# Patient Record
Sex: Female | Born: 1945 | Race: White | Hispanic: No | Marital: Married | Smoking: Current every day smoker
Health system: Southern US, Community
[De-identification: ages and names within clinical notes are randomized; demographics above are authoritative.]

## PROBLEM LIST (undated history)

## (undated) DIAGNOSIS — M1990 Unspecified osteoarthritis, unspecified site: Secondary | ICD-10-CM

## (undated) DIAGNOSIS — M545 Low back pain, unspecified: Secondary | ICD-10-CM

## (undated) DIAGNOSIS — I10 Essential (primary) hypertension: Secondary | ICD-10-CM

## (undated) DIAGNOSIS — I340 Nonrheumatic mitral (valve) insufficiency: Secondary | ICD-10-CM

## (undated) DIAGNOSIS — R599 Enlarged lymph nodes, unspecified: Secondary | ICD-10-CM

## (undated) DIAGNOSIS — F32A Depression, unspecified: Secondary | ICD-10-CM

## (undated) DIAGNOSIS — M5430 Sciatica, unspecified side: Secondary | ICD-10-CM

## (undated) DIAGNOSIS — Z720 Tobacco use: Secondary | ICD-10-CM

## (undated) DIAGNOSIS — I2510 Atherosclerotic heart disease of native coronary artery without angina pectoris: Secondary | ICD-10-CM

## (undated) DIAGNOSIS — E785 Hyperlipidemia, unspecified: Secondary | ICD-10-CM

## (undated) DIAGNOSIS — J449 Chronic obstructive pulmonary disease, unspecified: Secondary | ICD-10-CM

## (undated) DIAGNOSIS — K219 Gastro-esophageal reflux disease without esophagitis: Secondary | ICD-10-CM

## (undated) DIAGNOSIS — F329 Major depressive disorder, single episode, unspecified: Secondary | ICD-10-CM

## (undated) DIAGNOSIS — N3941 Urge incontinence: Secondary | ICD-10-CM

## (undated) DIAGNOSIS — C50919 Malignant neoplasm of unspecified site of unspecified female breast: Secondary | ICD-10-CM

## (undated) DIAGNOSIS — I509 Heart failure, unspecified: Secondary | ICD-10-CM

## (undated) DIAGNOSIS — I5020 Unspecified systolic (congestive) heart failure: Secondary | ICD-10-CM

## (undated) DIAGNOSIS — I6529 Occlusion and stenosis of unspecified carotid artery: Secondary | ICD-10-CM

## (undated) HISTORY — DX: Low back pain: M54.5

## (undated) HISTORY — DX: Unspecified systolic (congestive) heart failure: I50.20

## (undated) HISTORY — DX: Depression, unspecified: F32.A

## (undated) HISTORY — DX: Urge incontinence: N39.41

## (undated) HISTORY — DX: Nonrheumatic mitral (valve) insufficiency: I34.0

## (undated) HISTORY — DX: Malignant neoplasm of unspecified site of unspecified female breast: C50.919

## (undated) HISTORY — DX: Enlarged lymph nodes, unspecified: R59.9

## (undated) HISTORY — DX: Tobacco use: Z72.0

## (undated) HISTORY — PX: CHOLECYSTECTOMY: SHX55

## (undated) HISTORY — PX: CAROTID ENDARTERECTOMY: SUR193

## (undated) HISTORY — DX: Sciatica, unspecified side: M54.30

## (undated) HISTORY — DX: Unspecified osteoarthritis, unspecified site: M19.90

## (undated) HISTORY — DX: Atherosclerotic heart disease of native coronary artery without angina pectoris: I25.10

## (undated) HISTORY — DX: Hyperlipidemia, unspecified: E78.5

## (undated) HISTORY — PX: APPENDECTOMY: SHX54

## (undated) HISTORY — DX: Gastro-esophageal reflux disease without esophagitis: K21.9

## (undated) HISTORY — DX: Chronic obstructive pulmonary disease, unspecified: J44.9

## (undated) HISTORY — DX: Low back pain, unspecified: M54.50

## (undated) HISTORY — DX: Major depressive disorder, single episode, unspecified: F32.9

## (undated) HISTORY — PX: OTHER SURGICAL HISTORY: SHX169

## (undated) HISTORY — DX: Essential (primary) hypertension: I10

## (undated) HISTORY — PX: CERVICAL DISC SURGERY: SHX588

## (undated) HISTORY — DX: Occlusion and stenosis of unspecified carotid artery: I65.29

---

## 1990-06-08 HISTORY — PX: MASTECTOMY: SHX3

## 1993-06-08 HISTORY — PX: BACK SURGERY: SHX140

## 2007-07-31 ENCOUNTER — Ambulatory Visit: Payer: Self-pay | Admitting: Family Medicine

## 2007-07-31 ENCOUNTER — Observation Stay (HOSPITAL_COMMUNITY): Admission: EM | Admit: 2007-07-31 | Discharge: 2007-08-02 | Payer: Self-pay | Admitting: Emergency Medicine

## 2007-07-31 LAB — CONVERTED CEMR LAB: Pro B Natriuretic peptide (BNP): 902 pg/mL

## 2007-08-01 ENCOUNTER — Encounter (INDEPENDENT_AMBULATORY_CARE_PROVIDER_SITE_OTHER): Payer: Self-pay | Admitting: Emergency Medicine

## 2007-08-01 LAB — CONVERTED CEMR LAB
Cholesterol: 166 mg/dL
HIV: NEGATIVE
Triglycerides: 67 mg/dL

## 2007-08-02 LAB — CONVERTED CEMR LAB
Creatinine, Ser: 0.8 mg/dL
Sodium: 138 meq/L

## 2007-08-03 ENCOUNTER — Encounter: Payer: Self-pay | Admitting: Family Medicine

## 2007-08-10 ENCOUNTER — Telehealth: Payer: Self-pay | Admitting: *Deleted

## 2007-09-02 ENCOUNTER — Ambulatory Visit: Payer: Self-pay | Admitting: Family Medicine

## 2007-09-02 ENCOUNTER — Telehealth (INDEPENDENT_AMBULATORY_CARE_PROVIDER_SITE_OTHER): Payer: Self-pay | Admitting: *Deleted

## 2007-09-02 DIAGNOSIS — E785 Hyperlipidemia, unspecified: Secondary | ICD-10-CM | POA: Insufficient documentation

## 2007-09-02 DIAGNOSIS — J449 Chronic obstructive pulmonary disease, unspecified: Secondary | ICD-10-CM

## 2007-09-02 DIAGNOSIS — I10 Essential (primary) hypertension: Secondary | ICD-10-CM

## 2007-09-02 DIAGNOSIS — N3941 Urge incontinence: Secondary | ICD-10-CM

## 2007-09-02 DIAGNOSIS — J4489 Other specified chronic obstructive pulmonary disease: Secondary | ICD-10-CM | POA: Insufficient documentation

## 2007-09-02 DIAGNOSIS — I5032 Chronic diastolic (congestive) heart failure: Secondary | ICD-10-CM | POA: Insufficient documentation

## 2007-09-04 ENCOUNTER — Encounter: Payer: Self-pay | Admitting: Family Medicine

## 2007-09-04 DIAGNOSIS — K219 Gastro-esophageal reflux disease without esophagitis: Secondary | ICD-10-CM

## 2007-09-04 DIAGNOSIS — M545 Low back pain: Secondary | ICD-10-CM | POA: Insufficient documentation

## 2007-09-04 DIAGNOSIS — I5043 Acute on chronic combined systolic (congestive) and diastolic (congestive) heart failure: Secondary | ICD-10-CM | POA: Insufficient documentation

## 2007-09-04 DIAGNOSIS — F329 Major depressive disorder, single episode, unspecified: Secondary | ICD-10-CM

## 2007-09-04 DIAGNOSIS — M1990 Unspecified osteoarthritis, unspecified site: Secondary | ICD-10-CM | POA: Insufficient documentation

## 2007-09-04 DIAGNOSIS — I509 Heart failure, unspecified: Secondary | ICD-10-CM | POA: Insufficient documentation

## 2007-12-30 ENCOUNTER — Encounter: Payer: Self-pay | Admitting: Family Medicine

## 2007-12-30 ENCOUNTER — Ambulatory Visit: Payer: Self-pay | Admitting: Family Medicine

## 2008-01-02 LAB — CONVERTED CEMR LAB
AST: 13 units/L (ref 0–37)
Alkaline Phosphatase: 77 units/L (ref 39–117)
CO2: 23 meq/L (ref 19–32)
Chloride: 103 meq/L (ref 96–112)
Direct LDL: 94 mg/dL
Glucose, Bld: 97 mg/dL (ref 70–99)
Potassium: 3.8 meq/L (ref 3.5–5.3)
Sodium: 139 meq/L (ref 135–145)
Total Bilirubin: 0.3 mg/dL (ref 0.3–1.2)

## 2008-01-03 ENCOUNTER — Telehealth: Payer: Self-pay | Admitting: Family Medicine

## 2008-02-03 ENCOUNTER — Telehealth (INDEPENDENT_AMBULATORY_CARE_PROVIDER_SITE_OTHER): Payer: Self-pay | Admitting: *Deleted

## 2008-06-06 ENCOUNTER — Encounter: Payer: Self-pay | Admitting: Family Medicine

## 2008-07-19 ENCOUNTER — Ambulatory Visit: Payer: Self-pay | Admitting: Cardiovascular Disease

## 2008-07-19 ENCOUNTER — Inpatient Hospital Stay (HOSPITAL_COMMUNITY): Admission: EM | Admit: 2008-07-19 | Discharge: 2008-07-21 | Payer: Self-pay | Admitting: Emergency Medicine

## 2008-10-10 ENCOUNTER — Encounter: Payer: Self-pay | Admitting: *Deleted

## 2009-02-15 ENCOUNTER — Encounter: Payer: Self-pay | Admitting: Cardiovascular Disease

## 2009-02-18 ENCOUNTER — Encounter: Payer: Self-pay | Admitting: Cardiovascular Disease

## 2009-02-20 ENCOUNTER — Encounter: Payer: Self-pay | Admitting: Cardiovascular Disease

## 2009-02-20 ENCOUNTER — Ambulatory Visit: Payer: Self-pay | Admitting: Vascular Surgery

## 2009-02-23 ENCOUNTER — Encounter (INDEPENDENT_AMBULATORY_CARE_PROVIDER_SITE_OTHER): Payer: Self-pay | Admitting: *Deleted

## 2009-02-23 DIAGNOSIS — Z720 Tobacco use: Secondary | ICD-10-CM | POA: Insufficient documentation

## 2009-03-05 ENCOUNTER — Ambulatory Visit: Payer: Self-pay | Admitting: Vascular Surgery

## 2009-03-05 ENCOUNTER — Encounter: Payer: Self-pay | Admitting: Vascular Surgery

## 2009-03-05 ENCOUNTER — Inpatient Hospital Stay (HOSPITAL_COMMUNITY): Admission: RE | Admit: 2009-03-05 | Discharge: 2009-03-06 | Payer: Self-pay | Admitting: Vascular Surgery

## 2009-03-05 IMAGING — CR DG CHEST 1V PORT
1 series · 1 of 1 positions shown · non-contrast
Comparison: None.

CLINICAL DATA: Shortness of breath. 
 PORTABLE CHEST - 1 VIEW:

[AP]
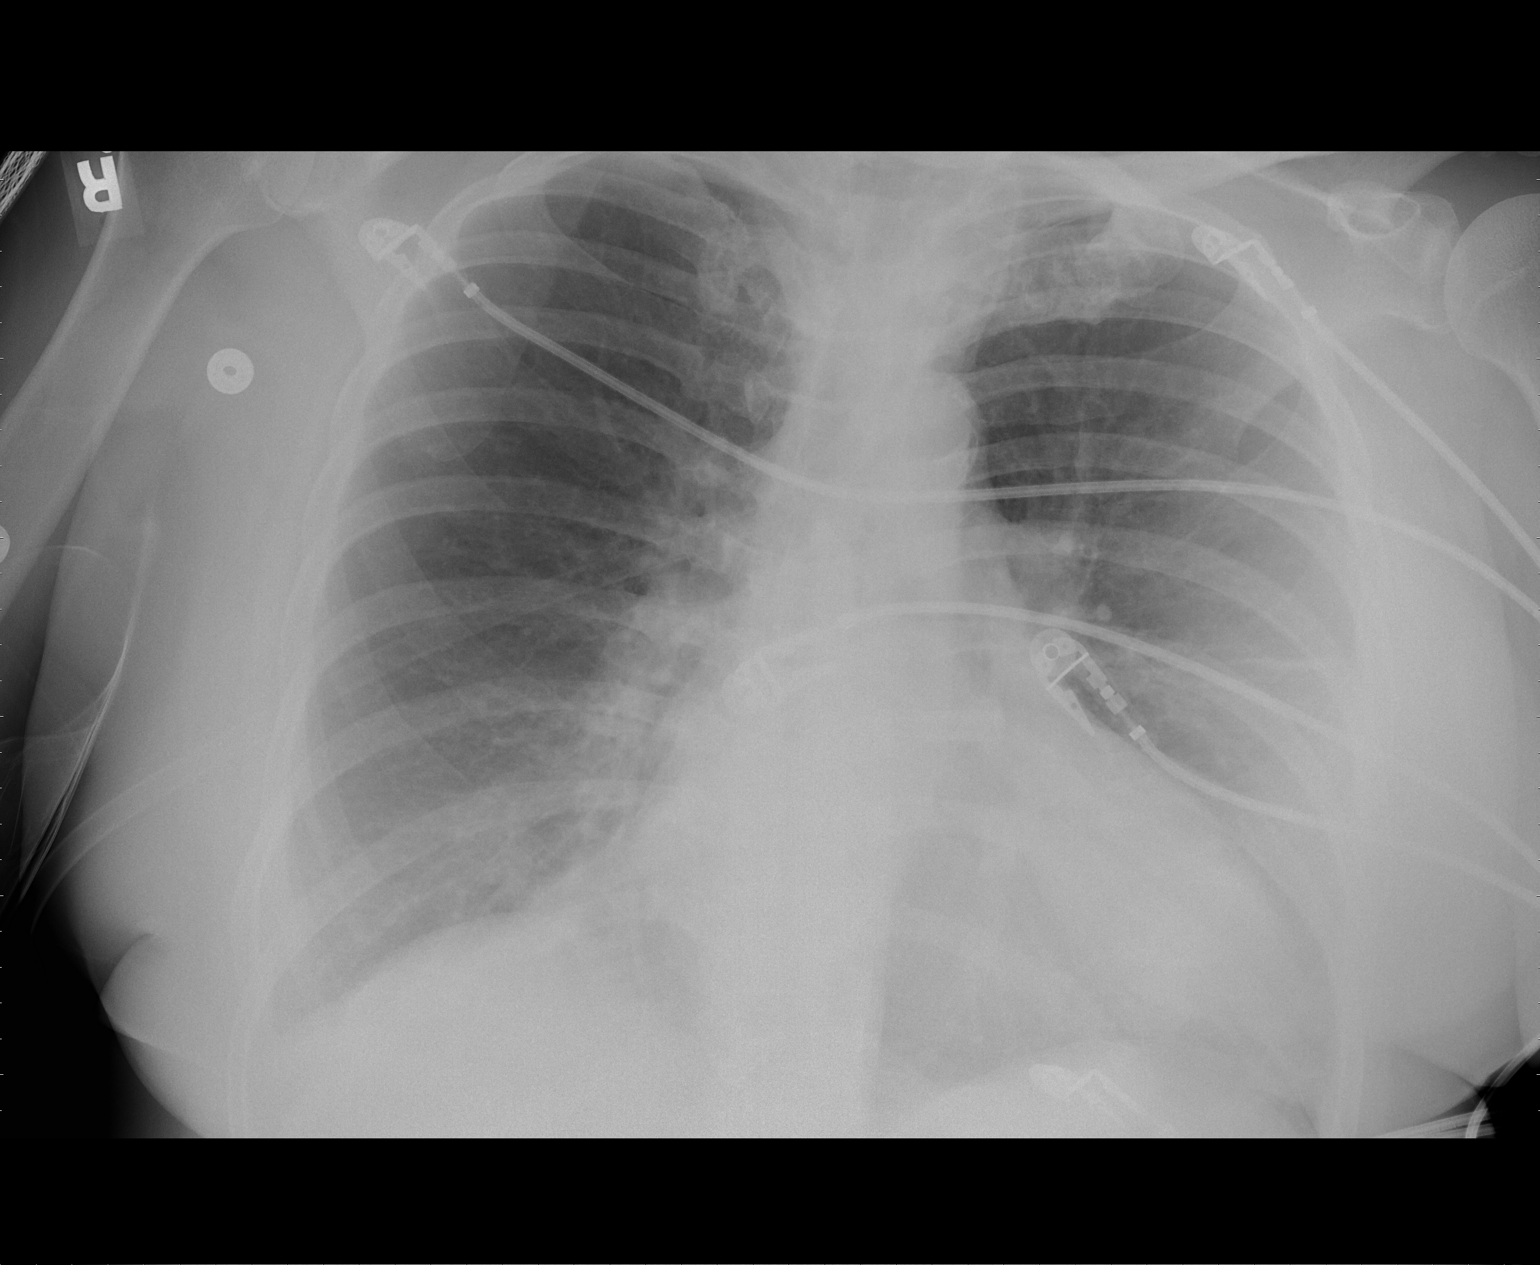

[1 of 1 positions shown; findings below may reference images not displayed]

FINDINGS: The heart is enlarged.  There is mild pulmonary edema and probable small effusions. No definite infiltrates.
IMPRESSION: Mild CHF.

## 2009-03-05 IMAGING — CT CT ANGIO CHEST
2 of 5 series · 19 of 36 positions shown · IV contrast (APPLIED)
Comparison: none

CLINICAL DATA: Fever.  Shortness of breath.  Reason for exam:  Query PE.  
CT ANGIOGRAPHY OF CHEST:
TECHNIQUE: Multidetector CT imaging of the chest was performed during bolus injection of intravenous contrast.  Multiplanar CT angiographic image reconstructions were generated to evaluate the vascular anatomy.
Contrast:  100 cc Omnipaque 300

[Series 7: pulm embolism 1.0 thins · axial · 0.63mm/px · z∈[+1121,+1357]mm · 16 of 264 slices shown]
[im 14/264  lung]
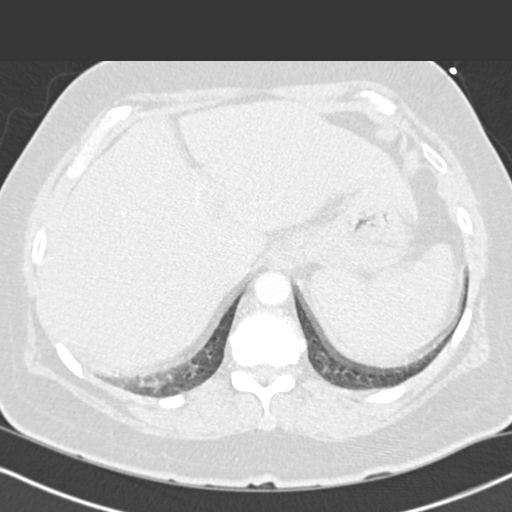
[im 28/264  mediastinal]
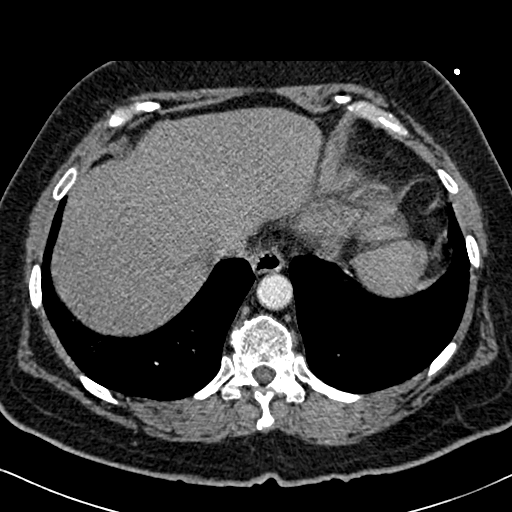
[im 42/264  lung]
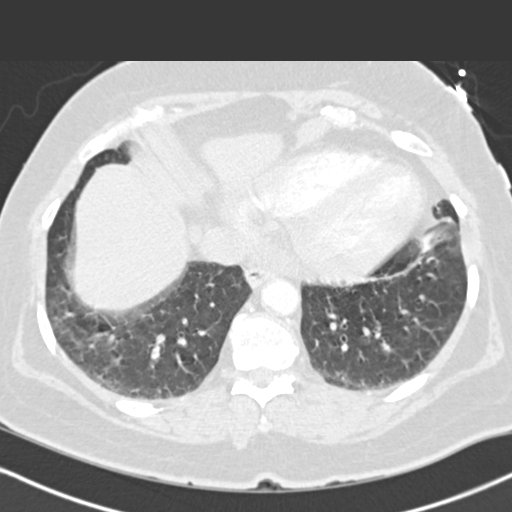
[im 56/264  mediastinal]
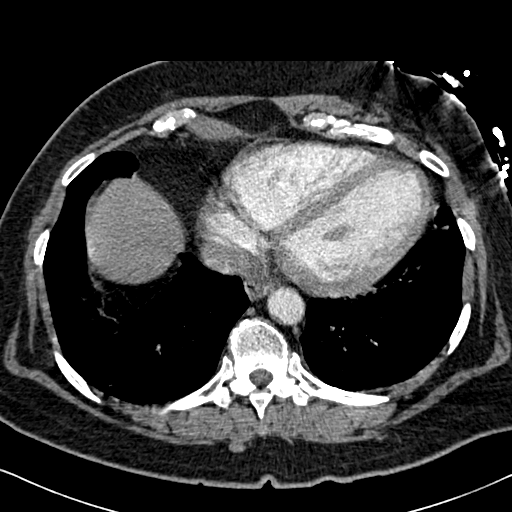
[im 84/264  lung]
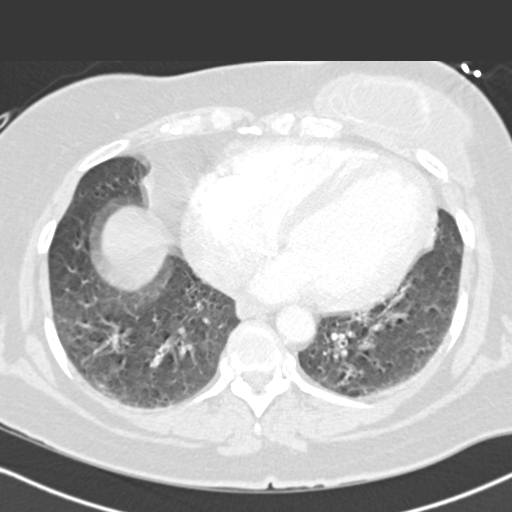
[im 97/264  mediastinal]
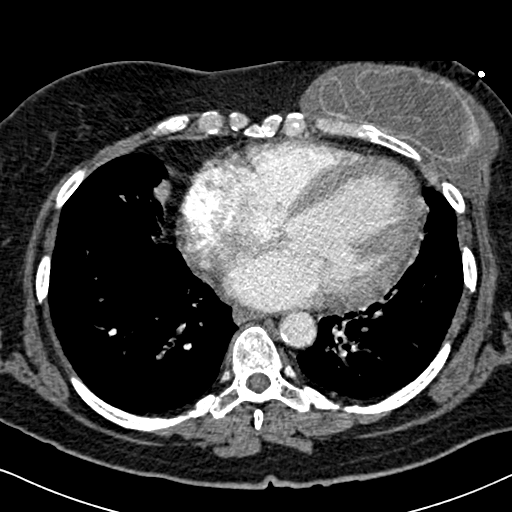
[im 111/264  lung]
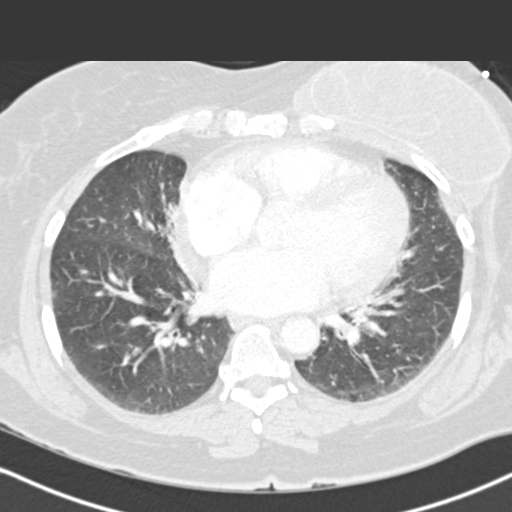
[im 125/264  mediastinal]
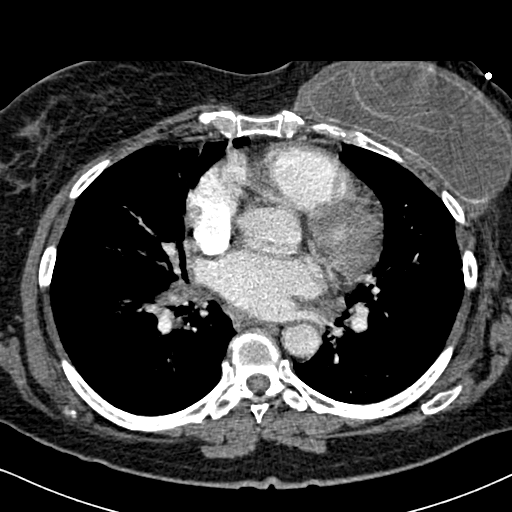
[im 139/264  lung]
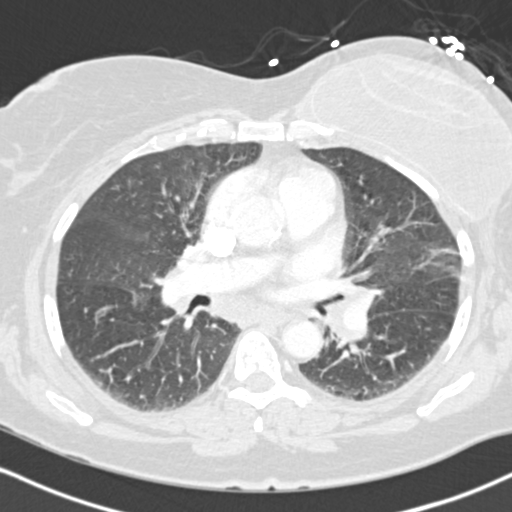
[im 153/264  mediastinal]
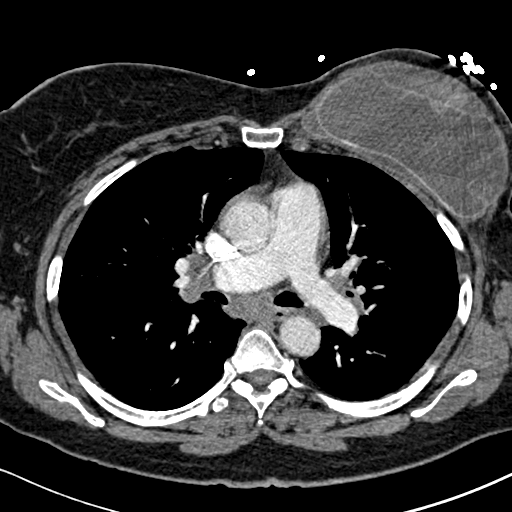
[im 167/264  lung]
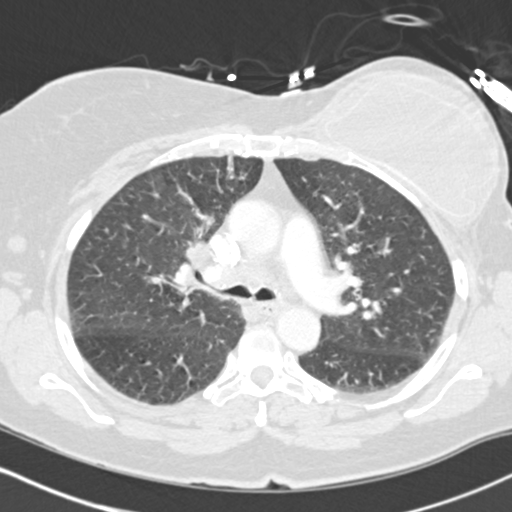
[im 180/264  mediastinal]
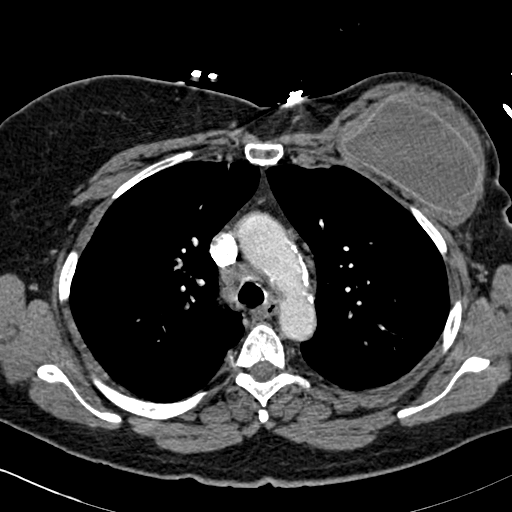
[im 208/264  lung]
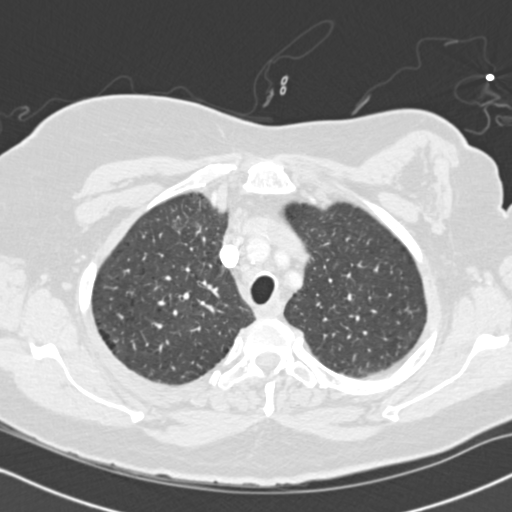
[im 222/264  mediastinal]
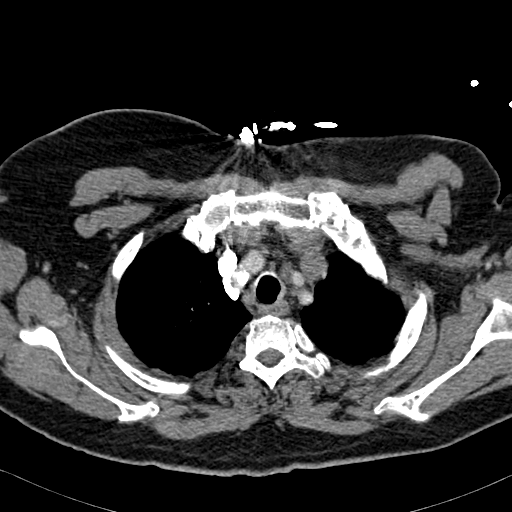
[im 236/264  lung]
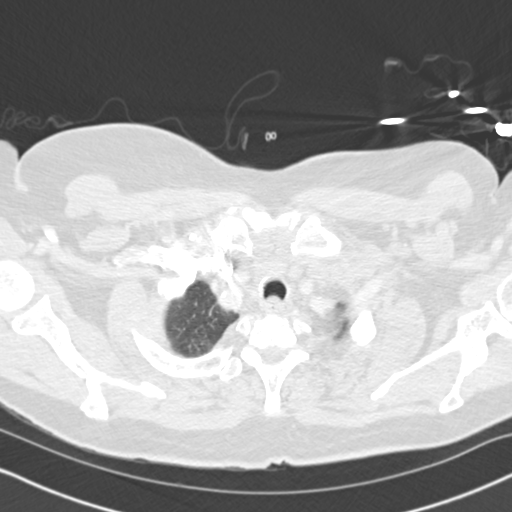
[im 250/264  mediastinal]
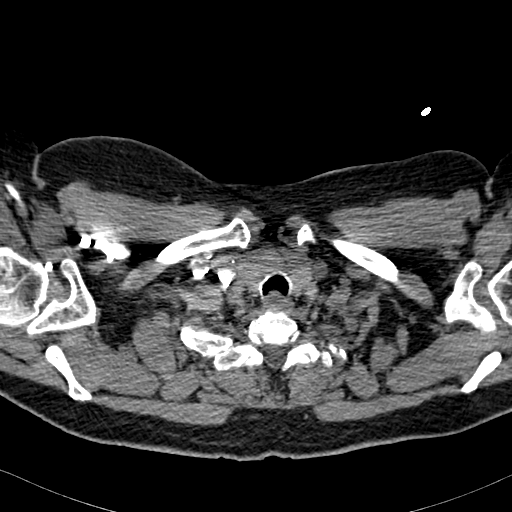

[Series 9: pulm embolism 2.0 cor · coronal · 0.67mm/px · 3 of 124 slices shown]
[im 25/124  mediastinal]
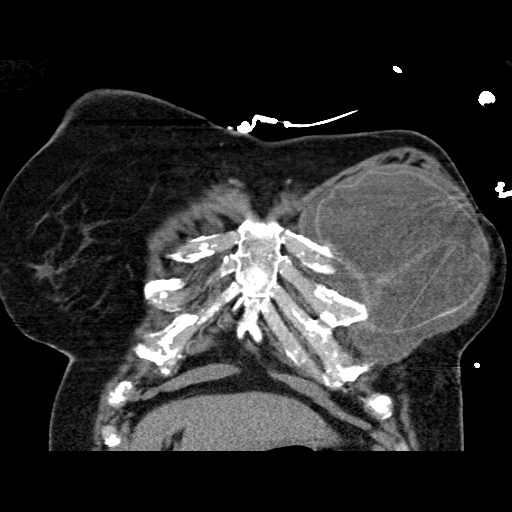
[im 50/124  mediastinal]
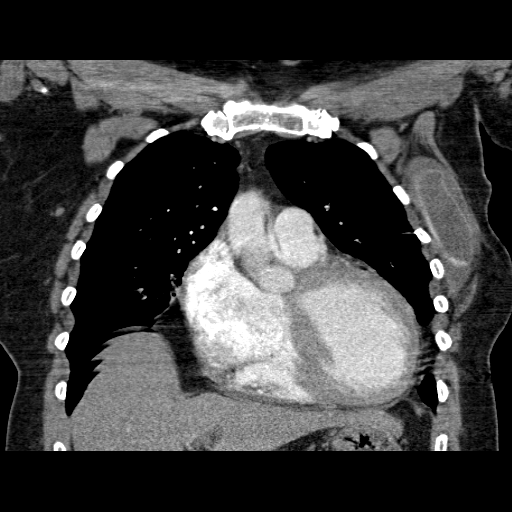
[im 74/124  mediastinal]
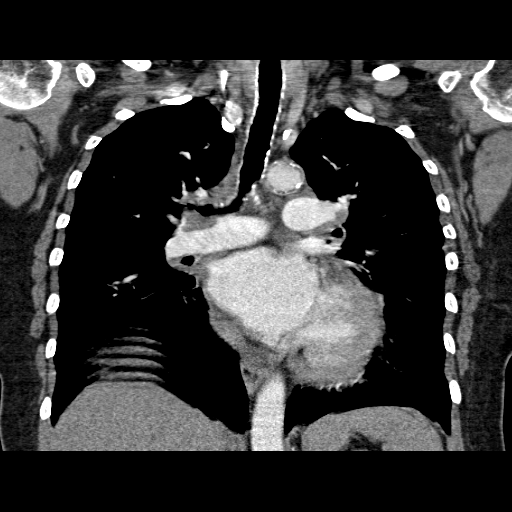

[19 of 36 positions shown; findings below may reference images not displayed]

FINDINGS: Negative for PE.  Left breast implant with findings consistent with intracapsular rupture.  Cardiomegaly.  No pericardial or pleural effusions.  Linear scarring versus subsegmental atelectasis in the right middle lobe.  Mild diffuse increase in interstitial/peribronchial markings.  No focal infiltrate or consolidation.  Mediastinal and bihilar adenopathy, of undetermined etiology.  Sarcoidosis would be a consideration.  Slightly prominent sized nodes are noted in the superior mediastinum, right paratracheal region and precarinal, subcarinal, and prevascular territories.  Just lateral to the aortopulmonary window there is adenopathy involving the prevascular region.  There are two adjacent nodes the largest having a short axis diameter of 1 cm.  There is probably a cluster of prominent nodes in the subcarinal region.
IMPRESSION: Negative for PE.  Accentuated pulmonary interstitial/peribronchial markings.  No focal infiltrate or consolidation.  Mediastinal and bihilar adenopathy.  Abnormal appearing left breast prosthesis.  Findings suggesting intracapsular rupture.

## 2009-03-20 ENCOUNTER — Ambulatory Visit: Payer: Self-pay | Admitting: Vascular Surgery

## 2009-03-20 ENCOUNTER — Encounter: Payer: Self-pay | Admitting: Cardiovascular Disease

## 2009-03-26 ENCOUNTER — Ambulatory Visit: Payer: Self-pay | Admitting: Cardiovascular Disease

## 2009-03-26 DIAGNOSIS — I340 Nonrheumatic mitral (valve) insufficiency: Secondary | ICD-10-CM | POA: Insufficient documentation

## 2009-03-26 DIAGNOSIS — R0989 Other specified symptoms and signs involving the circulatory and respiratory systems: Secondary | ICD-10-CM

## 2009-03-26 DIAGNOSIS — I2510 Atherosclerotic heart disease of native coronary artery without angina pectoris: Secondary | ICD-10-CM | POA: Insufficient documentation

## 2009-03-26 DIAGNOSIS — I080 Rheumatic disorders of both mitral and aortic valves: Secondary | ICD-10-CM

## 2009-03-26 LAB — CONVERTED CEMR LAB: Sodium: 132 meq/L — ABNORMAL LOW (ref 135–145)

## 2009-04-29 ENCOUNTER — Ambulatory Visit: Payer: Self-pay | Admitting: Pulmonary Disease

## 2009-04-29 DIAGNOSIS — R0602 Shortness of breath: Secondary | ICD-10-CM | POA: Insufficient documentation

## 2009-05-02 ENCOUNTER — Encounter (INDEPENDENT_AMBULATORY_CARE_PROVIDER_SITE_OTHER): Payer: Self-pay | Admitting: *Deleted

## 2009-06-26 ENCOUNTER — Ambulatory Visit: Payer: Self-pay | Admitting: Pulmonary Disease

## 2009-06-26 DIAGNOSIS — J438 Other emphysema: Secondary | ICD-10-CM

## 2009-08-08 ENCOUNTER — Encounter (INDEPENDENT_AMBULATORY_CARE_PROVIDER_SITE_OTHER): Payer: Self-pay | Admitting: *Deleted

## 2009-08-28 ENCOUNTER — Ambulatory Visit: Payer: Self-pay | Admitting: Pulmonary Disease

## 2009-09-12 ENCOUNTER — Ambulatory Visit: Payer: Self-pay | Admitting: Cardiovascular Disease

## 2009-09-25 ENCOUNTER — Ambulatory Visit: Payer: Self-pay | Admitting: Vascular Surgery

## 2009-10-08 ENCOUNTER — Telehealth: Payer: Self-pay | Admitting: Cardiovascular Disease

## 2009-10-31 ENCOUNTER — Inpatient Hospital Stay (HOSPITAL_COMMUNITY): Admission: EM | Admit: 2009-10-31 | Discharge: 2009-11-02 | Payer: Self-pay | Admitting: Emergency Medicine

## 2009-11-13 ENCOUNTER — Encounter: Payer: Self-pay | Admitting: Cardiovascular Disease

## 2009-11-13 ENCOUNTER — Inpatient Hospital Stay (HOSPITAL_COMMUNITY): Admission: EM | Admit: 2009-11-13 | Discharge: 2009-11-17 | Payer: Self-pay | Admitting: Emergency Medicine

## 2009-11-13 ENCOUNTER — Ambulatory Visit: Payer: Self-pay | Admitting: Cardiology

## 2009-11-14 ENCOUNTER — Encounter (INDEPENDENT_AMBULATORY_CARE_PROVIDER_SITE_OTHER): Payer: Self-pay | Admitting: Internal Medicine

## 2009-11-14 ENCOUNTER — Encounter: Payer: Self-pay | Admitting: Cardiovascular Disease

## 2009-11-15 ENCOUNTER — Encounter (INDEPENDENT_AMBULATORY_CARE_PROVIDER_SITE_OTHER): Payer: Self-pay | Admitting: Internal Medicine

## 2009-12-03 ENCOUNTER — Telehealth: Payer: Self-pay | Admitting: Cardiovascular Disease

## 2010-01-29 ENCOUNTER — Ambulatory Visit: Payer: Self-pay | Admitting: Pulmonary Disease

## 2010-02-12 ENCOUNTER — Telehealth: Payer: Self-pay | Admitting: Cardiovascular Disease

## 2010-02-25 ENCOUNTER — Encounter: Payer: Self-pay | Admitting: Family Medicine

## 2010-04-18 ENCOUNTER — Telehealth: Payer: Self-pay | Admitting: Cardiovascular Disease

## 2010-04-18 ENCOUNTER — Telehealth (INDEPENDENT_AMBULATORY_CARE_PROVIDER_SITE_OTHER): Payer: Self-pay | Admitting: *Deleted

## 2010-05-02 ENCOUNTER — Ambulatory Visit: Payer: Self-pay

## 2010-05-02 ENCOUNTER — Ambulatory Visit: Payer: Self-pay | Admitting: Cardiology

## 2010-05-02 ENCOUNTER — Encounter: Payer: Self-pay | Admitting: Cardiovascular Disease

## 2010-05-02 ENCOUNTER — Ambulatory Visit (HOSPITAL_COMMUNITY): Admission: RE | Admit: 2010-05-02 | Discharge: 2010-05-02 | Payer: Self-pay | Admitting: Cardiovascular Disease

## 2010-05-15 ENCOUNTER — Telehealth: Payer: Self-pay | Admitting: Cardiovascular Disease

## 2010-05-22 ENCOUNTER — Encounter (INDEPENDENT_AMBULATORY_CARE_PROVIDER_SITE_OTHER): Payer: Self-pay | Admitting: *Deleted

## 2010-05-23 ENCOUNTER — Ambulatory Visit: Payer: Self-pay | Admitting: Vascular Surgery

## 2010-05-28 ENCOUNTER — Ambulatory Visit: Payer: Self-pay | Admitting: Cardiovascular Disease

## 2010-05-29 ENCOUNTER — Encounter: Payer: Self-pay | Admitting: Family Medicine

## 2010-06-30 ENCOUNTER — Encounter: Payer: Self-pay | Admitting: Family Medicine

## 2010-07-08 NOTE — Assessment & Plan Note (Signed)
Summary: rov for emphysema   Copy to:  Charlton Haws Primary Provider/Referring Provider:  Lajuana Matte at [facility]  CC:  Pt is here for a 4 month f/u appt on her emphysema. Pt states she has been in the hosp x 2 since she was last seen for dizziness.  Pt denied any complaints with sob.  states she will occ cough up clear to brown colored sputum.  pt is still smoking 1/2 ppd. .  History of Present Illness: The pt comes in today for f/u of her known emphysema.  She has been in the hospital times two for unrelated issues, and her breathing surprisingly has been stable.  She is staying on her bronchodilator regimen, but unfortunately is still smoking.  She has an am cough that is productive of white to discolored mucus, but clears after she starts her day.    Current Medications (verified): 1)  Ventolin Hfa 108 (90 Base) Mcg/act  Aers (Albuterol Sulfate) .... 2 Puffs Q 4hours As Needed Shortness of Breath 2)  Carvedilol 6.25 Mg Tabs (Carvedilol) .... Take One Tablet By Mouth Twice A Day 3)  Lisinopril 20 Mg Tabs (Lisinopril) .... Take One Tablet By Mouth Daily 4)  Aspirin Low Dose 81 Mg Tabs (Aspirin) .... Take 1 Tablet By Mouth Once A Day 5)  Simvastatin 80 Mg Tabs (Simvastatin) .... Take One Tablet By Mouth Daily At Bedtime 6)  Furosemide 40 Mg Tabs (Furosemide) .... Take One Tablet By Mouth Daily. 7)  Cymbalta 20 Mg Cpep (Duloxetine Hcl) .Marland Kitchen.. 1 Tab By Mouth Once Daily 8)  Spiriva Handihaler 18 Mcg  Caps (Tiotropium Bromide Monohydrate) .... One Puff  in Handihaler Daily 9)  Symbicort 160-4.5 Mcg/act  Aero (Budesonide-Formoterol Fumarate) .... Two Puffs Twice Daily 10)  Nitrostat 0.4 Mg Subl (Nitroglycerin) .Marland Kitchen.. 1 Tablet Under Tongue At Onset of Chest Pain; You May Repeat Every 5 Minutes For Up To 3 Doses. 11)  Pantoprazole Sodium 40 Mg Tbec (Pantoprazole Sodium) .... Take 1 Tablet By Mouth Once A Day 12)  Hydrocodone-Acetaminophen 5-500 Mg Tabs (Hydrocodone-Acetaminophen) ....  Take 1 To 2 Tabs By Mouth Every 4 To 6 Hours As Needed  Allergies (verified): No Known Drug Allergies  Social History: LIves with daughter, Aggie Cosier and grandson, Ephriam Knuckles, and her husband Zollie Beckers.   She is unemployed now.  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   Pt is a current smoker.  started at age 89.  1 ppd.  currently down to 1/2 ppd.  Denies alcohol, denies drugs.  Review of Systems       The patient complains of productive cough, weight change, nasal congestion/difficulty breathing through nose, sneezing, itching, and hand/feet swelling.  The patient denies shortness of breath with activity, shortness of breath at rest, non-productive cough, coughing up blood, chest pain, irregular heartbeats, acid heartburn, indigestion, loss of appetite, abdominal pain, difficulty swallowing, sore throat, tooth/dental problems, headaches, ear ache, anxiety, depression, joint stiffness or pain, rash, change in color of mucus, and fever.    Vital Signs:  Patient profile:   65 year old female Height:      67 inches Weight:      173.13 pounds BMI:     27.21 O2 Sat:      98 % on Room air Temp:     99.1 degrees F oral Pulse rate:   61 / minute BP sitting:   120 / 78  (left arm) Cuff size:  large  Vitals Entered By: Arman Filter LPN (January 29, 2010 3:39 PM)  O2 Flow:  Room air CC: Pt is here for a 4 month f/u appt on her emphysema. Pt states she has been in the hosp x 2 since she was last seen for dizziness.  Pt denied any complaints with sob.  states she will occ cough up clear to brown colored sputum.  pt is still smoking 1/2 ppd.  Comments Medications reviewed with patient Arman Filter LPN  January 29, 2010 3:39 PM    Physical Exam  General:  wd female in nad Lungs:  decreased bs throughout, but no wheezing or rhonchi Heart:  rrr Extremities:  no edema noted, no cyanosis Neurologic:  alert and oriented, moves all 4.   Impression &  Recommendations:  Problem # 1:  EMPHYSEMA (ICD-492.8) the pt is being treated with a good bronchodilator regimen, and has not had any worsening in her exertional tolerance since the last visit.  I have stressed to her again the importance of total smoking cessation, and she will try.   She will followup with me in 6mos.   Medications Added to Medication List This Visit: 1)  Aspirin Low Dose 81 Mg Tabs (Aspirin) .... Take 1 tablet by mouth once a day 2)  Pantoprazole Sodium 40 Mg Tbec (Pantoprazole sodium) .... Take 1 tablet by mouth once a day 3)  Hydrocodone-acetaminophen 5-500 Mg Tabs (Hydrocodone-acetaminophen) .... Take 1 to 2 tabs by mouth every 4 to 6 hours as needed 4)  Proair Hfa 108 (90 Base) Mcg/act Aers (Albuterol sulfate) .... 2 puffs every 4-6 hours as needed  Other Orders: Est. Patient Level III (65784)  Patient Instructions: 1)  stop smoking! 2)  continue current breathing medicines, except will change your rescue inhaler to proair (for insurance purposes) 3)  followup with me in 6mos.   Prescriptions: PROAIR HFA 108 (90 BASE) MCG/ACT  AERS (ALBUTEROL SULFATE) 2 puffs every 4-6 hours as needed  #1 x 6   Entered and Authorized by:   Barbaraann Share MD   Signed by:   Barbaraann Share MD on 01/29/2010   Method used:   Print then Give to Patient   RxID:   [id number]

## 2010-07-08 NOTE — Letter (Signed)
Summary: Appointment - Reminder 2  [facility], Main Office  [address]   [place], [place] [address]   Phone: [phone]  Fax: [phone]     August 08, 2009 MRN: [id number]   [facility] [address] [place], [place]  [address]   Dear Ms. Tauer,  Our records indicate that it is time to schedule a follow-up appointment with Dr. Eden Emms. It is very important that we reach you to schedule this appointment. We look forward to participating in your health care needs. Please contact us at the number listed above at your earliest convenience to schedule your appointment.  If you are unable to make an appointment at this time, give [place] a call so we can update our records.     Sincerely,   Migdalia Dk [facility] Scheduling Team

## 2010-07-08 NOTE — Miscellaneous (Signed)
Summary: Orders Update pft charges  Clinical Lists Changes  Orders: Added new Service order of Carbon Monoxide diffusing w/capacity (94720) - Signed Added new Service order of Lung Volumes (94240) - Signed Added new Service order of Spirometry (Pre & Post) (94060) - Signed 

## 2010-07-08 NOTE — Progress Notes (Signed)
Summary: question on meds  Phone Note Call from Patient   Caller: Patient Reason for Call: Talk to Nurse Summary of Call: pt is going to the dentist next week. pt wants to know if she needs to stop her plavix.  Initial call taken by: Roe Coombs,  May 15, 2010 2:50 PM  Follow-up for Phone Call        spoke with pt, she needs to have some teeth extracted at [facility] dental care and needs to know if ok to hold plavix prior to procedure. pt had BMS placed in 07/2008. will foward for dr Eden Emms review Deliah Goody, RN  May 15, 2010 3:29 PM   Additional Follow-up for Phone Call Additional follow up Details #1::        She can hold if if needed Additional Follow-up by: Colon Branch, MD, [facility],  May 16, 2010 9:17 AM    Additional Follow-up for Phone Call Additional follow up Details #2::    pt aware Deliah Goody, RN  May 16, 2010 6:07 PM

## 2010-07-08 NOTE — Assessment & Plan Note (Signed)
Summary: rov for review of pfts   Copy to:  Charlton Haws Primary Provider/Referring Provider:  Lajuana Matte at [facility]  CC:  Pt is here for a f/u appt to discuss PFT results.  Pt is still smoking 1 ppd. .  History of Present Illness: The pt comes in today for f/u of her pfts.  This revealed moderate airflow obstruction, no response to bronchodilators, no restriction, definite airtrapping, and finally moderate reduction in DLCO with correction to normal with alveolar volume adjustment.  I have gone over the study in detail with the patient, and answered all of her questions.  Current Medications (verified): 1)  Ventolin Hfa 108 (90 Base) Mcg/act  Aers (Albuterol Sulfate) .... 2 Puffs Q 4hours As Needed Shortness of Breath 2)  Carvedilol 6.25 Mg Tabs (Carvedilol) .... Take One Tablet By Mouth Twice A Day 3)  Lisinopril 20 Mg Tabs (Lisinopril) .... Take One Tablet By Mouth Daily 4)  Aspirin Ec 325 Mg Tbec (Aspirin) .... Take One Tablet By Mouth Daily 5)  Simvastatin 80 Mg Tabs (Simvastatin) .... Take One Tablet By Mouth Daily At Bedtime 6)  Plavix 75 Mg Tabs (Clopidogrel Bisulfate) .... Take One Tablet By Mouth Daily 7)  Furosemide 40 Mg Tabs (Furosemide) .... Take One Tablet By Mouth Daily. 8)  Cymbalta 20 Mg Cpep (Duloxetine Hcl) .Marland Kitchen.. 1 Tab By Mouth Once Daily 9)  Potassium Chloride Crys Cr 20 Meq Cr-Tabs (Potassium Chloride Crys Cr) .Marland Kitchen.. 1 Tab By Mouth Once Daily  Allergies (verified): No Known Drug Allergies  Vital Signs:  Patient profile:   65 year old female Height:      67 inches Weight:      166 pounds BMI:     26.09 O2 Sat:      99 % on Room air Temp:     98.2 degrees F oral Pulse rate:   58 / minute BP sitting:   118 / 60  (left arm) Cuff size:   regular  Vitals Entered By: Arman Filter LPN (June 26, 2009 3:55 PM)  O2 Flow:  Room air CC: Pt is here for a f/u appt to discuss PFT results.  Pt is still smoking 1 ppd.  Comments Medications reviewed with  patient Arman Filter LPN  June 26, 2009 3:59 PM    Physical Exam  General:  wd female in nad   Impression & Recommendations:  Problem # 1:  EMPHYSEMA (ICD-492.8)  the pt has moderate airflow obstruction that I suspect is due to emphysema and ongoing airway inflammation related to smoking.  I have had a long discussion with her about emphysema, including the role of smoking cessation, bronchodilators, and an exercise regimen.  I would like to start her on spiriva/symbicort, but also told her that no medication in the world will make her better if she continues to smoke.  I have also discussed the possibility of pulmonary rehab, but she has declined.  She will try to walk on a treadmill on her own.  Time spent with pt today was  Medications Added to Medication List This Visit: 1)  Spiriva Handihaler 18 Mcg Caps (Tiotropium bromide monohydrate) .... One puff  in handihaler daily 2)  Symbicort 160-4.5 Mcg/act Aero (Budesonide-formoterol fumarate) .... Two puffs twice daily  Other Orders: Est. Patient Level III (09811)  Patient Instructions: 1)  symbicort 160/4.5  2 inhalations am and pm everyday....rinse mouth well. 2)  spiriva one inhalation every morning only 3)  can use ventolin for emergencies  only 4)  stop smoking 5)  regular exercise at least 3 times a week 6)  followup with me in 2 mos   Prescriptions: SYMBICORT 160-4.5 MCG/ACT  AERO (BUDESONIDE-FORMOTEROL FUMARATE) Two puffs twice daily  #1 x 6   Entered and Authorized by:   Barbaraann Share MD   Signed by:   Barbaraann Share MD on 06/26/2009   Method used:   Print then Give to Patient   RxID:   [id number] SPIRIVA HANDIHALER 18 MCG  CAPS (TIOTROPIUM BROMIDE MONOHYDRATE) one puff  in handihaler daily  #30 x 6   Entered and Authorized by:   Barbaraann Share MD   Signed by:   Barbaraann Share MD on 06/26/2009   Method used:   Print then Give to Patient   RxID:   [id number]    Immunization  History:  Influenza Immunization History:    Influenza:  historical (06/08/2009)  Pneumovax Immunization History:    Pneumovax:  historical (07/10/2007)

## 2010-07-08 NOTE — Progress Notes (Signed)
Summary: when can she go back on plavix  Phone Note Call from Patient   Caller: Patient [phone] Reason for Call: Talk to Nurse Summary of Call: pt wants to know when she can go back on plavix-pls call [phone] Initial call taken by: Glynda Jaeger,  December 03, 2009 2:45 PM

## 2010-07-08 NOTE — Progress Notes (Signed)
Summary: restriction of food,water  Phone Note Call from Patient Call back at Home Phone [phone]   Caller: Patient Reason for Call: Talk to Nurse Summary of Call: per pt calling, pt was told she didn't have any restriction, does that included food, water.  Initial call taken by: Lorne Skeens,  Oct 08, 2009 2:45 PM  Follow-up for Phone Call        spoke with pt, questions answered Deliah Goody, RN  Oct 08, 2009 4:02 PM

## 2010-07-08 NOTE — Progress Notes (Signed)
Summary: talk to nurse  Phone Note From Other Clinic   Caller: Provider Summary of Call: Per Dr [name] her ulcers have healed and wants to know if it is ok for her to restart plavix. ofc [phone] Initial call taken by: [name],  February 12, 2010 4:22 PM  Follow-up for Phone Call        will foward for dr Eden Emms review Carolyn Goody, RN  February 12, 2010 4:41 PM   Additional Follow-up for Phone Call Additional follow up Details #1::        She is a vasculopath with BMS  if no issues with bleeding restart Plavix Additional Follow-up by: Carolyn Branch, MD, [facility],  February 13, 2010 1:27 PM    Additional Follow-up for Phone Call Additional follow up Details #2::    spoke with pt, she is aware to restart plaix Carolyn Goody, RN  February 13, 2010 2:26 PM

## 2010-07-08 NOTE — Progress Notes (Signed)
  Phone Note Outgoing Call   Call placed by: Scherrie Bateman, LPN,  April 18, 2010 8:35 AM Call placed to: Patient Summary of Call: LEFT MESSAGE FOR  PT TO CALL BACK RE APPT ECHO NOT DONE NEEDS TO RESCHEDULE APPT./CY Initial call taken by: Scherrie Bateman, LPN,  April 18, 2010 8:35 AM  Follow-up for Phone Call        Phone call completed PT TO CALL BACK AND RESCHEDULE APPT WITH DR Eden Emms  NEEDS ECHO DONE PRIOR NOT ONE SCHEDULED PREVIOUS TO THIS APPT./CY Follow-up by: Scherrie Bateman, LPN,  April 18, 2010 10:30 AM

## 2010-07-08 NOTE — Letter (Signed)
Summary: Discharge Summary  Discharge Summary   Imported By: Debby Freiberg 12/12/2009 11:44:26  _____________________________________________________________________  External Attachment:    Type:   Image     Comment:   External Document

## 2010-07-08 NOTE — Assessment & Plan Note (Signed)
Summary: 6 month rov/sl   Visit Type:  6 mo f/u Referring Provider:  Charlton Haws Primary Provider:  Lajuana Matte at [facility]  CC:  edema/face/eye..denies any cp or sob.  History of Present Illness: [name] is a previous patient of Dr Elease Hashimoto.   She had a BMS placed to the RCA in February 2010.  I could not find a cath report in echart.  She subsequently had a left CEA by Dr. Darrick Penna in September 2010.  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  she has not tried to quit.  We discussed this at length.  She is willing to try Chantix and already has a script I gave her last time.  She was referred r to pulmonary.  She saw Dr Shelle Iron and was started on Spireva.  PFT;s showed moderate obstruction, moderate decrease in DLCO and no bronchodilater response.   She got a pneumo vax in the hospital but is hesitant to get a flu shot.  She has not had any recurrent SSCP, palpitations, or edema.  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  She has not had a cough or sputum production.    She also appears to have chronic pain syndrome.  She does not work.  CRF include elevated lipids, HTN and smoking.  She has clinical Raynaud's disease with some upper extremity symptoms and blanching of the hands in cold weather. She needs a new script for nitro that we will call into [facility] in [place]    Current Problems (verified): 1)  Emphysema  (ICD-492.8) 2)  Dyspnea  (ICD-786.05) 3)  Hyperlipidemia  (ICD-272.4) 4)  Hypertension  (ICD-401.9) 5)  Congestive Heart Failure  (ICD-428.0) 6)  Congestive Heart Failure, Systolic Dysfunction  (ICD-428.20) 7)  Hypertension, Benign Essential  (ICD-401.1) 8)  Mitral Regurgitation  (ICD-396.3) 9)  Cad  (ICD-414.00) 10)  Carotid Artery Stenosis  (ICD-433.10) 11)  Tobacco User  (ICD-305.1) 12)  Enlargement of Lymph Nodes  (ICD-785.6) 13)  Osteoarthritis  (ICD-715.90) 14)  Low Back Pain  (ICD-724.2) 15)  Gerd  (ICD-530.81) 16)  Depression  (ICD-311) 17)   Breast Cancer, Hx of  (ICD-V10.3) 18)  Urinary Incontinence, Urge  (ICD-788.31) 19)  Sciatica  (ICD-724.3) 20)  Chronic Obstructive Pulmonary Disease  (ICD-496)  Current Medications (verified): 1)  Ventolin Hfa 108 (90 Base) Mcg/act  Aers (Albuterol Sulfate) .... 2 Puffs Q 4hours As Needed Shortness of Breath 2)  Carvedilol 6.25 Mg Tabs (Carvedilol) .... Take One Tablet By Mouth Twice A Day 3)  Lisinopril 20 Mg Tabs (Lisinopril) .... Take One Tablet By Mouth Daily 4)  Aspirin Ec 325 Mg Tbec (Aspirin) .... Take One Tablet By Mouth Daily 5)  Simvastatin 80 Mg Tabs (Simvastatin) .... Take One Tablet By Mouth Daily At Bedtime 6)  Plavix 75 Mg Tabs (Clopidogrel Bisulfate) .... Take One Tablet By Mouth Daily 7)  Furosemide 40 Mg Tabs (Furosemide) .... Take One Tablet By Mouth Daily. 8)  Cymbalta 20 Mg Cpep (Duloxetine Hcl) .Marland Kitchen.. 1 Tab By Mouth Once Daily 9)  Potassium Chloride Crys Cr 20 Meq Cr-Tabs (Potassium Chloride Crys Cr) .Marland Kitchen.. 1 Tab By Mouth Once Daily 10)  Spiriva Handihaler 18 Mcg  Caps (Tiotropium Bromide Monohydrate) .... One Puff  in Handihaler Daily 11)  Symbicort 160-4.5 Mcg/act  Aero (Budesonide-Formoterol Fumarate) .... Two Puffs Twice Daily 12)  Nitrostat 0.4 Mg Subl (Nitroglycerin) .Marland Kitchen.. 1 Tablet Under Tongue At Onset of Chest Pain; You May Repeat Every 5 Minutes For Up To 3 Doses.  Allergies (verified): No Known Drug Allergies  Past History:  Past Medical History: Last updated: 2009/05/15  HYPERLIPIDEMIA (ICD-272.4) CONGESTIVE HEART FAILURE, SYSTOLIC DYSFUNCTION (ICD-428.20) HYPERTENSION, BENIGN ESSENTIAL (ICD-401.1) MITRAL REGURGITATION (ICD-396.3) CAD (ICD-414.00) CAROTID ARTERY STENOSIS (ICD-433.10) TOBACCO USER (ICD-305.1) ENLARGEMENT OF LYMPH NODES (ICD-785.6) OSTEOARTHRITIS (ICD-715.90) LOW BACK PAIN (ICD-724.2) GERD (ICD-530.81) DEPRESSION (ICD-311) BREAST CANCER, HX OF (ICD-V10.3) URINARY INCONTINENCE, URGE (ICD-788.31) SCIATICA (ICD-724.3) CHRONIC  OBSTRUCTIVE PULMONARY DISEASE (ICD-496) Mild postoperative acute blood loss anemia.  Breast cancer, hx of - s/p L mastectomy and chemo in 1992 Congestive heart failure, systolic, mild EF 45-50% in 07/2007 Depression - on paxil in the past Low back pain - h/o ruptured discs  Past Surgical History: Last updated: 03/26/2009 Appendectomy as a child Cholecystectomy 1980's Mastectomy 1992 left carotid  endarterectomy on September 28.  History of the breast cancer 1992 status post left mastectomy and node dissection with history of breast implants with previous evidence of ruptures stable by CT scan in February 2010.  She has had a ruptured disk in her lower back  and operation for this in '95.   She has had bilateral cervical disk operations in 1990 and '95.   Status post bare-metal stenting of critical mid ICA  stenosis, with ulcerated plaque/thrombus.   Family History: Last updated: 2009-05-15 Father died of lung cancer at the age of 81, CVA and ? emphysema Mother died of MI at the age of 60.  She had HTN Brother died of CVA in his 72's, also had CAD Daughter and grandson are healthy  Social History: Last updated: 2009/05/15 LIves with daughter, Aggie Cosier and grandson, Ephriam Knuckles, and her husband Zollie Beckers.   She is unemployed now.  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   Pt is a current smoker.  started at age 66.  1 ppd.   Denies alcohol, denies drugs.  Review of Systems       Denies fever, malais, weight loss, blurry vision, decreased visual acuity, cough, sputum, , hemoptysis, pleuritic pain, palpitaitons, heartburn, abdominal pain, melena, lower extremity edema, claudication, or rash.   Vital Signs:  Patient profile:   65 year old female Height:      67 inches Weight:      167 pounds BMI:     26.25 Pulse rate:   62 / minute Pulse rhythm:   regular BP sitting:   122 / 60  (right arm) Cuff size:   large  Vitals Entered By: Danielle Rankin, CMA (September 12, 2009 4:31 PM)  Physical Exam  General:  Affect appropriate Healthy:  appears stated age HEENT: normal Neck supple with no adenopathy JVP normal no bruits no thyromegaly Lungs clear with no wheezing and good diaphragmatic motion Heart:  S1/S2 systolic  murmur,rub, gallop or click PMI normal Abdomen: benighn, BS positve, no tenderness, no AAA no bruit.  No HSM or HJR Distal pulses intact with no bruits No edema Neuro non-focal Skin warm and dry    Impression & Recommendations:  Problem # 1:  EMPHYSEMA (ICD-492.8) F/U Dr Shelle Iron.  Cotninue inhalers  Problem # 2:  HYPERLIPIDEMIA (ICD-272.4) Continue diet Rx and consider changing to crestor or Vytorin if LDL stays over 100 Her updated medication list for this problem includes:    Simvastatin 80 Mg Tabs (Simvastatin) .Marland Kitchen... Take one tablet by mouth daily at bedtime  CHOL: 166 (08/01/2007)   LDL: 124 (08/01/2007)   HDL: 29 (08/01/2007)   TG: 67 (08/01/2007)  Problem # 3:  HYPERTENSION (ICD-401.9) Well controlled Her updated medication list for this problem includes:    Carvedilol 6.25 Mg Tabs (Carvedilol) .Marland Kitchen... Take one tablet by mouth twice a day    Lisinopril 20 Mg Tabs (Lisinopril) .Marland Kitchen... Take one tablet by mouth daily    Aspirin Ec 325 Mg Tbec (Aspirin) .Marland Kitchen... Take one tablet by mouth daily    Furosemide 40 Mg Tabs (Furosemide) .Marland Kitchen... Take one tablet by mouth daily.  Problem # 4:  CAD (ICD-414.00) Stable stent RCA 2007 no angina  Nitro called in Her updated medication list for this problem includes:    Carvedilol 6.25 Mg Tabs (Carvedilol) .Marland Kitchen... Take one tablet by mouth twice a day    Lisinopril 20 Mg Tabs (Lisinopril) .Marland Kitchen... Take one tablet by mouth daily    Aspirin Ec 325 Mg Tbec (Aspirin) .Marland Kitchen... Take one tablet by mouth daily    Plavix 75 Mg Tabs (Clopidogrel bisulfate) .Marland Kitchen... Take one tablet by mouth daily    Nitrostat 0.4 Mg Subl (Nitroglycerin) .Marland Kitchen... 1 tablet under tongue at onset of chest pain; you may repeat every 5  minutes for up to 3 doses.  Problem # 5:  TOBACCO USER (ICD-305.1) Counseled.  Has Chantix script.  Suggested quit date 5/8 mothers day.  Patient Instructions: 1)  Your physician recommends that you schedule a follow-up appointment in: 6 MONTHS 2)  Your physician has requested that you have an echocardiogram.  Echocardiography is a painless test that uses sound waves to create images of your heart. It provides your doctor with information about the size and shape of your heart and how well your heart's chambers and valves are working.  This procedure takes approximately one hour. There are no restrictions for this procedure.SCHEDULE IN 6 MONTHS Prescriptions: NITROSTAT 0.4 MG SUBL (NITROGLYCERIN) 1 tablet under tongue at onset of chest pain; you may repeat every 5 minutes for up to 3 doses.  #25 x prn   Entered by:   Deliah Goody, RN   Authorized by:   Colon Branch, MD, [facility]   Signed by:   Deliah Goody, RN on 09/12/2009   Method used:   Electronically to        [facility] [phone]* (retail)       [address] [place], [place]  [address]       Ph: [phone]       Fax: [phone]   RxID:   [id number]

## 2010-07-08 NOTE — Consult Note (Signed)
Summary: [facility]   Imported By: Roderic Ovens 11/29/2009 16:14:49  _____________________________________________________________________  External Attachment:    Type:   Image     Comment:   External Document

## 2010-07-08 NOTE — Progress Notes (Signed)
Summary: returning your call re echo  Phone Note Call from Patient Call back at Home Phone [phone]   Caller: Patient Reason for Call: Talk to Nurse Summary of Call: re echo appt Initial call taken by: Roe Coombs,  April 18, 2010 11:36 AM  Follow-up for Phone Call        Appt Scheduled echo scheduled for 05/02/10 at 4:00 pm  and f/u with DR [facility] IS 05/08/10 AT 4:00 PM  Follow-up by: Scherrie Bateman, LPN,  April 18, 2010 11:59 AM

## 2010-07-08 NOTE — Assessment & Plan Note (Signed)
Summary: rov for emphysema   Copy to:  Charlton Haws Primary Provider/Referring Provider:  Lajuana Matte at [facility]  CC:  Pt is here for a 2 month f/u appt. Pt states she hasn't seen a change in breathing since starting Symbicort and Spiriva.  Pt states Symbicort causes "tickle in throat" and non-productive cough despite rinsing mouth out. Pt is currently smoking 1 ppd. .  History of Present Illness: the pt comes in today for f/u of her known moderate emphysema.  She has been using her bronchodilators compliantly, but unfortunately continues to smoke.  She has seen some improvement in her breathig, but not a lot.  She still has cough and some mucus production.  I have told her this will never go away until she quits smoking.  She does note some tickle in her throat after using symbicort, but is not gargling after each dose.  Medications Prior to Update: 1)  Ventolin Hfa 108 (90 Base) Mcg/act  Aers (Albuterol Sulfate) .... 2 Puffs Q 4hours As Needed Shortness of Breath 2)  Carvedilol 6.25 Mg Tabs (Carvedilol) .... Take One Tablet By Mouth Twice A Day 3)  Lisinopril 20 Mg Tabs (Lisinopril) .... Take One Tablet By Mouth Daily 4)  Aspirin Ec 325 Mg Tbec (Aspirin) .... Take One Tablet By Mouth Daily 5)  Simvastatin 80 Mg Tabs (Simvastatin) .... Take One Tablet By Mouth Daily At Bedtime 6)  Plavix 75 Mg Tabs (Clopidogrel Bisulfate) .... Take One Tablet By Mouth Daily 7)  Furosemide 40 Mg Tabs (Furosemide) .... Take One Tablet By Mouth Daily. 8)  Cymbalta 20 Mg Cpep (Duloxetine Hcl) .Marland Kitchen.. 1 Tab By Mouth Once Daily 9)  Potassium Chloride Crys Cr 20 Meq Cr-Tabs (Potassium Chloride Crys Cr) .Marland Kitchen.. 1 Tab By Mouth Once Daily 10)  Spiriva Handihaler 18 Mcg  Caps (Tiotropium Bromide Monohydrate) .... One Puff  in Handihaler Daily 11)  Symbicort 160-4.5 Mcg/act  Aero (Budesonide-Formoterol Fumarate) .... Two Puffs Twice Daily  Allergies (verified): No Known Drug Allergies  Review of Systems    See HPI  Vital Signs:  Patient profile:   65 year old female Height:      67 inches Weight:      173.25 pounds O2 Sat:      96 % on Room air Temp:     98.8 degrees F oral Pulse rate:   64 / minute BP sitting:   114 / 78  (right arm) Cuff size:   regular  Vitals Entered By: Arman Filter LPN (August 28, 2009 3:59 PM)  O2 Flow:  Room air CC: Pt is here for a 2 month f/u appt. Pt states she hasn't seen a change in breathing since starting Symbicort and Spiriva.  Pt states Symbicort causes "tickle in throat" and non-productive cough despite rinsing mouth out. Pt is currently smoking 1 ppd.  Comments Medications reviewed with patient Arman Filter LPN  August 28, 2009 4:03 PM    Physical Exam  General:  wd female in nad Lungs:  mild rhonchi, no wheezing, adequate airflow. Heart:  rrr, no mrg.   Impression & Recommendations:  Problem # 1:  EMPHYSEMA (ICD-492.8)  the pt has moderate emphysema, and unfortunately continues to smoke. She has seen some improvement with the meds, but I have explained to her again no medication will solve her breathing issues if she doesn't quit smoking.  She has chantix at home, and I have asked her to start on this.  Time spent today with pt was  discussing pathophysiology of emphysema, treatment options, and the importance of smoking cessation.    Other Orders: Est. Patient Level III ([address])  Patient Instructions: 1)  stay on current meds.  Will try to get your symbicort cleared thru insurance. 2)  rinse mouth with water after symbicort use, gargle, then swallow. 3)  stop smoking.  I think you should start on the chantix that you have. 4)  followup with me in 4mos

## 2010-07-08 NOTE — Miscellaneous (Signed)
Summary: Changing Systolic to Diastolic CHF  Clinical Lists Changes  Problems: Changed problem from CONGESTIVE HEART FAILURE, SYSTOLIC DYSFUNCTION (ICD-428.20) to CHRONIC DIASTOLIC HEART FAILURE (ICD-428.32) Observations: Added new observation of PAST MED HX:  HYPERLIPIDEMIA (ICD-272.4) CONGESTIVE HEART FAILURE, SYSTOLIC DYSFUNCTION (ICD-428.20) HYPERTENSION, BENIGN ESSENTIAL (ICD-401.1) MITRAL REGURGITATION (ICD-396.3) CAD (ICD-414.00) CAROTID ARTERY STENOSIS (ICD-433.10) TOBACCO USER (ICD-305.1) ENLARGEMENT OF LYMPH NODES (ICD-785.6) OSTEOARTHRITIS (ICD-715.90) LOW BACK PAIN (ICD-724.2) GERD (ICD-530.81) DEPRESSION (ICD-311) BREAST CANCER, HX OF (ICD-V10.3) URINARY INCONTINENCE, URGE (ICD-788.31) SCIATICA (ICD-724.3) CHRONIC OBSTRUCTIVE PULMONARY DISEASE (ICD-496) Mild postoperative acute blood loss anemia.  Breast cancer, hx of - s/p L mastectomy and chemo in 1992 Diastolic CHF -Echo 06/2009: EF 54% to 55%, Mild AR, Mild MR -Echo  mild EF 45-50% in 07/2007 Depression - on paxil in the past Low back pain - h/o ruptured discs  (02/25/2010 11:10) Added new observation of PRIMARY MD: Lajuana Matte at [facility] (02/25/2010 11:10)       Past History:  Past Medical History:  HYPERLIPIDEMIA (ICD-272.4) CONGESTIVE HEART FAILURE, SYSTOLIC DYSFUNCTION (ICD-428.20) HYPERTENSION, BENIGN ESSENTIAL (ICD-401.1) MITRAL REGURGITATION (ICD-396.3) CAD (ICD-414.00) CAROTID ARTERY STENOSIS (ICD-433.10) TOBACCO USER (ICD-305.1) ENLARGEMENT OF LYMPH NODES (ICD-785.6) OSTEOARTHRITIS (ICD-715.90) LOW BACK PAIN (ICD-724.2) GERD (ICD-530.81) DEPRESSION (ICD-311) BREAST CANCER, HX OF (ICD-V10.3) URINARY INCONTINENCE, URGE (ICD-788.31) SCIATICA (ICD-724.3) CHRONIC OBSTRUCTIVE PULMONARY DISEASE (ICD-496) Mild postoperative acute blood loss anemia.  Breast cancer, hx of - s/p L mastectomy and chemo in 1992 Diastolic CHF -Echo 06/2009: EF 09% to 55%, Mild AR, Mild MR -Echo   mild EF 45-50% in 07/2007 Depression - on paxil in the past Low back pain - h/o ruptured discs

## 2010-07-08 NOTE — Letter (Signed)
Summary: Outpatient Coinsurance Notice  Outpatient Coinsurance Notice   Imported By: Marylou Mccoy 05/07/2010 12:02:30  _____________________________________________________________________  External Attachment:    Type:   Image     Comment:   External Document

## 2010-07-10 NOTE — Letter (Signed)
Summary: Clearance Letter  [employer], Main Office  [address]   [place], [place] [address]   Phone: [phone]  Fax: [phone]    May 22, 2010  Re:     [facility] Address:   [address]     [place], [place]  [address] DOB:     [birth year] MRN:     [id number]   Dear [name],      [name] is okay to stop her plavix and is clear to have her teeth extracted. Please call with any questions or concerns.    Sincerely,  Deliah Goody, RN/Dr Charlton Haws  Appended Document: Clearance Letter faxed to [facility]-[phone]

## 2010-07-10 NOTE — Miscellaneous (Signed)
  Clinical Lists Changes  Problems: Removed problem of HYPERTENSION (ICD-401.9) Removed problem of ENLARGEMENT OF LYMPH NODES (ICD-785.6) Removed problem of BREAST CANCER, HX OF (ICD-V10.3) Removed problem of SCIATICA (ICD-724.3)

## 2010-08-25 LAB — CROSSMATCH: Antibody Screen: NEGATIVE

## 2010-08-25 LAB — URINALYSIS, ROUTINE W REFLEX MICROSCOPIC
Bilirubin Urine: NEGATIVE
Glucose, UA: NEGATIVE mg/dL
Hgb urine dipstick: NEGATIVE
Ketones, ur: NEGATIVE mg/dL
Nitrite: NEGATIVE
Protein, ur: NEGATIVE mg/dL
Specific Gravity, Urine: 1.01 (ref 1.005–1.030)
pH: 6 (ref 5.0–8.0)

## 2010-08-25 LAB — BASIC METABOLIC PANEL
BUN: 17 mg/dL (ref 6–23)
CO2: 24 mEq/L (ref 19–32)
CO2: 26 mEq/L (ref 19–32)
CO2: 27 mEq/L (ref 19–32)
Calcium: 7.4 mg/dL — ABNORMAL LOW (ref 8.4–10.5)
Calcium: 8.8 mg/dL (ref 8.4–10.5)
Chloride: 102 mEq/L (ref 96–112)
Chloride: 106 mEq/L (ref 96–112)
Chloride: 106 mEq/L (ref 96–112)
Chloride: 97 mEq/L (ref 96–112)
Creatinine, Ser: 0.71 mg/dL (ref 0.4–1.2)
Creatinine, Ser: 0.96 mg/dL (ref 0.4–1.2)
Creatinine, Ser: 0.83 mg/dL (ref 0.4–1.2)
GFR calc Af Amer: >60 mL/min (ref >60)
GFR calc Af Amer: >60 mL/min (ref >60)
GFR calc Af Amer: >60 mL/min (ref >60)
GFR calc Af Amer: >60 mL/min (ref >60)
GFR calc Af Amer: >60 mL/min (ref >60)
GFR calc non Af Amer: 58 mL/min — ABNORMAL LOW (ref >60)
GFR calc non Af Amer: >60 mL/min (ref >60)
GFR calc non Af Amer: >60 mL/min (ref >60)
Glucose, Bld: 100 mg/dL — ABNORMAL HIGH (ref 70–99)
Potassium: 3.1 mEq/L — ABNORMAL LOW (ref 3.5–5.1)
Potassium: 3.5 mEq/L (ref 3.5–5.1)
Potassium: 3.8 mEq/L (ref 3.5–5.1)
Sodium: 135 mEq/L (ref 135–145)
Sodium: 135 mEq/L (ref 135–145)
Sodium: 136 mEq/L (ref 135–145)
Sodium: 134 mEq/L — ABNORMAL LOW (ref 135–145)

## 2010-08-25 LAB — COMPREHENSIVE METABOLIC PANEL
ALT: 20 U/L (ref 0–35)
ALT: 19 U/L (ref 0–35)
AST: 13 U/L (ref 0–37)
AST: 19 U/L (ref 0–37)
Albumin: 2.4 g/dL — ABNORMAL LOW (ref 3.5–5.2)
Albumin: 3.3 g/dL — ABNORMAL LOW (ref 3.5–5.2)
Alkaline Phosphatase: 42 U/L (ref 39–117)
CO2: 24 mEq/L (ref 19–32)
Calcium: 8.4 mg/dL (ref 8.4–10.5)
Chloride: 108 mEq/L (ref 96–112)
Chloride: 96 mEq/L (ref 96–112)
GFR calc Af Amer: >60 mL/min (ref >60)
GFR calc non Af Amer: >60 mL/min (ref >60)
Sodium: 133 mEq/L — ABNORMAL LOW (ref 135–145)
Sodium: 127 mEq/L — ABNORMAL LOW (ref 135–145)
Total Protein: 4.5 g/dL — ABNORMAL LOW (ref 6.0–8.3)

## 2010-08-25 LAB — DIFFERENTIAL
Basophils Relative: 0 % (ref 0–1)
Eosinophils Absolute: 0.1 10*3/uL (ref 0.0–0.7)
Eosinophils Absolute: 0.1 10*3/uL (ref 0.0–0.7)
Eosinophils Absolute: 0 10*3/uL (ref 0.0–0.7)
Eosinophils Relative: 1 % (ref 0–5)
Eosinophils Relative: 1 % (ref 0–5)
Eosinophils Relative: 0 % (ref 0–5)
Lymphocytes Relative: 10 % — ABNORMAL LOW (ref 12–46)
Lymphs Abs: 1.3 10*3/uL (ref 0.7–4.0)
Lymphs Abs: 0.3 10*3/uL — ABNORMAL LOW (ref 0.7–4.0)
Monocytes Absolute: 0.6 10*3/uL (ref 0.1–1.0)
Monocytes Relative: 6 % (ref 3–12)
Monocytes Relative: 1 % — ABNORMAL LOW (ref 3–12)
Neutro Abs: 11 10*3/uL — ABNORMAL HIGH (ref 1.7–7.7)
Neutro Abs: 7.5 10*3/uL (ref 1.7–7.7)
Neutrophils Relative %: 86 % — ABNORMAL HIGH (ref 43–77)

## 2010-08-25 LAB — MAGNESIUM: Magnesium: 2.4 mg/dL (ref 1.5–2.5)

## 2010-08-25 LAB — CBC
HCT: 11.8 % — ABNORMAL LOW (ref 36.0–46.0)
HCT: 23.9 % — ABNORMAL LOW (ref 36.0–46.0)
HCT: 30.9 % — ABNORMAL LOW (ref 36.0–46.0)
HCT: 30.9 % — ABNORMAL LOW (ref 36.0–46.0)
Hemoglobin: 10.5 g/dL — ABNORMAL LOW (ref 12.0–15.0)
Hemoglobin: 4 g/dL — CL (ref 12.0–15.0)
Hemoglobin: 8.2 g/dL — ABNORMAL LOW (ref 12.0–15.0)
Hemoglobin: 10.6 g/dL — ABNORMAL LOW (ref 12.0–15.0)
MCHC: 33.9 g/dL (ref 30.0–36.0)
MCHC: 34.1 g/dL (ref 30.0–36.0)
MCHC: 34.5 g/dL (ref 30.0–36.0)
MCV: 91.5 fL (ref 78.0–100.0)
MCV: 92.3 fL (ref 78.0–100.0)
MCV: 96.5 fL (ref 78.0–100.0)
MCV: 92.9 fL (ref 78.0–100.0)
Platelets: 197 10*3/uL (ref 150–400)
Platelets: 155 10*3/uL (ref 150–400)
RBC: 1.22 MIL/uL — ABNORMAL LOW (ref 3.87–5.11)
RBC: 2.87 MIL/uL — ABNORMAL LOW (ref 3.87–5.11)
RBC: 3.05 MIL/uL — ABNORMAL LOW (ref 3.87–5.11)
RBC: 3.37 MIL/uL — ABNORMAL LOW (ref 3.87–5.11)
RBC: 3.1 MIL/uL — ABNORMAL LOW (ref 3.87–5.11)
RBC: 3.32 MIL/uL — ABNORMAL LOW (ref 3.87–5.11)
WBC: 7.2 10*3/uL (ref 4.0–10.5)
WBC: 9.3 10*3/uL (ref 4.0–10.5)
WBC: 8.8 10*3/uL (ref 4.0–10.5)
WBC: 9.8 10*3/uL (ref 4.0–10.5)

## 2010-08-25 LAB — CULTURE, BLOOD (ROUTINE X 2)
Culture: NO GROWTH
Culture: NO GROWTH

## 2010-08-25 LAB — HEMOGLOBIN AND HEMATOCRIT, BLOOD
HCT: 25.3 % — ABNORMAL LOW (ref 36.0–46.0)
Hemoglobin: 8.6 g/dL — ABNORMAL LOW (ref 12.0–15.0)

## 2010-08-25 LAB — IMMUNOFIXATION ELECTROPHORESIS: IgA: 88 mg/dL (ref 68–378)

## 2010-08-25 LAB — HEMOGLOBIN A1C
Hgb A1c MFr Bld: 5.7 % — ABNORMAL HIGH (ref <5.7)
Mean Plasma Glucose: 117 mg/dL — ABNORMAL HIGH (ref <117)

## 2010-08-25 LAB — UIFE/LIGHT CHAINS/TP QN, 24-HR UR
Alpha 1, Urine: DETECTED — AB
Alpha 2, Urine: DETECTED — AB
Beta, Urine: DETECTED — AB
Free Kappa Lt Chains,Ur: 2.16 mg/dL — ABNORMAL HIGH (ref 0.04–1.51)
Free Lambda Excretion/Day: 10.26 mg/d
Gamma Globulin, Urine: DETECTED — AB
Time: 24 hours
Total Protein, Urine-Ur/day: 160 mg/d — ABNORMAL HIGH (ref 10–140)
Total Protein, Urine: 2.8 mg/dL
Volume, Urine: 5700 mL

## 2010-08-25 LAB — PROTEIN ELECTROPHORESIS, SERUM
Albumin ELP: 55 % — ABNORMAL LOW (ref 55.8–66.1)
Beta Globulin: 7.4 % — ABNORMAL HIGH (ref 4.7–7.2)
Total Protein ELP: 4.8 g/dL — ABNORMAL LOW (ref 6.0–8.3)

## 2010-08-25 LAB — GLUCOSE, CAPILLARY: Glucose-Capillary: 217 mg/dL — ABNORMAL HIGH (ref 70–99)

## 2010-08-25 LAB — CARDIAC PANEL(CRET KIN+CKTOT+MB+TROPI)
CK, MB: 1 ng/mL (ref 0.3–4.0)
CK, MB: 1.4 ng/mL (ref 0.3–4.0)
Relative Index: INVALID (ref 0.0–2.5)
Total CK: 23 U/L (ref 7–177)
Total CK: 26 U/L (ref 7–177)
Troponin I: 0.01 ng/mL (ref 0.00–0.06)
Troponin I: 0.01 ng/mL (ref 0.00–0.06)
Troponin I: 0.02 ng/mL (ref 0.00–0.06)

## 2010-08-25 LAB — HEMOCCULT GUIAC POC 1CARD (OFFICE): Fecal Occult Bld: POSITIVE

## 2010-08-25 LAB — VITAMIN B12: Vitamin B-12: 427 pg/mL (ref 211–911)

## 2010-08-25 LAB — H. PYLORI ANTIBODY, IGG: H Pylori IgG: 3.3 {ISR} — ABNORMAL HIGH

## 2010-08-25 LAB — TSH: TSH: 1.32 u[IU]/mL (ref 0.350–4.500)

## 2010-08-25 LAB — MRSA PCR SCREENING: MRSA by PCR: NEGATIVE

## 2010-08-25 LAB — BRAIN NATRIURETIC PEPTIDE: Pro B Natriuretic peptide (BNP): <30 pg/mL (ref 0.0–100.0)

## 2010-08-25 LAB — PROTIME-INR
INR: 1.1 (ref 0.00–1.49)
Prothrombin Time: 14.1 seconds (ref 11.6–15.2)

## 2010-09-12 ENCOUNTER — Other Ambulatory Visit: Payer: Self-pay | Admitting: Pulmonary Disease

## 2010-09-12 LAB — URINALYSIS, ROUTINE W REFLEX MICROSCOPIC
Leukocytes, UA: NEGATIVE
Nitrite: NEGATIVE
Protein, ur: NEGATIVE mg/dL
Specific Gravity, Urine: 1.005 (ref 1.005–1.030)
Urobilinogen, UA: 0.2 mg/dL (ref 0.0–1.0)

## 2010-09-12 LAB — TYPE AND SCREEN: Antibody Screen: NEGATIVE

## 2010-09-12 LAB — CBC
HCT: 26 % — ABNORMAL LOW (ref 36.0–46.0)
Hemoglobin: 9 g/dL — ABNORMAL LOW (ref 12.0–15.0)
MCHC: 34.5 g/dL (ref 30.0–36.0)
Platelets: 212 10*3/uL (ref 150–400)
RBC: 2.81 MIL/uL — ABNORMAL LOW (ref 3.87–5.11)
RDW: 14.3 % (ref 11.5–15.5)
WBC: 7.2 10*3/uL (ref 4.0–10.5)

## 2010-09-12 LAB — COMPREHENSIVE METABOLIC PANEL
AST: 18 U/L (ref 0–37)
Albumin: 4 g/dL (ref 3.5–5.2)
Alkaline Phosphatase: 89 U/L (ref 39–117)
BUN: 9 mg/dL (ref 6–23)
Chloride: 93 mEq/L — ABNORMAL LOW (ref 96–112)
GFR calc Af Amer: >60 mL/min (ref >60)
Potassium: 4 mEq/L (ref 3.5–5.1)
Sodium: 130 mEq/L — ABNORMAL LOW (ref 135–145)
Total Bilirubin: 0.6 mg/dL (ref 0.3–1.2)
Total Protein: 6.6 g/dL (ref 6.0–8.3)

## 2010-09-12 LAB — APTT: aPTT: 29 seconds (ref 24–37)

## 2010-09-12 LAB — BASIC METABOLIC PANEL
CO2: 27 mEq/L (ref 19–32)
Calcium: 8.2 mg/dL — ABNORMAL LOW (ref 8.4–10.5)
GFR calc Af Amer: >60 mL/min (ref >60)
GFR calc non Af Amer: 54 mL/min — ABNORMAL LOW (ref >60)
Potassium: 3.8 mEq/L (ref 3.5–5.1)
Sodium: 136 mEq/L (ref 135–145)

## 2010-09-12 LAB — PROTIME-INR: INR: 1 (ref 0.00–1.49)

## 2010-09-12 LAB — URINE MICROSCOPIC-ADD ON

## 2010-09-23 LAB — CBC
Hemoglobin: 12.3 g/dL (ref 12.0–15.0)
MCHC: 33.8 g/dL (ref 30.0–36.0)
MCHC: 34.2 g/dL (ref 30.0–36.0)
MCV: 88.5 fL (ref 78.0–100.0)
Platelets: 171 10*3/uL (ref 150–400)
Platelets: 175 10*3/uL (ref 150–400)
RBC: 3.74 MIL/uL — ABNORMAL LOW (ref 3.87–5.11)
RBC: 4.12 MIL/uL (ref 3.87–5.11)
RDW: 14.8 % (ref 11.5–15.5)
WBC: 8.1 10*3/uL (ref 4.0–10.5)

## 2010-09-23 LAB — POCT I-STAT 3, VENOUS BLOOD GAS (G3P V)
Bicarbonate: 28.2 mEq/L — ABNORMAL HIGH (ref 20.0–24.0)
TCO2: 30 mmol/L (ref 0–100)
pCO2, Ven: 49.6 mmHg (ref 45.0–50.0)
pH, Ven: 7.363 — ABNORMAL HIGH (ref 7.250–7.300)
pO2, Ven: 33 mmHg (ref 30.0–45.0)

## 2010-09-23 LAB — BASIC METABOLIC PANEL
Calcium: 8.4 mg/dL (ref 8.4–10.5)
Creatinine, Ser: 0.82 mg/dL (ref 0.4–1.2)
GFR calc Af Amer: >60 mL/min (ref >60)
GFR calc non Af Amer: >60 mL/min (ref >60)
Glucose, Bld: 98 mg/dL (ref 70–99)
Potassium: 2.9 mEq/L — ABNORMAL LOW (ref 3.5–5.1)
Sodium: 140 mEq/L (ref 135–145)

## 2010-09-23 LAB — DIFFERENTIAL
Basophils Relative: 0 % (ref 0–1)
Eosinophils Absolute: 0.1 10*3/uL (ref 0.0–0.7)
Monocytes Absolute: 0.3 10*3/uL (ref 0.1–1.0)
Monocytes Relative: 3 % (ref 3–12)

## 2010-09-23 LAB — POCT CARDIAC MARKERS
CKMB, poc: 3 ng/mL (ref 1.0–8.0)
CKMB, poc: 3.1 ng/mL (ref 1.0–8.0)
Myoglobin, poc: 62.4 ng/mL (ref 12–200)

## 2010-09-23 LAB — CK TOTAL AND CKMB (NOT AT ARMC)
Relative Index: 3.2 — ABNORMAL HIGH (ref 0.0–2.5)
Relative Index: 4.6 — ABNORMAL HIGH (ref 0.0–2.5)
Relative Index: 5.1 — ABNORMAL HIGH (ref 0.0–2.5)

## 2010-09-23 LAB — POCT I-STAT 3, ART BLOOD GAS (G3+)
Acid-Base Excess: 1 mmol/L (ref 0.0–2.0)
Bicarbonate: 26.1 mEq/L — ABNORMAL HIGH (ref 20.0–24.0)
O2 Saturation: 90 %
TCO2: 27 mmol/L (ref 0–100)
pO2, Arterial: 61 mmHg — ABNORMAL LOW (ref 80.0–100.0)

## 2010-09-23 LAB — POCT I-STAT, CHEM 8
HCT: 37 % (ref 36.0–46.0)
Hemoglobin: 12.6 g/dL (ref 12.0–15.0)
Potassium: 3.4 mEq/L — ABNORMAL LOW (ref 3.5–5.1)
Sodium: 142 mEq/L (ref 135–145)

## 2010-09-23 LAB — BRAIN NATRIURETIC PEPTIDE: Pro B Natriuretic peptide (BNP): 1330 pg/mL — ABNORMAL HIGH (ref 0.0–100.0)

## 2010-09-23 LAB — TROPONIN I: Troponin I: 0.37 ng/mL — ABNORMAL HIGH (ref 0.00–0.06)

## 2010-09-23 LAB — PLATELET COUNT: Platelets: 155 10*3/uL (ref 150–400)

## 2010-09-23 LAB — HEPARIN LEVEL (UNFRACTIONATED): Heparin Unfractionated: 0.23 IU/mL — ABNORMAL LOW (ref 0.30–0.70)

## 2010-10-14 ENCOUNTER — Other Ambulatory Visit: Payer: Self-pay | Admitting: Pulmonary Disease

## 2010-10-21 NOTE — Assessment & Plan Note (Signed)
OFFICE VISIT   [name], [name]  DOB:  May 26, 1946                                       03/20/2009  ZOXWR#:[id number]   The patient returns for followup today.  She underwent left carotid  endarterectomy on September 28.  This was an uneventful procedure.  She  was discharged home postoperatively on postop day #1.  She returns for  followup today.  She denies any neurologic symptoms.   Blood pressure today is 149/72 in the left arm, 143/57 in the right arm,  pulse is 64 and regular.  Neck exam shows a healing left carotid  incision.  Neurological:  She has symmetric upper extremity and lower  extremity motor strength which is 5/5.   She currently is on aspirin and Plavix.  She will continue to take  these.  I again counseled her today of smoking cessation.  She will  follow up in our carotid protocol in six months' time for repeat duplex  exam.   Carolyn Hora. Fields, MD  Electronically Signed   CEF/MEDQ  D:  03/20/2009  T:  03/21/2009  Job:  [id number]   cc:   Mitzi Hansen, NP  Veverly Fells. Excell Seltzer, MD  Noralyn Pick. Eden Emms, MD, [facility]

## 2010-10-21 NOTE — Discharge Summary (Signed)
NAMEFERRIN, LIEBIG              ACCOUNT NO.:  [id number]   MEDICAL RECORD NO.:  [id number]          PATIENT TYPE:  INP   LOCATION:  2507                         FACILITY:  [facility]   PHYSICIAN:  Luis Abed, MD, FACCDATE OF BIRTH:  03/25/1946   DATE OF ADMISSION:  07/19/2008  DATE OF DISCHARGE:  07/21/2008                               DISCHARGE SUMMARY   PRIMARY CARDIOLOGIST:  Theron Arista C. Eden Emms, MD, [facility] (new).   FINAL DISCHARGE DIAGNOSES:  1. Severe single-vessel coronary artery disease.      a.     Mildly elevated troponins.      b.     Status post bare-metal stenting of critical mid ICA       stenosis, with ulcerated plaque/thrombus.      c.     Residual nonobstructive coronary artery disease.      d.     Moderate left ventricular dysfunction (EF 40%); inferior       akinesis.  2. Acute/chronic systolic heart failure.      a.     Mediastinal/hilar lymphadenopathy.  3. Stable by CT angiogram.  4. Bilateral carotid bruits.   SECONDARY DIAGNOSES:  1. Hypertension.  2. Chronic obstructive pulmonary disease/ongoing tobacco.  3. History of noncompliance.  4. Breast implants.      a.     Previous evidence of rupture, stable by chest CT scan this       admission.   REASON FOR ADMISSION:  Ms. Moffitt is a 65 year old female, with no  prior known history of heart disease, but several cardiac risk factors.  She presented to the emergency room with complaint of chest pain and  shortness of breath, and was admitted by Dr. Eden Emms for further  evaluation.   HOSPITAL COURSE:  Initial set of cardiac markers notable for a troponin  of 0.37, with subsequent downward trending.  Total CPKs all remained  within normal limits, with initial marginally elevated MB of 4.6.   The patient was treated with intravenous heparin for possible acute  coronary syndrome, although her presenting symptoms were felt to be  atypical.  She was also checked with treated aggressively with IV Lasix,  for  treatment of congestive heart failure.  The patient's chest x-ray  was consistent with this and her admission BNP level was 1300.   Dr. Eden Emms also proceeded with a CT scan of the chest (noncontrast),  given a prior history of hilar adenopathy as well as prior evidence of  ruptured breast implants.  Chest CT scan this admission suggested that  this appeared stable, with evidence of hilar and mediastinal nodes, for  not exceeding a centimeter without any evidence of significant change  since previous study.  Also indicated that the breast implant on the  left was also unchanged, possibly with intracapsular rupture.   Following stabilization, recommendation was to proceed with right/left  heart catheterization, which was performed by Dr. Tonny Bollman.  This  yielded evidence of severe single-vessel CAD with 99% occlusion of the  mid RCA, with evidence of ulcerated plaque and thrombus.  Dr. Excell Seltzer  proceeded was successful  bare-metal stenting, with no noted  complications.  He recommended a minimum of 30 days of dual-antiplatelet  therapy with both aspirin and Plavix.   The patient was cleared for discharge following morning, in  hemodynamically stable condition.   Of note, the patient presents with no insurance and points out that her  husband was recently laid off as well.  We will arrange to provide  assistance of her Plavix by [facility], as well as by the manufacturer.  The patient will be instructed to take Plavix 150 mg daily for 1 week,  followed by 75 mg daily.  She is to otherwise get the remainder of her  medications at the [facility], as she has done in the past.   Of note, the patient was also found to have bilateral carotid bruits on  examination at time of discharge.  We will arrange to have outpatient  carotid Dopplers to be sent to our office.  She will also need a  followup BMET and BNP level, for close monitoring of electrolytes and  renal function on Lasix.  Of note,  the patient was treated aggressively  with Lasix 20 mg IV b.i.d. prior to discharge, and this was consolidated  to 40 mg daily, with supplemental potassium.   The patient also has no primary care physician, and will need to  establish with one in the local area.   A testing lipid profile was not assessed on this admission, and this can  be arranged at time of follow up with Dr. Eden Emms.   DISPOSITION AT TIME OF DISCHARGE:  Stable.   DISCHARGE LABORATORIES:  Hemoglobin low 11, hematocrit 33, platelet 170,  WBC 8.  Sodium 136, potassium 3.9, BUN 7, creatinine 0.8, glucose 114.   DISCHARGE MEDICATIONS:  1. Plavix 75 mg daily (2 tablets daily x1 week), then 1 tablet daily,      to be taken for at least 1 month.  2. Coated aspirin 325 mg daily.  3. Lasix 40 mg daily.  4. K-Dur 20 mEq daily.  5. Coreg 6.25 mg b.i.d.  6. Lisinopril 20 mg daily.  7. Simvastatin 80 mg at bedtime.  8. Nicotine patch 21 mg/hour daily.  9. Nitrostat 0.4 mg p.r.n.   INSTRUCTIONS:  1. The patient is to follow up with Dr. Charlton Haws in our [place]      office in approximately 2 weeks.  Arrangements to be made through      our office.  2. The patient is instructed to establish with a primary care      physician, in the local area.  3. The patient will need followup carotid Dopplers, to be scheduled      through our office.  We will also arrange for her to have a      followup BMET and BNP level in 1 week.   DISCHARGE DURATION:  Greater than 30 minutes.      Gene Serpe, PA-C      Luis Abed, MD, [facility]  Electronically Signed    GS/MEDQ  D:  07/21/2008  T:  07/21/2008  Job:  [id number]

## 2010-10-21 NOTE — Procedures (Signed)
CAROTID DUPLEX EXAM   INDICATION:  Carotid bruit.   HISTORY:  Diabetes:  No.  Cardiac:  CHF, MI, stent.  Hypertension:  Yes.  Smoking:  Yes.  Previous Surgery:  No.  CV History:  Dizziness since February.  Amaurosis Fugax No, Paresthesias No, Hemiparesis No                                       RIGHT             LEFT  Brachial systolic pressure:         158               162  Brachial Doppler waveforms:         Normal            Normal  Vertebral direction of flow:        Antegrade         Antegrade  DUPLEX VELOCITIES (cm/sec)  CCA peak systolic                   98                79  ECA peak systolic                   237               70  ICA peak systolic                   173               446  ICA end diastolic                   51                154  PLAQUE MORPHOLOGY:                  Mixed             Mixed  PLAQUE AMOUNT:                      Moderate          Severe  PLAQUE LOCATION:                    ICA/ECA           ICA/ECA/CCA   IMPRESSION:  1. 40-59% stenosis of the right internal carotid artery.  2. 80-99% stenosis of the left internal carotid artery.  3. A preliminary report was called to Mitzi Hansen, NP on 02/20/2009      and a vascular consult was scheduled.   ___________________________________________  Janetta Hora Fields, MD   CH/MEDQ  D:  02/20/2009  T:  02/21/2009  Job:  [id number]

## 2010-10-21 NOTE — Procedures (Signed)
CAROTID DUPLEX EXAM   INDICATION:  Follow up carotid artery disease.   HISTORY:  Diabetes:  No.  Cardiac:  CHF, MI, stent.  Hypertension:  Yes.  Smoking:  Yes.  Previous Surgery:  Left CEA, 03/05/2009, by Dr. Darrick Penna.  CV History:  Asymptomatic.  Amaurosis Fugax No, Paresthesias No, Hemiparesis No.                                       RIGHT             LEFT  Brachial systolic pressure:                           Previous  CA/lymphectomy  Brachial Doppler waveforms:         WNL               WNL  Vertebral direction of flow:        Antegrade         Antegrade  DUPLEX VELOCITIES (cm/sec)  CCA peak systolic                   M = 100, D = 265  138  ECA peak systolic                   289               118  ICA peak systolic                   199               M = 156  ICA end diastolic                   50                46  PLAQUE MORPHOLOGY:                  Mixed             Intimal thickening  PLAQUE AMOUNT:                      Moderate          Mild  PLAQUE LOCATION:                    ICA/ECA/CCA       Bifurcation   IMPRESSION:  1. Right internal carotid artery shows evidence of 40% to 59% stenosis      and appears stable.  2. Left internal carotid artery velocities distal to patch are      suggestive of 40% to 59% stenosis; however, no plaque visualized.  3. Right distal common carotid artery stenosis.  4. Right external carotid artery stenosis.        ___________________________________________  Janetta Hora Fields, MD   AS/MEDQ  D:  09/25/2009  T:  09/25/2009  Job:  [id number]

## 2010-10-21 NOTE — Procedures (Signed)
CAROTID DUPLEX EXAM   INDICATION:  Followup carotid disease.   HISTORY:  Diabetes:  No.  Cardiac:  CHF, MI, stent.  Hypertension:  Yes.  Smoking:  Yes.  Previous Surgery:  Left carotid endarterectomy in 03/05/2009 by Dr.  Darrick Penna.  CV History:  Asymptomatic.  Amaurosis Fugax No, Paresthesias No, Hemiparesis No                                       RIGHT             LEFT  Brachial systolic pressure:                           Previous cancer /  lymphectomy  Brachial Doppler waveforms:         Normal            Normal  Vertebral direction of flow:        Antegrade         Antegrade  / low  flow  DUPLEX VELOCITIES (cm/sec)  CCA peak systolic                   82                146  ECA peak systolic                   250               95  ICA peak systolic                   236               156 mid  ICA end diastolic                   51                53  PLAQUE MORPHOLOGY:                  Mixed             Mixed  PLAQUE AMOUNT:                      Moderate          Mild  PLAQUE LOCATION:                    ICA / ECA / CCA   CCA / ICA   IMPRESSION:  1. Doppler velocity suggests evidence of high end 40%-59% stenosis in      the right internal carotid artery and appears stable.  2. Doppler velocity suggests 40%-59% stenosis in the left mid internal      carotid artery past carotid endarterectomy site.  3. Right external carotid artery stenosis.  4. Antegrade flow in bilateral vertebral arteries; however, left      vertebral suggests low flow.  5. No significant changes from previous exam.       ___________________________________________  Janetta Hora. Fields, MD   NT/MEDQ  D:  05/23/2010  T:  05/23/2010  Job:  [id number]

## 2010-10-21 NOTE — Discharge Summary (Signed)
[name], [name]              ACCOUNT NO.:  [id number]   MEDICAL RECORD NO.:  [id number]          PATIENT TYPE:  INP   LOCATION:  2507                         FACILITY:  [facility]   PHYSICIAN:  Noralyn Pick. Eden Emms, MD, FACCDATE OF BIRTH:  [birth year]   DATE OF ADMISSION:  07/19/2008  DATE OF DISCHARGE:  07/21/2008                               DISCHARGE SUMMARY   ADDENDUM   SECONDARY DIAGNOSES:  1. History of severe mitral regurgitation.      a.     Three plus by 2-D echocardiogram, February 2009.  2. History of pulmonary hypertension.      Gene Serpe, PA-C      Peter C. Eden Emms, MD, [facility]  Electronically Signed    GS/MEDQ  D:  07/21/2008  T:  07/21/2008  Job:  [id number]

## 2010-10-21 NOTE — Consult Note (Signed)
[name], [name]              ACCOUNT NO.:  [id number]   MEDICAL RECORD NO.:  [id number]          PATIENT TYPE:  INP   LOCATION:  4703                         FACILITY:  [facility]   PHYSICIAN:  Noralyn Pick. Eden Emms, MD, FACCDATE OF BIRTH:  [birth year]   DATE OF CONSULTATION:  DATE OF DISCHARGE:                                 CONSULTATION   A 62-year patient being admitted with chest pain and congestive heart  failure.   The patient was last seen about a year ago and discharged on August 02, 2007 by [facility].  He does not get regular medical  followup.  She did see Dr. Georgiana Shore 2-3 times last year.  She says she  last saw Dr. Georgiana Shore about 6 months ago.  In 2009, she was found to have  significant COPD with mildly decreased LV function by echo at 45-50%.  She continues to be a 2 pack a day smoker with hypertension, but has not  been taking medicines regularly.  She seems to be under a lot of stress.  She talks about moving frequently.  Her 24 year old daughter lives with  her and 51 year old grandson.  She seems a bit fearful of hospitals.   She has been feeling ill for a long time.  She was having increasing  shortness of breath.  Today, she had a lot of chest pain.  The pain was  atypical.  It was nonexertional.  It was in her chest and radiating up  to both shoulders and arms.  It was a sharp, crampy-type pain.  In the  emergency room, her shortness of breath was helped with Atrovent and  albuterol neb.  She did have an inch of Nitropaste applied to her chest.  Her chest pains have continued intermittently, but she is currently pain-  free.  She has not had a cough or sputum production.  There has been no  fever.  She does not say that she has gained weight or had lower  extremity edema.   She has had a good diuresis with Lasix in the ER.   She has not had a previous stress test.  I do not have clinic notes from  Dr. Georgiana Shore.   Her review of systems is otherwise  negative.  In particular, she has not  had weight loss or hemoptysis.  There was a question of an abnormal x-  ray in the past with hilar adenopathy seen on CT scan, February 22nd.   The scan was initially done to rule out PE.  She has not had followup  for this.  There is a question of an intracapsular breast prosthesis  rupture as well.   Her past medical history is otherwise remarkable for hypertension, not  well treated; history of decreased LV function by echo in 2009, EF 45-  50%; previous breast implants; multiple neck surgeries done up in the  [facility] area.   She denies any allergies.   She has not been taking medicines regularly.   She is supposed to be on simvastatin, carvedilol, aspirin, Ventolin, and  lisinopril.  She does not know  any dosages and has not been taking them  regularly.   Family history is noncontributory.   She is married.  I spoke with her husband today.  She does not work.  She does all activities of daily living.  She denies alcohol intake.  She smokes 2 packs a day.   PHYSICAL EXAMINATION:  GENERAL:  Her exam is remarkable for a somewhat  disheveled female.  She is anxious about being in the hospital.  VITAL SIGNS:  She is in sinus rhythm without ectopy at a rate of 88,  respirations are 18, blood pressure was initially 180/110.  With  Nitropaste and Lasix, it is now 150/80; sats are 100% on 2 L.  HEENT:  Unremarkable.  NECK:  Carotids are without bruit.  No lymphadenopathy, thyromegaly, or  JVP elevation.  She has bilateral anterior scars from her cervical  surgery.  LUNGS:  Have rhonchi and wheezing with basilar atelectasis.  HEART:  S1 and S2, normal heart sounds.  PMI not palpable.  I did not  examine her breasts.  ABDOMEN:  Benign.  Bowel sounds positive.  No AAA.  No tenderness.  No  bruit.  No hepatosplenomegaly.  No hepatojugular reflux, no tenderness.  She has a Foley in place.  Distal pulses were intact.  No edema.  NEURO:   Nonfocal.  SKIN:  Warm and dry.  No muscular weakness.   EKG shows sinus rhythm with LVH and left axis deviation.  No acute  changes.   Lab results are remarkable for troponin of 0.37.  CPK of 100 with an MB  of 4.6, BNP 1330.  Coags are normal.  CBC shows a white count of 8.8,  hematocrit 36.5.  An iSTAT showed a potassium of 3.4, sodium 142,  creatinine 1.8.  Chest x-ray shows hyperaerated lungs with COPD.  Cannot  exclude mild pulmonary vascular congestion.  There is no note of  adenopathy.   IMPRESSION:  1. Atypical chest pain in the setting of smoking, positive markers,      LVH on ECG.  Given the patient's known decreased LV function and      risk factors as well as her shortness of breath and an elevated      BNP, she needs a right and left heart cath.  I discussed this with      her.  She is a little bit hesitant but agrees.  The risks were      discussed.  She would like to be sedated for the procedure.  I      explained to her that we can give her Versed or Valium.  General      anesthesia is not given.  After a lengthy discussion with her and      her husband, she is willing to proceed with cath tomorrow.  We will      continue her beta-blocker and diuretics as well as an ACE      inhibitor.  She will be placed on heparin.  She has no bleeding      contraindications.  She will be started on aspirin as well.  2. Chronic obstructive pulmonary disease.  Encouraged smoking      cessation.  We will try to get baseline PFTs.  We will repeat a      noncontrast CT scan in the morning to assess for adenopathy with      high-resolution lung windows to assess her emphysema.  She will      have  albuterol nebs.  She may need a pulmonary consult while she is      here in the hospital.  3. Hypertension, suboptimally controlled due to noncompliance.  She      will be on beta-blocker, diuretic, and ACE inhibitors while here in      the hospital.  4. Question previously ruptured breast  implants.  Again, this will be      evaluated by CT scan in the morning.  5. Further recommendations in regards to her heart will be based on      her heart catheterization.      Noralyn Pick. Eden Emms, MD, [facility]  Electronically Signed     PCN/MEDQ  D:  07/19/2008  T:  07/20/2008  Job:  [id number]

## 2010-10-21 NOTE — Assessment & Plan Note (Signed)
OFFICE VISIT   [name], [name]  DOB:  11/29/1945                                       02/20/2009  ZOXWR#:[id number]   The patient is a 65 year old female referred by NP Mitzi Hansen from  [facility].  She is referred for evaluation of high-grade  carotid stenosis.  The patient was noted to have a bruit on recent  clinical exam and also was having some symptoms of dizziness.  She  denies any symptoms of TIA, amaurosis or stroke.  She has multiple  atherosclerotic risk factors including elevated cholesterol,  hypertension, coronary artery disease and smoking.  She had a myocardial  infarction in February of 2010 and had a coronary stent placed at that  time.  She states that she does occasionally have some weakness in her  right arm when working overhead that has been chronic in nature.  This  did not sound like a TIA type symptom.  She has had multiple operations  on her cervical spine for disk disease.   PAST MEDICAL HISTORY:  Is also remarkable for congestive heart failure  and COPD.   PAST SURGICAL HISTORY:  She had a left mastectomy and node dissection in  1992 for breast cancer.  She has had a ruptured disk in her lower back  and operation for this in '95.  She has had bilateral cervical disk  operations in 1990 and '95.  She had a cholecystectomy also.   MEDICATIONS:  1. Klor-Con 20 one tablet every day.  2. Simvastatin 80 mg once a day.  3. Carvedilol 6.25 mg twice a day.  4. Furosemide 40 mg once a day.  5. Cymbalta 20 mg once a day.  6. Plavix 75 mg once a day.  7. Lisinopril 20 mg once a day.  8. Ventolin 1 puff every 4-6 hours.  9. Aspirin 325 mg once a day.   ALLERGIES:  She has no known drug allergies.   FAMILY HISTORY:  Is remarkable for her mother and brothers who had  vascular disease at age less than 66.   SOCIAL HISTORY:  She is married.  She has one child.  She smokes one to  two packs of cigarettes per day.  I  spent greater than 5 minutes  counseling her on smoking cessation and possible techniques for this as  well as the health risks of smoking long-term.  She does not consume  alcohol regularly.   REVIEW OF SYSTEMS:  She is 5 feet 6 inches, 163 pounds.  She has had  some weight loss recently.  CARDIAC:  She has occasional dyspnea when lying flat and with exertion.  PULMONARY:  She has a productive cough with chronic bronchitis and  occasional wheezing.  GI:  She has a history of intermittent diarrhea and constipation.  Renal, vascular, hematologic review of systems are all negative.  NEUROLOGIC:  She has occasional dizzy spells which do not seem to be  precipitated by anything in particular.  ORTHOPEDIC:  She has multiple joint arthritis pain.  PSYCHIATRIC:  She has mild depression and anxiety.  ENT:  She has had some decline in her eyesight recently.   PHYSICAL EXAM:  Vital signs:  Blood pressure is 107/62 in the right arm,  pulse is 59 and regular.  HEENT:  Unremarkable.  Neck:  Has 2+ carotid  pulses  with a left-sided carotid bruit.  She has well-healed 2 inch  scars over the anterior triangle the neck consistent with cervical spine  operation at the inferior aspect of the left and right side of her neck.  Chest:  Has distant breath sounds bilaterally.  Cardiac:  Regular rate  and rhythm without murmur.  Abdomen:  Soft, nontender, nondistended.  No  masses.  Extremities:  She has 2+ brachial, radial, femoral pulses  bilaterally.  She has a 2+ right posterior tibial pulse.  She has absent  pedal pulses on the left side.  Feet are pink, warm and well-perfused.  Neurological:  Shows symmetric upper extremity and lower extremity motor  strength which is 5/5.  Cranial nerves II-XII are intact.   She had a carotid duplex scan performed today which showed a high-grade  left internal carotid artery stenosis greater than 80% with an end-  diastolic velocity of 154 cm/sec.  Carotid bifurcation  was in a normal  position with normal artery distal to this.  Vertebral flow was  antegrade bilaterally.  Right side showed a 40-60% stenosis.   I had a lengthy discussion with the patient today as far as risk factor  modification was concerned, especially her smoking.  In addition, I  believe she would benefit from left carotid endarterectomy for stroke  prophylaxis.  Risks, benefits, possible complications and procedure  details were explained to the patient today including but not limited to  bleeding, infection, cranial nerve injury, stroke risk of 1-2%.  She  understands and agrees to proceed.  Carotid endarterectomy is scheduled  for 03/05/2009.  We will stop her Plavix today but continue her aspirin  in anticipation of her operation.   Janetta Hora. Fields, MD  Electronically Signed   CEF/MEDQ  D:  02/20/2009  T:  02/21/2009  Job:  [id number]   cc:   Mitzi Hansen, NP  Veverly Fells. Excell Seltzer, MD  Noralyn Pick. Eden Emms, MD, [facility]

## 2010-10-21 NOTE — Discharge Summary (Signed)
[name], [name]              ACCOUNT NO.:  [id number]   MEDICAL RECORD NO.:  [id number]          PATIENT TYPE:  INP   LOCATION:  4731                         FACILITY:  [facility]   PHYSICIAN:  Leighton Roach McDiarmid, M.D.DATE OF BIRTH:  [birth year]   DATE OF ADMISSION:  07/31/2007  DATE OF DISCHARGE:  08/02/2007                               DISCHARGE SUMMARY   PRIMARY CARE PHYSICIAN:  Patient previously had no primary M.D.  She  will now follow up with Dr. Ardeen Garland at the [facility].   DISCHARGE DIAGNOSES:  1. Mild systolic congestive heart failure.  2. Chronic obstructive pulmonary disease.  3. Tobacco abuse.  4. Hyperlipidemia.  5. Hypertension.   DISCHARGE MEDICATIONS:  1. Omeprazole 40 mg daily.  2. Coreg 3.125 mg b.i.d.  3. Lisinopril 10 mg daily.  4. Aspirin 81 mg daily.  5. Zocor 40 mg q.h.s.  6. Prednisone 40 mg daily for 1 week.   BRIEF HPI:  [name] is a 65 year old female who presented  with a 30-month history of fatigue and progressive shortness of breath  which had strongly increased over the last 2 weeks.  She stated that the  shortness of breath was exertional.  She denied any paroxysmal nocturnal  dyspnea but did note some longstanding orthopnea.  She had not had a  primary care physician for over 30 years when her former primary M.D.  had left the area.  She endorsed some subjective fevers and some dark  brown sputum from the last 3 days prior to admission but no chest pain,  diaphoresis, syncope, or abdominal pain.  She denied any dysuria but she  had had some chronic loose stools for the last 6 months.  Labs on  admission did show an elevated BNP at 900 and an elevated D-dimer at  0.89.  A chest x-ray showed congestion with cephalization an a CT angio  was negative for PE but did show some bilateral hilar adenopathy and EKG  obtained showed sinus tachycardia.  She was admitted for further workup  of her dyspnea and  elevated BNP with possible CHF being in the  differential, as well as likely COPD given her extensive smoking  history.  She was noted to have a 3 pack a day smoking history for over  20 years but no drugs or alcohol.   BRIEF HOSPITAL COURSE:  1. Regarding her dyspnea, she was placed on oxygen and was started on      Lasix to diurese some fluid.  A 2D echo was obtained for further      evaluation of her elevated BNP which showed increased right      ventricular systolic pressure, increased pulmonary artery pressure,      a left ventricular ejection fraction of 45% to 50%, her left      ventricular was at the upper limits of normal size, and she did      have moderate to severe mitral valve regurgitation and mild to      moderate pulmonary hypertension.  She was also started on  prednisone, azithromycin, and albuterol.  Over the next 2 days, the      patient did symptomatically improve and was feeling much better on      the day of discharge.  2. Regarding her mild systolic congestive heart failure and      hypertension, she was started on an ACE inhibitor and Coreg for her      hypotension which should also help her mild CHF.  Her blood      pressure is well controlled on the day of discharge approximately      120s to 130s/60s.  3. Regarding her tobacco abuse, a smoking cessation consult was      obtained and patient was counseled by both M.D.'s and pharmacist      regarding the health benefits including smoking and the risks to      her health of smoking.  Despite all this, the patient is not ready      to quit yet and this will be further followed up in the outpatient      setting.  4. The patient was found to be hyperlipidemic.  She was started on      Zocor 40 and this can be followed up as an outpatient.   DISPOSITION:  The patient will be discharged home.   CONDITION AT TIME OF DISCHARGE:  Stable.  Patient is able to maintain  her oxygen saturation both at rest and while  ambulating off oxygen and  does feel back to normal today.   FOLLOWUP:  The patient has an appointment with Dr. Georgiana [facility] on March 3 at 2:50 in the afternoon.   FOLLOWUP ISSUES:  Include:  1. Toleration of her medicines and her ability to fill them.  2. Further discussion about smoking cessation.  3. Further exploration of a possible diagnosis of prediabetes as she      did have fasting blood glucose of 120 while in the hospital.  4. Patient will likely need to be referred for outpatient PFTs once      over this acute exacerbation.  5. Followup should be obtained of the hilar adenopathy found on CT      during this hospitalization to ensure this resolves and does not      need further workup.      Ardeen Garland, MD  Electronically Signed      Leighton Roach McDiarmid, M.D.  Electronically Signed    LM/MEDQ  D:  08/02/2007  T:  08/02/2007  Job:  [id number]

## 2010-11-14 ENCOUNTER — Other Ambulatory Visit: Payer: Self-pay | Admitting: Pulmonary Disease

## 2010-11-27 ENCOUNTER — Encounter: Payer: Self-pay | Admitting: Pulmonary Disease

## 2010-11-28 ENCOUNTER — Ambulatory Visit: Payer: Self-pay | Admitting: Pulmonary Disease

## 2010-12-01 ENCOUNTER — Telehealth: Payer: Self-pay | Admitting: Pulmonary Disease

## 2010-12-01 MED ORDER — TIOTROPIUM BROMIDE MONOHYDRATE 18 MCG IN CAPS: 18.0000 ug | ORAL_CAPSULE | Freq: Every day | RESPIRATORY_TRACT | Status: DC

## 2010-12-01 MED ORDER — BUDESONIDE-FORMOTEROL FUMARATE 160-4.5 MCG/ACT IN AERO: 2.0000 | INHALATION_SPRAY | Freq: Two times a day (BID) | RESPIRATORY_TRACT | Status: DC
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
## 2010-12-19 ENCOUNTER — Encounter: Payer: Self-pay | Admitting: Pulmonary Disease

## 2010-12-19 ENCOUNTER — Ambulatory Visit (INDEPENDENT_AMBULATORY_CARE_PROVIDER_SITE_OTHER): Payer: Medicare Other | Admitting: Pulmonary Disease

## 2010-12-19 VITALS — BP 120/60 | HR 61 | Temp 98.9°F | Ht 66.5 in | Wt 196.0 lb

## 2010-12-19 DIAGNOSIS — J449 Chronic obstructive pulmonary disease, unspecified: Secondary | ICD-10-CM

## 2010-12-19 NOTE — Patient Instructions (Signed)
No change in meds for your emphysema Stop smoking if you can Try chlorpheniramine 8mg  over the counter at bedtime for your postnasal drip. followup with me in 6mos

## 2010-12-19 NOTE — Progress Notes (Signed)
  Subjective:    Patient ID: [name], female    DOB: 01-Aug-1945, 65 y.o.   MRN: [id number]  HPI The pt comes in today for f/u of her known emphysema.  She has been maintaining on her BD regimen, but unfortunately still smokes a pack a day.  She feels her breathing is unchanged from the last visit, with the same exertional tolerance.  She has her usual smokers cough, but has not had an acute flare or recent chest infection.    Review of Systems  Constitutional: Negative for fever and unexpected weight change.  HENT: Positive for sneezing and sinus pressure. Negative for ear pain, nosebleeds, congestion, sore throat, rhinorrhea, trouble swallowing, dental problem and postnasal drip.   Eyes: Negative for redness and itching.  Respiratory: Positive for cough and shortness of breath. Negative for chest tightness and wheezing.   Cardiovascular: Negative for palpitations and leg swelling.  Gastrointestinal: Negative for nausea and vomiting.  Genitourinary: Negative for dysuria.  Musculoskeletal: Negative for joint swelling.  Skin: Negative for rash.  Neurological: Negative for headaches.  Hematological: Bruises/bleeds easily.  Psychiatric/Behavioral: Positive for dysphoric mood. The patient is not nervous/anxious.        Objective:   Physical Exam Ow female in nad Chest with decreased bs, no wheezing or rhonchi Cor with rrr LE without edema, no cyanosis noted.  Alert, oriented, moves all 4        Assessment & Plan:

## 2010-12-23 NOTE — Assessment & Plan Note (Signed)
The pt is unchanged from the last visit wrt symptoms and exertional tolerance.  Unfortunately she is continuing to smoke, and I have told her she cannot expect improvement until she quits.  I have asked her to continue on her current meds, and to work on smoking cessation.

## 2011-01-26 ENCOUNTER — Encounter: Payer: Self-pay | Admitting: Cardiovascular Disease

## 2011-01-26 ENCOUNTER — Ambulatory Visit (INDEPENDENT_AMBULATORY_CARE_PROVIDER_SITE_OTHER): Payer: Medicare Other | Admitting: Cardiovascular Disease

## 2011-01-26 DIAGNOSIS — F17200 Nicotine dependence, unspecified, uncomplicated: Secondary | ICD-10-CM

## 2011-01-26 DIAGNOSIS — I2510 Atherosclerotic heart disease of native coronary artery without angina pectoris: Secondary | ICD-10-CM

## 2011-01-26 DIAGNOSIS — R0989 Other specified symptoms and signs involving the circulatory and respiratory systems: Secondary | ICD-10-CM

## 2011-01-26 MED ORDER — BUPROPION HCL ER (XL) 150 MG PO TB24: 150.0000 mg | ORAL_TABLET | Freq: Every day | ORAL | Status: DC

## 2011-01-26 NOTE — Assessment & Plan Note (Signed)
Cholesterol is at goal.  Continue current dose of statin and diet Rx.  No myalgias or side effects.  F/U  LFT's in 6 months. Lab Results  Component Value Date   LDLCALC 124 08/01/2007  Counseled on diet and copmliance with statin

## 2011-01-26 NOTE — Patient Instructions (Signed)
Your physician wants you to follow-up in: 12 months.   You will receive a reminder letter in the mail two months in advance. If you don't receive a letter, please call our office to schedule the follow-up appointment.   Your physician has recommended you make the following change in your medication:  Start Wellbutrin once daily    Your physician has requested that you have a carotid duplex. This test is an ultrasound of the carotid arteries in your neck. It looks at blood flow through these arteries that supply the brain with blood. Allow one hour for this exam. There are no restrictions or special instructions.

## 2011-01-26 NOTE — Progress Notes (Signed)
[name] is a previous patient of Dr Elease Hashimoto. She had a BMS placed to the RCA in February 2010. I could not find a cath report in echart. She subsequently had a left CEA by Dr. Darrick Penna in September 2010. 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 she has not tried to quit. We discussed this at length. She is willing to try Chantix and already has a script I gave her last time. She was referred r to pulmonary. She saw Dr Shelle Iron and was started on Spireva. PFT;s showed moderate obstruction, moderate decrease in DLCO and no bronchodilater response. She got a pneumo vax in the hospital but is hesitant to get a flu shot. She has not had any recurrent SSCP, palpitations, or edema. 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 She has not had a cough or sputum production. She also appears to have chronic pain syndrome. She does not work. CRF include elevated lipids, HTN and smoking. She has clinical Raynaud's disease with some upper extremity symptoms and blanching of the hands in cold weather. She needs a new script for nitro that we will call into [facility] in [place]   Husband passed 3 weeks ago.  Still smoking Cannot afford Chantix With depression Welbutrin would be good.  Willing to try.  Counseled on cessation for less than 10 minutes  Needs F/U carotid duplex  ROS: Denies fever, malais, weight loss, blurry vision, decreased visual acuity, cough, sputum, SOB, hemoptysis, pleuritic pain, palpitaitons, heartburn, abdominal pain, melena, lower extremity edema, claudication, or rash.  All other systems reviewed and negative  General: Affect appropriate Healthy:  appears stated age HEENT: normal Neck supple with no adenopathy JVP normal left CEA with bilateral bruits no thyromegaly Lungs clear with no wheezing and good diaphragmatic motion Heart:  S1/S2 no murmur,rub, gallop or click PMI normal Abdomen: benighn, BS positve, no tenderness, no AAA no bruit.  No HSM or HJR Distal pulses intact with no  bruits No edema Neuro non-focal Skin warm and dry No muscular weakness   Current Outpatient Prescriptions  Medication Sig Dispense Refill  . albuterol (PROAIR HFA) 108 (90 BASE) MCG/ACT inhaler Inhale 2 puffs into the lungs every 6 (six) hours as needed.        . budesonide-formoterol (SYMBICORT) 160-4.5 MCG/ACT inhaler Inhale 2 puffs into the lungs 2 (two) times daily.  1 Inhaler  0  . carvedilol (COREG) 6.25 MG tablet Take 6.25 mg by mouth 2 (two) times daily with a meal.        . Cholecalciferol (VITAMIN D3) 1000 UNITS CAPS Take 1 capsule by mouth daily.        . clopidogrel (PLAVIX) 75 MG tablet Take 75 mg by mouth daily.        . DULoxetine (CYMBALTA) 20 MG capsule Take 20 mg by mouth daily.        . fluticasone (FLONASE) 50 MCG/ACT nasal spray Place 2 sprays into the nose daily.        . furosemide (LASIX) 40 MG tablet Take 40 mg by mouth daily.        . hydrocodone-acetaminophen (LORCET-HD) 5-500 MG per capsule Take 1 capsule by mouth every 4 (four) hours as needed.        Marland Kitchen lisinopril (PRINIVIL,ZESTRIL) 20 MG tablet Take 20 mg by mouth daily.        . potassium chloride (KLOR-CON) 20 MEQ packet Take 20 mEq by mouth daily.        . simvastatin (ZOCOR) 80 MG tablet Take  80 mg by mouth at bedtime.        Marland Kitchen tiotropium (SPIRIVA HANDIHALER) 18 MCG inhalation capsule Place 1 capsule (18 mcg total) into inhaler and inhale daily.  30 capsule  0    Allergies  Review of patient's allergies indicates no known allergies.  Electrocardiogram:  NSR 61 Nonspecific ST/T wave changes  Assessment and Plan

## 2011-01-26 NOTE — Assessment & Plan Note (Signed)
F/U carotid duplex.  Residual bilateral bruits

## 2011-01-26 NOTE — Assessment & Plan Note (Signed)
Well controlled.  Continue current medications and low sodium Dash type diet.    

## 2011-01-26 NOTE — Assessment & Plan Note (Signed)
Stable no angina continue ASA and beta blocker

## 2011-01-26 NOTE — Assessment & Plan Note (Signed)
Welbutrin.  Not likely to quit.  Has not tried in 25 years and has smoked despite COPD and CAD

## 2011-01-26 NOTE — Assessment & Plan Note (Signed)
Continue inhalers and FU Dr Shelle Iron No active wheezing on exam today

## 2011-02-04 ENCOUNTER — Other Ambulatory Visit: Payer: Self-pay | Admitting: Pulmonary Disease

## 2011-02-18 ENCOUNTER — Encounter: Payer: Medicare Other | Admitting: Cardiology

## 2011-03-02 LAB — DIFFERENTIAL
Eosinophils Absolute: 0
Eosinophils Relative: 0
Lymphocytes Relative: 6 — ABNORMAL LOW
Lymphs Abs: 0.6 — ABNORMAL LOW
Monocytes Absolute: 0.1
Monocytes Relative: 1 — ABNORMAL LOW

## 2011-03-02 LAB — COMPREHENSIVE METABOLIC PANEL
ALT: 19
AST: 21
Albumin: 3.9
CO2: 26
Calcium: 8.9
Creatinine, Ser: 0.78
GFR calc Af Amer: >60
GFR calc non Af Amer: >60
Sodium: 138
Total Protein: 7.1

## 2011-03-02 LAB — TROPONIN I: Troponin I: 0.03

## 2011-03-02 LAB — SAVE SMEAR

## 2011-03-02 LAB — BASIC METABOLIC PANEL
BUN: 14
BUN: 5 — ABNORMAL LOW
CO2: 26
CO2: 31
Chloride: 100
Creatinine, Ser: 0.75
GFR calc non Af Amer: >60
Glucose, Bld: 120 — ABNORMAL HIGH
Potassium: 3.5
Potassium: 3.9

## 2011-03-02 LAB — CARDIAC PANEL(CRET KIN+CKTOT+MB+TROPI)
CK, MB: 1.9
CK, MB: 2.8
Relative Index: INVALID
Total CK: 147

## 2011-03-02 LAB — CK TOTAL AND CKMB (NOT AT ARMC): CK, MB: 1.5

## 2011-03-02 LAB — URINALYSIS, ROUTINE W REFLEX MICROSCOPIC
Glucose, UA: NEGATIVE
Leukocytes, UA: NEGATIVE
Specific Gravity, Urine: 1.006
Urobilinogen, UA: 0.2

## 2011-03-02 LAB — URINE MICROSCOPIC-ADD ON

## 2011-03-02 LAB — HIV ANTIBODY (ROUTINE TESTING W REFLEX): HIV: NONREACTIVE

## 2011-03-02 LAB — CBC
MCHC: 33.1
MCV: 82.8
Platelets: 211
RBC: 4.57

## 2011-03-02 LAB — D-DIMER, QUANTITATIVE: D-Dimer, Quant: 0.82 — ABNORMAL HIGH

## 2011-03-02 LAB — LIPID PANEL
Cholesterol: 166
HDL: 29 — ABNORMAL LOW
LDL Cholesterol: 124 — ABNORMAL HIGH
Triglycerides: 67

## 2011-03-10 ENCOUNTER — Other Ambulatory Visit: Payer: Self-pay | Admitting: Pulmonary Disease

## 2011-03-13 ENCOUNTER — Encounter (INDEPENDENT_AMBULATORY_CARE_PROVIDER_SITE_OTHER): Payer: Medicare Other | Admitting: *Deleted

## 2011-03-13 DIAGNOSIS — R0989 Other specified symptoms and signs involving the circulatory and respiratory systems: Secondary | ICD-10-CM

## 2011-03-13 DIAGNOSIS — I6529 Occlusion and stenosis of unspecified carotid artery: Secondary | ICD-10-CM

## 2011-03-17 ENCOUNTER — Telehealth: Payer: Self-pay | Admitting: Cardiovascular Disease

## 2011-03-17 NOTE — Telephone Encounter (Signed)
Spoke with pt, aware of carotid results [name]  

## 2011-03-17 NOTE — Telephone Encounter (Signed)
Returning your call. °

## 2011-06-19 IMAGING — CR DG CHEST 1V PORT
1 series · 1 of 1 positions shown · non-contrast
Comparison: 10/31/2009.

CLINICAL DATA: Hypotension and weakness.

PORTABLE CHEST - 1 VIEW

[AP]
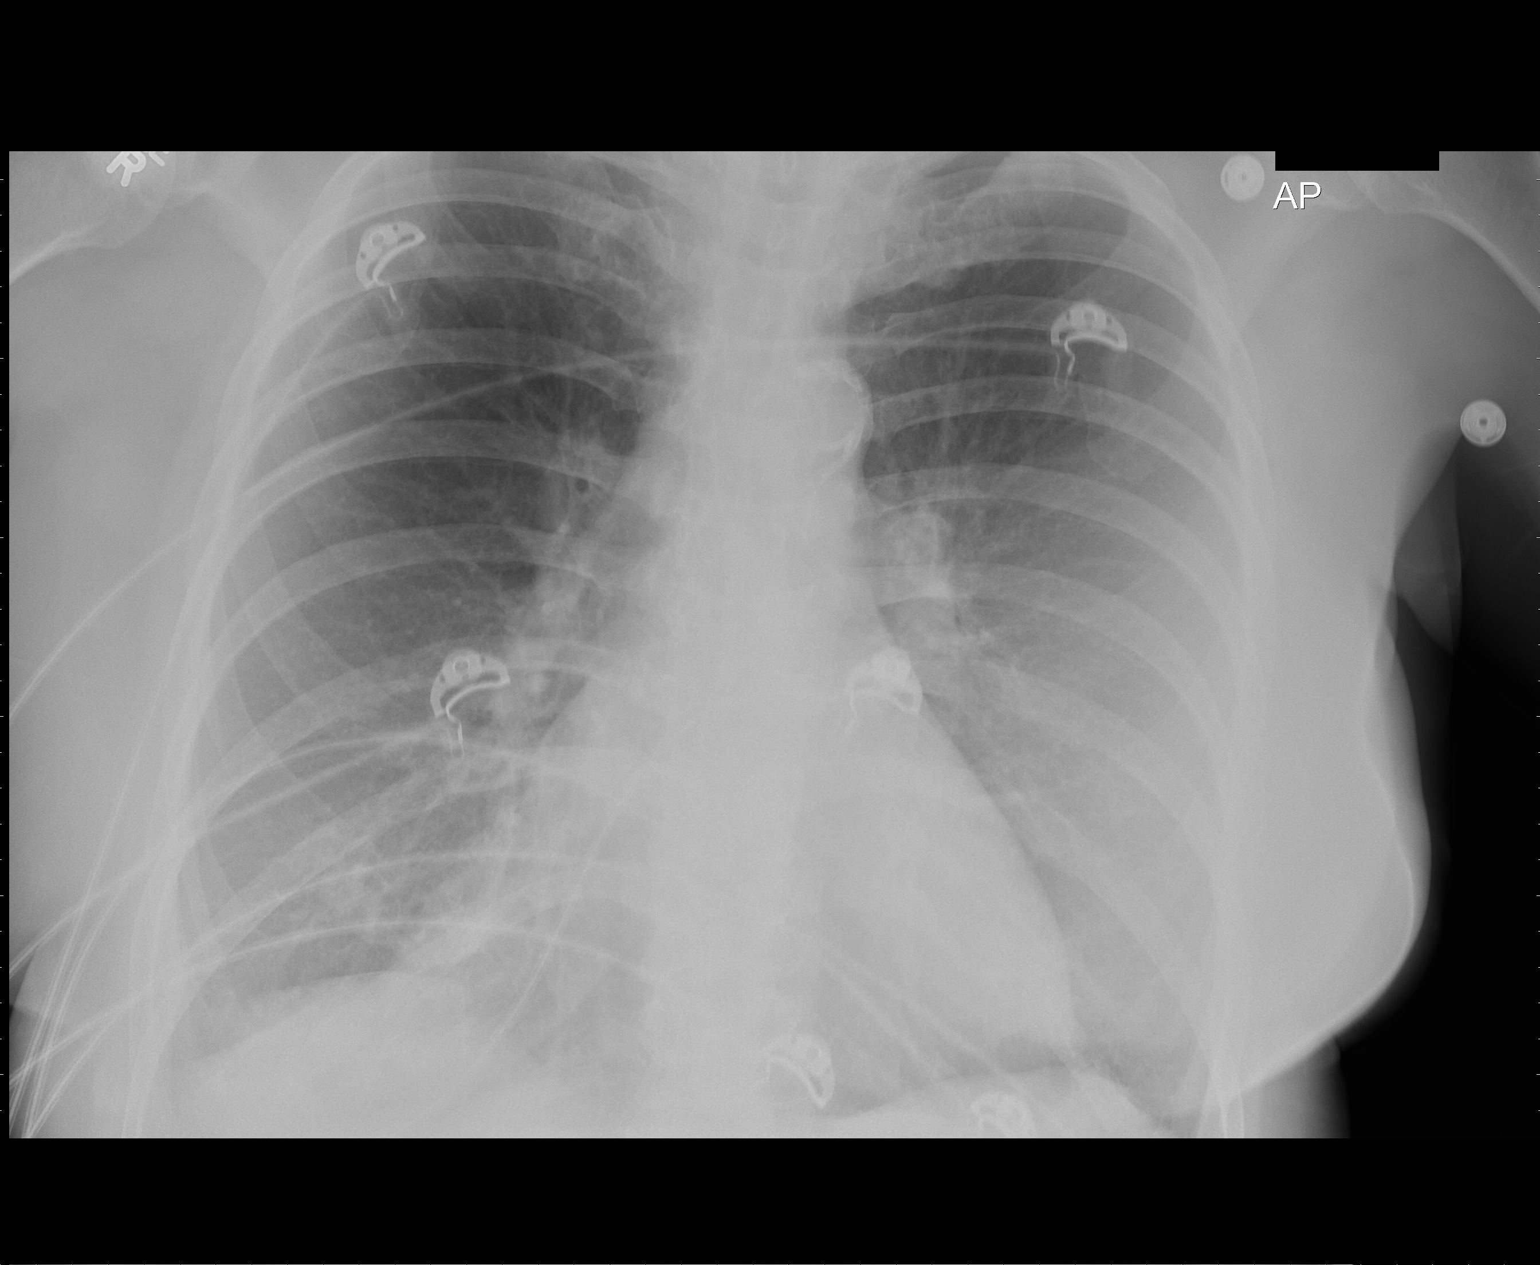

[1 of 1 positions shown; findings below may reference images not displayed]

FINDINGS: Heart size is within normal limits and there is no heart
failure.  The lungs are clear without infiltrate or effusion.
IMPRESSION: No acute radiographic abnormality.

## 2011-08-12 ENCOUNTER — Other Ambulatory Visit: Payer: Self-pay | Admitting: Pulmonary Disease

## 2011-08-28 ENCOUNTER — Ambulatory Visit (INDEPENDENT_AMBULATORY_CARE_PROVIDER_SITE_OTHER): Payer: Medicare Other | Admitting: Pulmonary Disease

## 2011-08-28 ENCOUNTER — Encounter: Payer: Self-pay | Admitting: Pulmonary Disease

## 2011-08-28 VITALS — BP 140/60 | HR 60 | Temp 98.5°F | Ht 66.0 in | Wt 189.0 lb

## 2011-08-28 DIAGNOSIS — J449 Chronic obstructive pulmonary disease, unspecified: Secondary | ICD-10-CM

## 2011-08-28 NOTE — Progress Notes (Signed)
  Subjective:    Patient ID: [name], female    DOB: [birth year], 66 y.o.   MRN: [id number]  HPI The patient comes in today for followup of her known COPD.  She is maintaining on her bronchodilator regimen, but unfortunately is still smoking.  She feels that her exertional tolerance is at her usual baseline, and she has not had a recent chest infection.  She has not been overusing her rescue inhaler.   Review of Systems  Constitutional: Negative for fever and unexpected weight change.  HENT: Positive for congestion, sore throat, rhinorrhea, sneezing and postnasal drip. Negative for ear pain, nosebleeds, trouble swallowing, dental problem and sinus pressure.   Eyes: Positive for itching. Negative for redness.  Respiratory: Positive for cough. Negative for chest tightness, shortness of breath and wheezing.   Cardiovascular: Negative for palpitations and leg swelling.  Gastrointestinal: Negative for nausea and vomiting.  Genitourinary: Negative for dysuria.  Musculoskeletal: Positive for joint swelling.  Skin: Negative for rash.  Neurological: Negative for headaches.  Hematological: Does not bruise/bleed easily.  Psychiatric/Behavioral: Negative for dysphoric mood. The patient is not nervous/anxious.        Objective:   Physical Exam Overweight female in no acute distress Nose without purulence or discharge noted Chest with decreased breath sounds, but no wheezes or rhonchi Cardiac exam with regular rate and rhythm Lower extremities with no significant edema, no cyanosis noted Alert and oriented, moves all 4 extremities.        Assessment & Plan:

## 2011-08-28 NOTE — Patient Instructions (Signed)
Continue on your current medications Work hard on cutting back or quitting smoking Get outside and work on conditioning when weather gets better.  Think about swimming.  followup with me in 6mos, or sooner if you are having breathing problems.

## 2011-08-28 NOTE — Assessment & Plan Note (Signed)
The patient is at a stable baseline on her current bronchodilator regimen.  She has not had a recurrent exacerbation or chest infection, but unfortunately continues to smoke.  I have asked her to stop smoking, and also to maintain on her current inhalers.  I've also encouraged her to work on weight loss and conditioning.

## 2011-09-10 ENCOUNTER — Encounter: Payer: Self-pay | Admitting: *Deleted

## 2011-11-03 ENCOUNTER — Other Ambulatory Visit: Payer: Self-pay | Admitting: Pulmonary Disease

## 2012-01-19 ENCOUNTER — Telehealth: Payer: Self-pay | Admitting: *Deleted

## 2012-01-19 NOTE — Telephone Encounter (Signed)
NEED TO RESCHEDULE 02-04-12  APPT  MD SCHEDULE CHANGE .Zack Seal

## 2012-01-20 NOTE — Telephone Encounter (Signed)
lmtcb once again./cy

## 2012-01-26 NOTE — Telephone Encounter (Signed)
DAUGHTER AWARE OF THE NEED TO CHANGE APPT WILL CALL ONCE KNOWS OWN SCHEDULE DAUGHTER BRINGS MOM TO HER APPTS./CY

## 2012-02-01 ENCOUNTER — Other Ambulatory Visit: Payer: Self-pay | Admitting: Pulmonary Disease

## 2012-02-04 ENCOUNTER — Ambulatory Visit: Payer: Medicare Other | Admitting: Cardiovascular Disease

## 2012-04-01 ENCOUNTER — Encounter: Payer: Self-pay | Admitting: Pulmonary Disease

## 2012-04-01 ENCOUNTER — Ambulatory Visit (INDEPENDENT_AMBULATORY_CARE_PROVIDER_SITE_OTHER): Payer: Medicare Other | Admitting: Pulmonary Disease

## 2012-04-01 VITALS — BP 130/58 | HR 60 | Temp 98.6°F | Ht 66.0 in | Wt 184.8 lb

## 2012-04-01 DIAGNOSIS — J449 Chronic obstructive pulmonary disease, unspecified: Secondary | ICD-10-CM

## 2012-04-01 NOTE — Patient Instructions (Addendum)
Stay on your inhaler medications Work on stopping smoking followup with me in 6mos.

## 2012-04-01 NOTE — Assessment & Plan Note (Signed)
The patient is not far from her usual baseline, and I have told her that she can expect a little more chest congestion, cough, and mucus as long as she continues to smoke.  At least she has not had an acute exacerbation or chest infection since the last visit.  She does feel her bronchodilator regimen is helping her.  I have asked her to continue on her medications, and work hard on smoking cessation

## 2012-04-01 NOTE — Progress Notes (Signed)
  Subjective:    Patient ID: [name], female    DOB: 01/30/1946, 66 y.o.   MRN: [id number]  HPI The patient comes in today for followup of her known moderate COPD.  She is staying on her bronchodilator regimen, but unfortunately continues to smoke.  She feels her inhalers have benefited her, but still has some chest congestion and cough with nonpurulent mucus.  I have told her this is expected with her ongoing smoking.  She has not had an acute exacerbation since her last visit.   Review of Systems  Constitutional: Negative for fever and unexpected weight change.  HENT: Positive for sore throat and postnasal drip. Negative for ear pain, nosebleeds, congestion, rhinorrhea, sneezing, trouble swallowing, dental problem and sinus pressure.   Eyes: Negative for redness and itching.  Respiratory: Positive for cough, chest tightness, shortness of breath and wheezing.   Cardiovascular: Negative for palpitations and leg swelling.  Gastrointestinal: Negative for nausea and vomiting.  Genitourinary: Negative for dysuria.  Musculoskeletal: Negative for joint swelling.  Skin: Negative for rash.  Neurological: Positive for headaches.  Hematological: Bruises/bleeds easily.  Psychiatric/Behavioral: Positive for dysphoric mood. The patient is nervous/anxious.        Objective:   Physical Exam Overweight female in no acute distress Nose without purulence or discharge noted Chest with mild decrease in breath sounds with a few rhonchi, otherwise no wheezing Cardiac exam with regular rate and rhythm Lower extremities without edema, no cyanosis Alert and oriented, moves all 4 extremities.       Assessment & Plan:

## 2012-04-22 ENCOUNTER — Ambulatory Visit (INDEPENDENT_AMBULATORY_CARE_PROVIDER_SITE_OTHER): Payer: Medicare Other | Admitting: Cardiovascular Disease

## 2012-04-22 ENCOUNTER — Encounter: Payer: Self-pay | Admitting: Cardiovascular Disease

## 2012-04-22 VITALS — BP 132/56 | HR 59 | Ht 66.0 in | Wt 186.0 lb

## 2012-04-22 DIAGNOSIS — I2510 Atherosclerotic heart disease of native coronary artery without angina pectoris: Secondary | ICD-10-CM

## 2012-04-22 DIAGNOSIS — I6529 Occlusion and stenosis of unspecified carotid artery: Secondary | ICD-10-CM

## 2012-04-22 DIAGNOSIS — E785 Hyperlipidemia, unspecified: Secondary | ICD-10-CM

## 2012-04-22 DIAGNOSIS — I25810 Atherosclerosis of coronary artery bypass graft(s) without angina pectoris: Secondary | ICD-10-CM

## 2012-04-22 DIAGNOSIS — J449 Chronic obstructive pulmonary disease, unspecified: Secondary | ICD-10-CM

## 2012-04-22 DIAGNOSIS — I10 Essential (primary) hypertension: Secondary | ICD-10-CM

## 2012-04-22 NOTE — Assessment & Plan Note (Signed)
Stable with no angina and good activity level.  Continue medical Rx  

## 2012-04-22 NOTE — Assessment & Plan Note (Signed)
No active wheezing F/U Clance has had flu shot.  Disucussed smoking cessation and use of E-cig with patient

## 2012-04-22 NOTE — Assessment & Plan Note (Signed)
Cholesterol is at goal.  Continue current dose of statin and diet Rx.  No myalgias or side effects.  F/U  LFT's in 6 months. Lab Results  Component Value Date   LDLCALC  Value: 124        Total Cholesterol/HDL:CHD Risk Coronary Heart Disease Risk Table                     Men   Women  1/2 Average Risk   3.4   3.3* 08/01/2007             

## 2012-04-22 NOTE — Assessment & Plan Note (Addendum)
Well controlled.  Continue current medications and low sodium Dash type diet.    

## 2012-04-22 NOTE — Progress Notes (Signed)
Patient ID: [name], female   DOB: June 02, 1946, 66 y.o.   MRN: [id number] [name] is a previous patient of [name]. She had a BMS placed to the RCA in February 2010. I could not find a cath report in echart. She subsequently had a left CEA by [name] in September 2010. 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 she has not tried to quit. We discussed this at length. She is willing to try Chantix and already has a script I gave her last time. She was referred r to pulmonary. She saw [name] and was started on Spireva. PFT;s showed moderate obstruction, moderate decrease in DLCO and no bronchodilater response. She got a pneumo vax in the hospital but is hesitant to get a flu shot. She has not had any recurrent SSCP, palpitations, or edema. 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 She has not had a cough or sputum production. She also appears to have chronic pain syndrome. She does not work. CRF include elevated lipids, HTN and smoking. She has clinical Raynaud's disease with some upper extremity symptoms and blanching of the hands in cold weather. She needs a new script for nitro that we will call into [facility] in [place]  Husband passed 01/2011  . Still smoking Cannot afford Chantix With depression Welbutrin would be good. Willing to try. Counseled on cessation for less than 10 minutes  Needs F/U carotid duplex  10/12 had 60-79% bilateral disease  ROS: Denies fever, malais, weight loss, blurry vision, decreased visual acuity, cough, sputum, SOB, hemoptysis, pleuritic pain, palpitaitons, heartburn, abdominal pain, melena, lower extremity edema, claudication, or rash.  All other systems reviewed and negative  General: Affect appropriate Chronically ill smoker HEENT: normal Neck supple with no adenopathy JVP normalbilateal  Bruits previous left CEA  no thyromegaly Lungs clear with no wheezing and good diaphragmatic motion Heart:  S1/S2 no murmur, no rub, gallop or click PMI  normal Abdomen: benighn, BS positve, no tenderness, no AAA no bruit.  No HSM or HJR Distal pulses intact with no bruits No edema Neuro non-focal Skin warm and dry No muscular weakness   Current Outpatient Prescriptions  Medication Sig Dispense Refill  . albuterol (PROAIR HFA) 108 (90 BASE) MCG/ACT inhaler Inhale 2 puffs into the lungs every 6 (six) hours as needed.        . carvedilol (COREG) 6.25 MG tablet Take 6.25 mg by mouth 2 (two) times daily with a meal.        . clopidogrel (PLAVIX) 75 MG tablet Take 75 mg by mouth daily.        . DULoxetine (CYMBALTA) 20 MG capsule Take 20 mg by mouth daily.        . furosemide (LASIX) 40 MG tablet Take 40 mg by mouth daily.        . hydrocodone-acetaminophen (LORCET-HD) 5-500 MG per capsule Take 1 capsule by mouth every 4 (four) hours as needed.        Marland Kitchen lisinopril (PRINIVIL,ZESTRIL) 20 MG tablet Take 20 mg by mouth daily.        . Multiple Vitamin (MULTI-VITAMIN DAILY PO) Take by mouth.      . potassium chloride (KLOR-CON) 20 MEQ packet Take 20 mEq by mouth daily.        . simvastatin (ZOCOR) 80 MG tablet Take 80 mg by mouth at bedtime.        Marland Kitchen SPIRIVA HANDIHALER 18 MCG inhalation capsule INHALE ONE DOSE BY MOUTH EVERY DAY  30 each  6  . SYMBICORT 160-4.5 MCG/ACT inhaler INHALE TWO PUFFS BY MOUTH TWICE DAILY  11 g  4  . [DISCONTINUED] buPROPion (WELLBUTRIN XL) 150 MG 24 hr tablet Take 1 tablet (150 mg total) by mouth daily.  30 tablet  3  . [DISCONTINUED] fluticasone (FLONASE) 50 MCG/ACT nasal spray Place 2 sprays into the nose daily.          Allergies  Review of patient's allergies indicates no known allergies.  Electrocardiogram:  NSR rate 59  IVCD inferolateral T wave changes  Assessment and Plan

## 2012-04-22 NOTE — Assessment & Plan Note (Signed)
No TIAs  Distant history of ulcer resume 81 mg ASA qod  Duplex next week

## 2012-04-22 NOTE — Patient Instructions (Addendum)
Your physician wants you to follow-up in: 6 MONTHS WITH DR Haywood Filler will receive a reminder letter in the mail two months in advance. If you don't receive a letter, please call our office to schedule the follow-up appointment. Your physician recommends that you continue on your current medications as directed. Please refer to the Current Medication list given to you today.  Your physician has requested that you have a carotid duplex. This test is an ultrasound of the carotid arteries in your neck. It looks at blood flow through these arteries that supply the brain with blood. Allow one hour for this exam. There are no restrictions or special instructions. DX  [facility]

## 2012-04-29 ENCOUNTER — Encounter (INDEPENDENT_AMBULATORY_CARE_PROVIDER_SITE_OTHER): Payer: Medicare Other

## 2012-04-29 DIAGNOSIS — I6529 Occlusion and stenosis of unspecified carotid artery: Secondary | ICD-10-CM

## 2012-09-21 ENCOUNTER — Telehealth: Payer: Self-pay | Admitting: Pulmonary Disease

## 2012-09-21 NOTE — Telephone Encounter (Signed)
Attempted to call pt x's 3 to make next ov per recall.  PT never returned calls.  Mailed recall letter 09/21/12. Emily E McAlister °

## 2012-10-28 ENCOUNTER — Encounter (INDEPENDENT_AMBULATORY_CARE_PROVIDER_SITE_OTHER): Payer: Medicare Other

## 2012-10-28 DIAGNOSIS — I6529 Occlusion and stenosis of unspecified carotid artery: Secondary | ICD-10-CM

## 2012-11-02 ENCOUNTER — Telehealth: Payer: Self-pay | Admitting: Cardiovascular Disease

## 2012-11-02 NOTE — Telephone Encounter (Signed)
New Prob ° ° ° ° ° °Pt calling in following up on test results. Please call. °

## 2012-11-02 NOTE — Telephone Encounter (Signed)
Reported patient's carotid doppler results; patient verbalized understanding.  I have ordered repeat for 6 months and will send to [facility] for them to schedule.  Patient states she has difficulty making appointments too far ahead.  I advised her to discuss this with [facility] when they call for appointment.

## 2012-11-25 ENCOUNTER — Ambulatory Visit: Payer: Medicare Other | Admitting: Cardiovascular Disease

## 2012-12-23 ENCOUNTER — Ambulatory Visit (INDEPENDENT_AMBULATORY_CARE_PROVIDER_SITE_OTHER): Payer: Medicare Other | Admitting: Pulmonary Disease

## 2012-12-23 ENCOUNTER — Encounter: Payer: Self-pay | Admitting: Pulmonary Disease

## 2012-12-23 VITALS — BP 122/60 | HR 63 | Temp 98.0°F | Ht 66.5 in | Wt 177.2 lb

## 2012-12-23 DIAGNOSIS — J449 Chronic obstructive pulmonary disease, unspecified: Secondary | ICD-10-CM

## 2012-12-23 NOTE — Patient Instructions (Addendum)
Stop smoking.  This is the key to staying well. Fill out patient assistance paperwork for your spiriva and symbicort and send in. Stay on albuterol as needed. followup with me in 6mos.

## 2012-12-23 NOTE — Progress Notes (Signed)
  Subjective:    Patient ID: [name], female    DOB: 1945-11-08, 67 y.o.   MRN: [id number]  HPI The patient comes in today for followup of her known COPD.  Unfortunately she continues to smoke, and feels that she cannot afford her medications.  She has had increasing shortness of breath since being out of her medicines for the last 6 months.  She has mild cough with nonpurulent mucus.   Review of Systems  Constitutional: Negative for fever and unexpected weight change.  HENT: Positive for congestion, rhinorrhea and sneezing. Negative for ear pain, nosebleeds, sore throat, trouble swallowing, dental problem, postnasal drip and sinus pressure.   Eyes: Negative for redness and itching.  Respiratory: Positive for cough, chest tightness, shortness of breath and wheezing.   Cardiovascular: Negative for palpitations and leg swelling.  Gastrointestinal: Negative for nausea and vomiting.  Genitourinary: Negative for dysuria.  Musculoskeletal: Negative for joint swelling.  Skin: Negative for rash.  Neurological: Negative for headaches.  Hematological: Does not bruise/bleed easily.  Psychiatric/Behavioral: Negative for dysphoric mood. The patient is not nervous/anxious.        Objective:   Physical Exam Overweight female in no acute distress Nose without purulence or discharge noted Neck without lymphadenopathy or thyromegaly Chest with decreased breath sounds, rhonchi throughout, but no active wheezing Cardiac exam with regular rate and rhythm Lower extremities without edema, no cyanosis Alert and oriented, moves all 4 extremities.       Assessment & Plan:

## 2012-12-23 NOTE — Assessment & Plan Note (Signed)
The patient has moderate to severe COPD by her breathing studies, but unfortunately continues to smoke.  She is now having increased shortness of breath since being off her maintenance inhalers, and tells me that she cannot afford the medications.  I have explained to her that she can take the money she would gain from smoking cessation, and use this for her inhalers.  I will also give her the paperwork for patient assistance programs.  Finally, I stressed to her the importance of total smoking cessation.

## 2013-02-17 ENCOUNTER — Ambulatory Visit (INDEPENDENT_AMBULATORY_CARE_PROVIDER_SITE_OTHER): Payer: Medicare Other | Admitting: Cardiovascular Disease

## 2013-02-17 ENCOUNTER — Encounter: Payer: Self-pay | Admitting: Cardiovascular Disease

## 2013-02-17 VITALS — BP 140/60 | HR 65 | Ht 66.5 in | Wt 173.0 lb

## 2013-02-17 DIAGNOSIS — F17200 Nicotine dependence, unspecified, uncomplicated: Secondary | ICD-10-CM

## 2013-02-17 DIAGNOSIS — I2510 Atherosclerotic heart disease of native coronary artery without angina pectoris: Secondary | ICD-10-CM

## 2013-02-17 DIAGNOSIS — I6529 Occlusion and stenosis of unspecified carotid artery: Secondary | ICD-10-CM

## 2013-02-17 NOTE — Assessment & Plan Note (Signed)
No change in bruit F/U duplex in November Continue ASA and Plavix  60-79% LICA

## 2013-02-17 NOTE — Assessment & Plan Note (Signed)
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  Encouraged her to get her Flue shot.  Will try to get her in to see a different [facility] doctor

## 2013-02-17 NOTE — Progress Notes (Signed)
Patient ID: [name], female   DOB: 30-Sep-1945, 67 y.o.   MRN: [id number] [name] is a previous patient of Dr Elease Hashimoto. She had a BMS placed to the RCA in February 2010. I could not find a cath report in echart. She subsequently had a left CEA by Dr. Darrick Penna in September 2010. 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 she has not tried to quit. We discussed this at length. She is willing to try Chantix and already has a script I gave her last time. She was referred r to pulmonary. She saw Dr Shelle Iron and was started on Spireva. PFT;s showed moderate obstruction, moderate decrease in DLCO and no bronchodilater response. She got a pneumo vax in the hospital but is hesitant to get a flu shot. She has not had any recurrent SSCP, palpitations, or edema. 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 She has not had a cough or sputum production. She also appears to have chronic pain syndrome. She does not work. CRF include elevated lipids, HTN and smoking. She has clinical Raynaud's disease with some upper extremity symptoms and blanching of the hands in cold weather. She needs a new script for nitro that we will call into [facility] [place] in [place]  Husband passed 01/2011 . Still smoking Cannot afford Chantix With depression Welbutrin would be good. Willing to try. Counseled on cessation for less than 10 minutes  Duplex 10/28/12  60-79% bilateral disease Stable  Needs f/u in November   Does not like Dr Shelle Iron  Would like to see diferent lung doctor  ROS: Denies fever, malais, weight loss, blurry vision, decreased visual acuity, cough, sputum, SOB, hemoptysis, pleuritic pain, palpitaitons, heartburn, abdominal pain, melena, lower extremity edema, claudication, or rash.  All other systems reviewed and negative  General: Affect appropriate Chronically ill female HEENT: normal Neck supple with no adenopathy JVP normal left  bruits no thyromegaly Lungs Bilateral wheezing and good diaphragmatic motion Heart:   S1/S2 systolic  murmur, no rub, gallop or click PMI normal Abdomen: benighn, BS positve, no tenderness, no AAA no bruit.  No HSM or HJR Distal pulses intact with no bruits No edema Neuro non-focal Skin warm and dry No muscular weakness   Current Outpatient Prescriptions  Medication Sig Dispense Refill  . albuterol (PROAIR HFA) 108 (90 BASE) MCG/ACT inhaler Inhale 2 puffs into the lungs every 6 (six) hours as needed.        Marland Kitchen aspirin 81 MG tablet Take 81 mg by mouth daily.      . carvedilol (COREG) 6.25 MG tablet Take 6.25 mg by mouth 2 (two) times daily with a meal.        . clopidogrel (PLAVIX) 75 MG tablet Take 75 mg by mouth daily.        . furosemide (LASIX) 40 MG tablet Take 40 mg by mouth daily.        . hydrocodone-acetaminophen (LORCET-HD) 5-500 MG per capsule Take 1 capsule by mouth every 4 (four) hours as needed.        Marland Kitchen lisinopril (PRINIVIL,ZESTRIL) 20 MG tablet Take 20 mg by mouth daily.        . Multiple Vitamin (MULTI-VITAMIN DAILY PO) Take by mouth.      . potassium chloride (KLOR-CON) 20 MEQ packet Take 20 mEq by mouth daily.        . simvastatin (ZOCOR) 80 MG tablet Take 80 mg by mouth at bedtime.        Marland Kitchen SPIRIVA HANDIHALER 18 MCG inhalation capsule INHALE  ONE DOSE BY MOUTH EVERY DAY  30 each  6  . SYMBICORT 160-4.5 MCG/ACT inhaler INHALE TWO PUFFS BY MOUTH TWICE DAILY  11 g  4  . [DISCONTINUED] buPROPion (WELLBUTRIN XL) 150 MG 24 hr tablet Take 1 tablet (150 mg total) by mouth daily.  30 tablet  3  . [DISCONTINUED] fluticasone (FLONASE) 50 MCG/ACT nasal spray Place 2 sprays into the nose daily.         No current facility-administered medications for this visit.    Allergies  Review of patient's allergies indicates no known allergies.  Electrocardiogram:  SR rate 68  PVC LVH    Assessment and Plan

## 2013-02-17 NOTE — Assessment & Plan Note (Signed)
Cholesterol is at goal.  Continue current dose of statin and diet Rx.  No myalgias or side effects.  F/U  LFT's in 6 months. Lab Results  Component Value Date   [facility]  Value: 124        Total Cholesterol/HDL:CHD Risk Coronary Heart Disease Risk Table                     Men   Women  1/2 Average Risk   3.4   3.3* 08/01/2007

## 2013-02-17 NOTE — Assessment & Plan Note (Signed)
Stable with no angina and good activity level.  Continue medical Rx  

## 2013-02-17 NOTE — Patient Instructions (Signed)
Your physician recommends that you schedule a follow-up appointment in: NEEDS APPT WITH DR  Delford Field OR   DR  SOOD  IN 1 MONTH Your physician wants you to follow-up in:   6 MONTHS WITH DR Haywood Filler will receive a reminder letter in the mail two months in advance. If you don't receive a letter, please call our office to schedule the follow-up appointment. Your physician recommends that you continue on your current medications as directed. Please refer to the Current Medication list given to you today.

## 2013-03-27 ENCOUNTER — Institutional Professional Consult (permissible substitution): Payer: Medicare Other | Admitting: Pulmonary Disease

## 2013-04-03 ENCOUNTER — Institutional Professional Consult (permissible substitution): Payer: Medicare Other | Admitting: Critical Care Medicine

## 2013-06-09 ENCOUNTER — Encounter: Payer: Self-pay | Admitting: Cardiology

## 2013-06-09 ENCOUNTER — Ambulatory Visit (HOSPITAL_COMMUNITY): Payer: Medicare Other | Attending: Cardiology

## 2013-06-09 DIAGNOSIS — I2510 Atherosclerotic heart disease of native coronary artery without angina pectoris: Secondary | ICD-10-CM | POA: Insufficient documentation

## 2013-06-09 DIAGNOSIS — I739 Peripheral vascular disease, unspecified: Secondary | ICD-10-CM

## 2013-06-09 DIAGNOSIS — J4489 Other specified chronic obstructive pulmonary disease: Secondary | ICD-10-CM | POA: Insufficient documentation

## 2013-06-09 DIAGNOSIS — F17200 Nicotine dependence, unspecified, uncomplicated: Secondary | ICD-10-CM | POA: Insufficient documentation

## 2013-06-09 DIAGNOSIS — I6529 Occlusion and stenosis of unspecified carotid artery: Secondary | ICD-10-CM | POA: Insufficient documentation

## 2013-06-09 DIAGNOSIS — I779 Disorder of arteries and arterioles, unspecified: Secondary | ICD-10-CM

## 2013-06-09 DIAGNOSIS — J449 Chronic obstructive pulmonary disease, unspecified: Secondary | ICD-10-CM | POA: Insufficient documentation

## 2013-06-09 DIAGNOSIS — G458 Other transient cerebral ischemic attacks and related syndromes: Secondary | ICD-10-CM

## 2013-06-09 DIAGNOSIS — I658 Occlusion and stenosis of other precerebral arteries: Secondary | ICD-10-CM | POA: Insufficient documentation

## 2013-06-09 DIAGNOSIS — I10 Essential (primary) hypertension: Secondary | ICD-10-CM | POA: Insufficient documentation

## 2013-06-09 DIAGNOSIS — E785 Hyperlipidemia, unspecified: Secondary | ICD-10-CM | POA: Insufficient documentation

## 2013-06-14 ENCOUNTER — Telehealth: Payer: Self-pay | Admitting: Pulmonary Disease

## 2013-06-14 NOTE — Telephone Encounter (Signed)
I am okay with switching.  Please schedule her for 30 minute visit when next available.

## 2013-06-14 NOTE — Telephone Encounter (Signed)
Ok with me 

## 2013-06-14 NOTE — Telephone Encounter (Signed)
Dr. Gwenette Greet please advise if you are okay with the switch? thanks

## 2013-06-14 NOTE — Telephone Encounter (Signed)
Please advise Dr. Sood thanks 

## 2013-06-15 NOTE — Telephone Encounter (Signed)
lmomtcb x1 

## 2013-06-15 NOTE — Telephone Encounter (Signed)
Spoke with pt and appt scheduled. Nothing further needed

## 2013-06-15 NOTE — Telephone Encounter (Signed)
Returning call can be reached at [phone].[name]

## 2013-07-21 ENCOUNTER — Institutional Professional Consult (permissible substitution): Payer: Medicare Other | Admitting: Pulmonary Disease

## 2013-08-04 ENCOUNTER — Institutional Professional Consult (permissible substitution): Payer: Medicare Other | Admitting: Pulmonary Disease

## 2013-09-01 ENCOUNTER — Other Ambulatory Visit: Payer: Self-pay

## 2013-09-01 ENCOUNTER — Ambulatory Visit (INDEPENDENT_AMBULATORY_CARE_PROVIDER_SITE_OTHER): Payer: Medicare Other | Admitting: Pulmonary Disease

## 2013-09-01 ENCOUNTER — Encounter: Payer: Self-pay | Admitting: Pulmonary Disease

## 2013-09-01 VITALS — BP 158/64 | HR 63 | Temp 97.3°F | Ht 66.5 in | Wt 172.6 lb

## 2013-09-01 DIAGNOSIS — F17200 Nicotine dependence, unspecified, uncomplicated: Secondary | ICD-10-CM

## 2013-09-01 DIAGNOSIS — J449 Chronic obstructive pulmonary disease, unspecified: Secondary | ICD-10-CM

## 2013-09-01 DIAGNOSIS — Z720 Tobacco use: Secondary | ICD-10-CM

## 2013-09-01 DIAGNOSIS — J4489 Other specified chronic obstructive pulmonary disease: Secondary | ICD-10-CM

## 2013-09-01 MED ORDER — ALBUTEROL SULFATE (2.5 MG/3ML) 0.083% IN NEBU: 2.5000 mg | INHALATION_SOLUTION | Freq: Four times a day (QID) | RESPIRATORY_TRACT | Status: DC | PRN

## 2013-09-01 MED ORDER — IPRATROPIUM BROMIDE 0.02 % IN SOLN: 0.5000 mg | Freq: Four times a day (QID) | RESPIRATORY_TRACT | Status: DC

## 2013-09-01 MED ORDER — IPRATROPIUM BROMIDE 0.02 % IN SOLN
0.5000 mg | Freq: Four times a day (QID) | RESPIRATORY_TRACT | Status: DC
Start: 2013-09-01 — End: 2013-09-01

## 2013-09-01 NOTE — Assessment & Plan Note (Signed)
She has history of COPD.  She continues to have symptoms.  Her main difficulty is continued tobacco abuse and inability afford medications.  Will try to get her a nebulizer machine and arrange for nebulized albuterol/ipratropium.  She will need repeat CXR and PFT at some point.

## 2013-09-01 NOTE — Assessment & Plan Note (Signed)
Discussed how continued smoking is main culprit with her respiratory problems.  Reviewed several options to help with smoking cessation.  She is concerned about expense of any therapies, and feels she needs cigarettes to help with her nerves.

## 2013-09-01 NOTE — Progress Notes (Deleted)
   Subjective:    Patient ID: [name], female    DOB: 1945-07-11, 68 y.o.   MRN: [id number]  HPI    Review of Systems  Constitutional: Negative for fever and unexpected weight change.  HENT: Positive for congestion, postnasal drip, rhinorrhea, sinus pressure, sneezing and trouble swallowing. Negative for dental problem, ear pain, nosebleeds and sore throat.   Eyes: Negative for redness and itching.  Respiratory: Positive for cough and shortness of breath. Negative for chest tightness and wheezing.   Cardiovascular: Negative for palpitations and leg swelling.  Gastrointestinal: Negative for nausea and vomiting.  Genitourinary: Negative for dysuria.  Musculoskeletal: Negative for joint swelling.  Skin: Negative for rash.  Neurological: Negative for headaches.  Hematological: Does not bruise/bleed easily.  Psychiatric/Behavioral: Negative for dysphoric mood. The patient is not nervous/anxious.        Objective:   Physical Exam        Assessment & Plan:
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
## 2013-09-01 NOTE — Addendum Note (Signed)
Addended by: Maurice March on: 09/01/2013 05:46 PM   Modules accepted: Orders

## 2013-09-01 NOTE — Addendum Note (Signed)
Addended by: Maurice March on: 09/01/2013 05:54 PM   Modules accepted: Orders

## 2013-09-01 NOTE — Patient Instructions (Signed)
Will arrange for home nebulizer machine Albuterol and ipratropium one vial nebulized up to four times per day as needed for cough, wheeze, or chest congestion Follow up in 6 to 8 weeks

## 2013-09-04 ENCOUNTER — Telehealth: Payer: Self-pay | Admitting: Pulmonary Disease

## 2013-09-04 NOTE — Telephone Encounter (Signed)
Per the pt's insurance, they will not cover any prescription with "as needed" in the sig. Verbal order was given to [facility] to change the sig to every 6 hours for wheezing/SOB. Nothing further was needed.

## 2013-09-26 ENCOUNTER — Telehealth: Payer: Self-pay | Admitting: Pulmonary Disease

## 2013-09-26 NOTE — Telephone Encounter (Signed)
Spoke with the pt and notified of recs per VS  She verbalized understanding  Nothing further needed 

## 2013-09-26 NOTE — Telephone Encounter (Signed)
Okay to continue with prn albuterol.  She should call back if her breathing gets worse.

## 2013-09-26 NOTE — Telephone Encounter (Signed)
Called and spoke with pt and she stated that she has been using the albuterol and the ipratropium in the nebulizer.  She stated that the ipratropium was causing her to have chest pain, cough, she was having increased SOB and was having a hard time walking without SOB.   She stated that she has been off the ipratropium x 4 days and she is much better.  She is still using the albuterol but wanted to make VS aware.  Any further recs for the pt?  VS please advise. thanks

## 2013-09-26 NOTE — Telephone Encounter (Signed)
Pt having bad reaction to medication pls call asap [phone].[name]

## 2013-10-13 ENCOUNTER — Ambulatory Visit: Payer: Medicare Other | Admitting: Pulmonary Disease

## 2013-10-27 ENCOUNTER — Ambulatory Visit (INDEPENDENT_AMBULATORY_CARE_PROVIDER_SITE_OTHER): Payer: Medicare Other | Admitting: Cardiovascular Disease

## 2013-10-27 ENCOUNTER — Encounter: Payer: Self-pay | Admitting: Cardiovascular Disease

## 2013-10-27 VITALS — BP 123/50 | HR 60 | Ht 66.5 in | Wt 169.0 lb

## 2013-10-27 DIAGNOSIS — J449 Chronic obstructive pulmonary disease, unspecified: Secondary | ICD-10-CM

## 2013-10-27 DIAGNOSIS — I6529 Occlusion and stenosis of unspecified carotid artery: Secondary | ICD-10-CM

## 2013-10-27 DIAGNOSIS — M79606 Pain in leg, unspecified: Secondary | ICD-10-CM

## 2013-10-27 DIAGNOSIS — M79609 Pain in unspecified limb: Secondary | ICD-10-CM

## 2013-10-27 DIAGNOSIS — I2510 Atherosclerotic heart disease of native coronary artery without angina pectoris: Secondary | ICD-10-CM

## 2013-10-27 DIAGNOSIS — I10 Essential (primary) hypertension: Secondary | ICD-10-CM

## 2013-10-27 DIAGNOSIS — R0989 Other specified symptoms and signs involving the circulatory and respiratory systems: Secondary | ICD-10-CM

## 2013-10-27 DIAGNOSIS — E785 Hyperlipidemia, unspecified: Secondary | ICD-10-CM

## 2013-10-27 NOTE — Assessment & Plan Note (Signed)
Cholesterol is at goal.  Continue current dose of statin and diet Rx.  No myalgias or side effects.  F/U  LFT's in 6 months. Lab Results  Component Value Date   [facility]  Value: 124        Total Cholesterol/HDL:CHD Risk Coronary Heart Disease Risk Table                     Men   Women  1/2 Average Risk   3.4   3.3* 08/01/2007   Labs with primary patient indicates LDL under 130

## 2013-10-27 NOTE — Assessment & Plan Note (Signed)
Well controlled.  Continue current medications and low sodium Dash type diet.    

## 2013-10-27 NOTE — Progress Notes (Signed)
Patient ID: [name], female   DOB: 23-Mar-1946, 68 y.o.   MRN: [id number] [name] is a previous patient of Dr Acie Fredrickson. She had a BMS placed to the RCA in February 2010. I could not find a cath report in echart. She subsequently had a left CEA by Dr. Oneida Alar in September 2010. 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 she has not tried to quit. We discussed this at length. She is willing to try Chantix and already has a script I gave her last time. She was referred r to pulmonary. She saw Dr Gwenette Greet and was started on Spireva. PFT;s showed moderate obstruction, moderate decrease in DLCO and no bronchodilater response. She got a pneumo vax in the hospital but is hesitant to get a flu shot. She has not had any recurrent SSCP, palpitations, or edema. 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 She has not had a cough or sputum production. She also appears to have chronic pain syndrome. She does not work. CRF include elevated lipids, HTN and smoking. She has clinical Raynaud's disease with some upper extremity symptoms and blanching of the hands in cold weather. She needs a new script for nitro that we will call into [facility] in [place]   Husband passed 01/2011 . Still smoking Cannot afford Chantix With depression Welbutrin would be good. Willing to try. Counseled on cessation for less than 10 minutes   Last duplex 1`/2/15 reviewed 60-79% RICA stenosis       ROS: Denies fever, malais, weight loss, blurry vision, decreased visual acuity, cough, sputum, SOB, hemoptysis, pleuritic pain, palpitaitons, heartburn, abdominal pain, melena, lower extremity edema, claudication, or rash.  All other systems reviewed and negative  General: Affect appropriate Healthy:  appears stated age 3: normal Neck supple with no adenopathy JVP normal bilateral bruits no thyromegaly Lungs clear with no wheezing and good diaphragmatic motion Heart:  S1/S2 no murmur, no rub, gallop or click PMI normal Abdomen:  benighn, BS positve, no tenderness, no AAA no bruit.  No HSM or HJR Distal pulses hard to palpate  No edema Neuro non-focal Skin warm and dry No muscular weakness   Current Outpatient Prescriptions  Medication Sig Dispense Refill  . albuterol (PROVENTIL) (2.5 MG/3ML) 0.083% nebulizer solution Take 3 mLs (2.5 mg total) by nebulization every 6 (six) hours as needed for wheezing or shortness of breath.  28 vial  0  . aspirin 81 MG tablet Take 81 mg by mouth daily.      . carvedilol (COREG) 6.25 MG tablet Take 6.25 mg by mouth 2 (two) times daily with a meal.        . clopidogrel (PLAVIX) 75 MG tablet Take 75 mg by mouth daily.        . furosemide (LASIX) 40 MG tablet Take 40 mg by mouth daily.        . hydrocodone-acetaminophen (LORCET-HD) 5-500 MG per capsule Take 1 capsule by mouth every 4 (four) hours as needed.        Marland Kitchen ipratropium (ATROVENT) 0.02 % nebulizer solution Take 2.5 mLs (0.5 mg total) by nebulization 4 (four) times daily.  100 mL  0  . lisinopril (PRINIVIL,ZESTRIL) 20 MG tablet Take 20 mg by mouth daily.        . Multiple Vitamin (MULTI-VITAMIN DAILY PO) Take 1 capsule by mouth daily.       . Omega-3 Fatty Acids (FISH OIL) 1000 MG CAPS Take 2 capsules by mouth.      . potassium chloride (KLOR-CON) 20  MEQ packet Take 20 mEq by mouth daily.        . simvastatin (ZOCOR) 80 MG tablet Take 80 mg by mouth at bedtime.        . [DISCONTINUED] buPROPion (WELLBUTRIN XL) 150 MG 24 hr tablet Take 1 tablet (150 mg total) by mouth daily.  30 tablet  3  . [DISCONTINUED] fluticasone (FLONASE) 50 MCG/ACT nasal spray Place 2 sprays into the nose daily.         No current facility-administered medications for this visit.    Allergies  Review of patient's allergies indicates no known allergies.  Electrocardiogram:  SR rate 65 LVH   Assessment and Plan

## 2013-10-27 NOTE — Assessment & Plan Note (Signed)
F/U Dr Halford Chessman  Counseled on smoking cessation She is proud of being down to less than a ppd compared to 3ppd in past

## 2013-10-27 NOTE — Assessment & Plan Note (Signed)
Stable moderate disease but still smoking F/U duplex in July

## 2013-10-27 NOTE — Assessment & Plan Note (Signed)
Stable with no angina and good activity level.  Continue medical Rx  

## 2013-10-27 NOTE — Patient Instructions (Signed)
Your physician recommends that you schedule a follow-up appointment in: July WITH  DR   Cypress Quarters  Your physician recommends that you continue on your current medications as directed. Please refer to the Current Medication list given to you today.  Your physician has requested that you have a carotid duplex. This test is an ultrasound of the carotid arteries in your neck. It looks at blood flow through these arteries that supply the brain with blood. Allow one hour for this exam. There are no restrictions or special instructions.  Your physician has requested that you have an ankle brachial index (ABI). During this test an ultrasound and blood pressure cuff are used to evaluate the arteries that supply the arms and legs with blood. Allow thirty minutes for this exam. There are no restrictions or special instructions.

## 2013-12-15 ENCOUNTER — Ambulatory Visit: Payer: Medicare Other | Admitting: Pulmonary Disease

## 2014-01-09 ENCOUNTER — Other Ambulatory Visit (HOSPITAL_COMMUNITY): Payer: Self-pay | Admitting: *Deleted

## 2014-01-09 DIAGNOSIS — R0989 Other specified symptoms and signs involving the circulatory and respiratory systems: Secondary | ICD-10-CM

## 2014-01-10 ENCOUNTER — Ambulatory Visit: Payer: Medicare Other | Admitting: Cardiovascular Disease

## 2014-01-10 ENCOUNTER — Encounter (HOSPITAL_COMMUNITY): Payer: Medicare Other

## 2014-02-23 ENCOUNTER — Encounter: Payer: Self-pay | Admitting: Pulmonary Disease

## 2014-02-23 ENCOUNTER — Ambulatory Visit (INDEPENDENT_AMBULATORY_CARE_PROVIDER_SITE_OTHER): Payer: Medicare Other | Admitting: Pulmonary Disease

## 2014-02-23 VITALS — BP 110/62 | HR 67 | Temp 98.9°F | Ht 66.5 in | Wt 164.8 lb

## 2014-02-23 DIAGNOSIS — Z23 Encounter for immunization: Secondary | ICD-10-CM

## 2014-02-23 DIAGNOSIS — J4489 Other specified chronic obstructive pulmonary disease: Secondary | ICD-10-CM

## 2014-02-23 DIAGNOSIS — F17200 Nicotine dependence, unspecified, uncomplicated: Secondary | ICD-10-CM

## 2014-02-23 DIAGNOSIS — I2510 Atherosclerotic heart disease of native coronary artery without angina pectoris: Secondary | ICD-10-CM

## 2014-02-23 DIAGNOSIS — Z720 Tobacco use: Secondary | ICD-10-CM

## 2014-02-23 DIAGNOSIS — J449 Chronic obstructive pulmonary disease, unspecified: Secondary | ICD-10-CM

## 2014-02-23 NOTE — Assessment & Plan Note (Signed)
Encouraged her to continue with her smoking cessation efforts.

## 2014-02-23 NOTE — Assessment & Plan Note (Signed)
She was not able to tolerate ipratropium.  She is to continue albuterol prn.  Finances continue to be an issue with her therapy.  She would like to defer doing CXR and PFT for now.  She will get flu shot today.

## 2014-02-23 NOTE — Patient Instructions (Signed)
Flu shot today Follow up in 1 year 

## 2014-02-23 NOTE — Progress Notes (Signed)
Chief Complaint  Patient presents with  . Follow-up    Pt reports no change since last OV. Pt states that she is not taking Atrovent neb- states that it made her breathing worse.     History of Present Illness: MIKAEL DEBELL is a 68 y.o. female smoker with COPD.  She is down to less than a pack of cigarettes per day.  She is not having as much cough, or wheeze.  She brings up clear sputum.  She uses albuterol bid.  She got worse with ipratropium.  She has lots of expenses from home, her car, and her medicines.  She can't afford any more expenses.  Tests: PFT 06/26/09 >> FEV1 1.88 (78%), FEV1% 61, TLC 6.60 (121%), DLCO 59% Echo 05/02/10 >> EF 85%, grade 1 diastolic dysfx, mild MR, PAS 22 mmHg  PMHx, PSHx, Medications, Allergies, Fhx, Shx reviewed.  Physical Exam:  General - No distress ENT - No sinus tenderness, no oral exudate, edentuolous Cardiac - s1s2 regular, no murmur, pulses symmetric Chest - prolonged exhalation, no wheeze Back - No focal tenderness Abd - Soft, non-tender Ext - No edema Neuro - Normal strength Skin - No rashes Psych - Normal mood, and behavior   Assessment/Plan:  Chesley Mires, MD [place] Pulmonary/Critical Care/Sleep Pager:  [phone]

## 2014-03-15 ENCOUNTER — Other Ambulatory Visit (HOSPITAL_COMMUNITY): Payer: Self-pay | Admitting: *Deleted

## 2014-03-15 DIAGNOSIS — R0989 Other specified symptoms and signs involving the circulatory and respiratory systems: Secondary | ICD-10-CM

## 2014-04-06 ENCOUNTER — Encounter (HOSPITAL_COMMUNITY): Payer: Medicare Other

## 2014-04-06 ENCOUNTER — Ambulatory Visit (HOSPITAL_COMMUNITY): Payer: Medicare Other | Attending: Cardiovascular Disease | Admitting: *Deleted

## 2014-04-06 DIAGNOSIS — I2510 Atherosclerotic heart disease of native coronary artery without angina pectoris: Secondary | ICD-10-CM | POA: Insufficient documentation

## 2014-04-06 DIAGNOSIS — J449 Chronic obstructive pulmonary disease, unspecified: Secondary | ICD-10-CM | POA: Insufficient documentation

## 2014-04-06 DIAGNOSIS — Z720 Tobacco use: Secondary | ICD-10-CM | POA: Diagnosis not present

## 2014-04-06 DIAGNOSIS — I10 Essential (primary) hypertension: Secondary | ICD-10-CM | POA: Diagnosis not present

## 2014-04-06 DIAGNOSIS — I6523 Occlusion and stenosis of bilateral carotid arteries: Secondary | ICD-10-CM

## 2014-04-06 DIAGNOSIS — E785 Hyperlipidemia, unspecified: Secondary | ICD-10-CM | POA: Diagnosis not present

## 2014-04-06 DIAGNOSIS — R0989 Other specified symptoms and signs involving the circulatory and respiratory systems: Secondary | ICD-10-CM | POA: Insufficient documentation

## 2014-04-06 NOTE — Progress Notes (Signed)
Carotid Duplex

## 2014-04-20 ENCOUNTER — Encounter: Payer: Self-pay | Admitting: Cardiovascular Disease

## 2014-04-20 ENCOUNTER — Ambulatory Visit (INDEPENDENT_AMBULATORY_CARE_PROVIDER_SITE_OTHER): Payer: Medicare Other | Admitting: Cardiovascular Disease

## 2014-04-20 ENCOUNTER — Ambulatory Visit: Payer: Medicare Other | Admitting: Cardiovascular Disease

## 2014-04-20 VITALS — BP 128/52 | HR 58 | Ht 66.5 in | Wt 160.8 lb

## 2014-04-20 DIAGNOSIS — R0602 Shortness of breath: Secondary | ICD-10-CM

## 2014-04-20 DIAGNOSIS — I10 Essential (primary) hypertension: Secondary | ICD-10-CM

## 2014-04-20 DIAGNOSIS — E785 Hyperlipidemia, unspecified: Secondary | ICD-10-CM

## 2014-04-20 DIAGNOSIS — I2510 Atherosclerotic heart disease of native coronary artery without angina pectoris: Secondary | ICD-10-CM

## 2014-04-20 DIAGNOSIS — J449 Chronic obstructive pulmonary disease, unspecified: Secondary | ICD-10-CM

## 2014-04-20 DIAGNOSIS — R0989 Other specified symptoms and signs involving the circulatory and respiratory systems: Secondary | ICD-10-CM

## 2014-04-20 NOTE — Assessment & Plan Note (Signed)
Stable with no angina and good activity level.  Continue medical Rx  

## 2014-04-20 NOTE — Assessment & Plan Note (Signed)
Well controlled.  Continue current medications and low sodium Dash type diet.    

## 2014-04-20 NOTE — Assessment & Plan Note (Signed)
F/U Sood  CXR ordered She may not keep appt keeps talking about no money and car trouble

## 2014-04-20 NOTE — Assessment & Plan Note (Signed)
Left ICA 60-79% stenosis.  No TIA symptoms.  Continue antiplatelet Rx and F/U carotid duplex in 6 months  60-79% RICA stenosis.  F/U carotid duplex in 6 months  ASA needs to stop smoking

## 2014-04-20 NOTE — Patient Instructions (Addendum)
Your physician wants you to follow-up in:   [name] will receive a reminder letter in the mail two months in advance. If you don't receive a letter, please call our office to schedule the follow-up appointment. Your physician recommends that you continue on your current medications as directed. Please refer to the Current Medication list given to you today. A chest x-ray takes a picture of the organs and structures inside the chest, including the heart, lungs, and blood vessels. This test can show several things, including, whether the heart is enlarges; whether fluid is building up in the lungs; and whether pacemaker / defibrillator leads are still in place.  HAVE  DONE   04/30/14   [phone]

## 2014-04-20 NOTE — Assessment & Plan Note (Signed)
Cholesterol is at goal.  Continue current dose of statin and diet Rx.  No myalgias or side effects.  F/U  LFT's in 6 months. Lab Results  Component Value Date   LDLCALC * 08/01/2007    124        Total Cholesterol/HDL:CHD Risk Coronary Heart Disease Risk Table                     Men   Women  1/2 Average Risk   3.4   3.3  Labs with NP in [place]

## 2014-04-20 NOTE — Progress Notes (Signed)
Patient ID: [name], female   DOB: Dec 05, 1945, 68 y.o.   MRN: [id number] [name] is a previous patient of Dr [name]. She had a BMS placed to the RCA in February 2010. I could not find a cath report in e chart. She subsequently had a left CEA by Dr. Oneida Mitchell in September 2010. 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 she has not tried to quit. We discussed this at length.  She was referred r to pulmonary. She saw Dr [name] and was started on Spireva. PFT;s showed moderate obstruction, moderate decrease in DLCO and no bronchodilater response.  She has not had any recurrent SSCP, palpitations, or edema. 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 She has not had a cough or sputum production. She also appears to have chronic pain syndrome. She does not work. CRF include elevated lipids, HTN and smoking. She has clinical Raynaud's disease with some upper extremity symptoms and blanching of the hands in cold weather.        Last duplex 04/09/14 reviewed 60-79%  L/RICA stenosis   She has poor medical f/u due to finances and car trouble  Sees NP in [place] and Dr [name] for lungs No CXR done this year.  Needs a boney lesion on upper sternum removed       ROS: Denies fever, malais, weight loss, blurry vision, decreased visual acuity, cough, sputum, SOB, hemoptysis, pleuritic pain, palpitaitons, heartburn, abdominal pain, melena, lower extremity edema, claudication, or rash.  All other systems reviewed and negative  General: Affect appropriate Chronically ill femal  HEENT: normal Neck supple with no adenopathy JVP normal bilateral  bruits no thyromegaly Lungs clear with no wheezing and good diaphragmatic motion Heart:  S1/S2 no murmur, no rub, gallop or click  boney carbuncle upper sternum PMI normal Abdomen: benighn, BS positve, no tenderness, no AAA Bilateral femoral  bruit.  No HSM or HJR Distal pulses intact with no bruits No edema Neuro non-focal Skin warm and dry No muscular  weakness   Current Outpatient Prescriptions  Medication Sig Dispense Refill  . albuterol (PROVENTIL) (2.5 MG/3ML) 0.083% nebulizer solution Take 3 mLs (2.5 mg total) by nebulization every 6 (six) hours as needed for wheezing or shortness of breath. 28 vial 0  . aspirin 81 MG tablet Take 81 mg by mouth daily.    . carvedilol (COREG) 6.25 MG tablet Take 6.25 mg by mouth 2 (two) times daily with a meal.      . clopidogrel (PLAVIX) 75 MG tablet Take 75 mg by mouth daily.      . furosemide (LASIX) 40 MG tablet Take 40 mg by mouth daily.      . hydrocodone-acetaminophen (LORCET-HD) 5-500 MG per capsule Take 1 capsule by mouth every 4 (four) hours as needed.      Marland Kitchen lisinopril (PRINIVIL,ZESTRIL) 20 MG tablet Take 20 mg by mouth daily.      . potassium chloride (KLOR-CON) 20 MEQ packet Take 20 mEq by mouth daily.      . simvastatin (ZOCOR) 80 MG tablet Take 80 mg by mouth at bedtime.      . [DISCONTINUED] buPROPion (WELLBUTRIN XL) 150 MG 24 hr tablet Take 1 tablet (150 mg total) by mouth daily. 30 tablet 3  . [DISCONTINUED] fluticasone (FLONASE) 50 MCG/ACT nasal spray Place 2 sprays into the nose daily.       No current facility-administered medications for this visit.    Allergies  Review of patient's allergies indicates no known allergies.  Electrocardiogram:  02/17/13  SR LVH PVC;s   Today SR rate 58  LVH strain inferolateral T wave changes   Assessment and Plan

## 2014-04-25 ENCOUNTER — Telehealth: Payer: Self-pay | Admitting: Cardiovascular Disease

## 2014-04-25 NOTE — Telephone Encounter (Signed)
New message           Pt would like to know where she needs to go to get a chest xray on 11/23

## 2014-04-25 NOTE — Telephone Encounter (Signed)
Spoke with pt and her daughter and told them X-ray is to be done at [facility] on [address].  Pt is planning to go for X-ray on May 01, 2014

## 2014-05-01 ENCOUNTER — Ambulatory Visit (INDEPENDENT_AMBULATORY_CARE_PROVIDER_SITE_OTHER)
Admission: RE | Admit: 2014-05-01 | Discharge: 2014-05-01 | Disposition: A | Discharge status: Home / Self Care | Payer: Medicare Other | Source: Ambulatory Visit | Attending: Cardiovascular Disease | Admitting: Cardiovascular Disease

## 2014-05-01 DIAGNOSIS — R0602 Shortness of breath: Secondary | ICD-10-CM

## 2014-05-07 ENCOUNTER — Telehealth: Payer: Self-pay | Admitting: Cardiovascular Disease

## 2014-05-07 DIAGNOSIS — L989 Disorder of the skin and subcutaneous tissue, unspecified: Secondary | ICD-10-CM

## 2014-05-07 NOTE — Telephone Encounter (Signed)
Suspect plastic surgeon like Stephanie Coup would be best

## 2014-05-07 NOTE — Telephone Encounter (Signed)
New message  ° ° °Patient calling back to speak with nurse  °

## 2014-05-07 NOTE — Telephone Encounter (Signed)
PT  AWARE  OF  CXR RESULTS  WHILE  GIVING RESULTS  ASKED  IF  DR  Johnsie Cancel  COULD  RECOMMEND  MD   FOR  REMOVAL OF  BONY LESION TO  UPPER  STERNUM.

## 2014-05-09 NOTE — Telephone Encounter (Signed)
PT AWARE DR BARBER'S NUMBER  GIVEN TO  PT  TO MAKE   AN APPT./CY

## 2014-06-18 ENCOUNTER — Ambulatory Visit: Payer: Medicare Other | Admitting: Cardiovascular Disease

## 2014-10-17 ENCOUNTER — Telehealth: Payer: Self-pay | Admitting: *Deleted

## 2014-10-17 NOTE — Telephone Encounter (Signed)
NOTED ./CY 

## 2014-10-17 NOTE — Telephone Encounter (Signed)
-----   Message from Philbert Riser sent at 04/03/2014  4:19 PM EDT ----- Regarding: LOWER ART [place] Held   Cancel Rsn: Patient (Pt called to cancel Arterial Lower//Only wanted to have 1 test completed//sr

## 2019-10-05 ENCOUNTER — Encounter (HOSPITAL_COMMUNITY): Payer: Self-pay | Admitting: Emergency Medicine

## 2019-10-05 ENCOUNTER — Other Ambulatory Visit: Payer: Self-pay

## 2019-10-05 ENCOUNTER — Inpatient Hospital Stay (HOSPITAL_COMMUNITY)
Admission: EM | Admit: 2019-10-05 | Discharge: 2019-10-10 | DRG: 286 | Disposition: A | Discharge status: Home / Self Care | Payer: Medicare Other | Attending: Internal Medicine | Admitting: Internal Medicine

## 2019-10-05 DIAGNOSIS — I5021 Acute systolic (congestive) heart failure: Secondary | ICD-10-CM | POA: Diagnosis present

## 2019-10-05 DIAGNOSIS — Z955 Presence of coronary angioplasty implant and graft: Secondary | ICD-10-CM

## 2019-10-05 DIAGNOSIS — Z888 Allergy status to other drugs, medicaments and biological substances status: Secondary | ICD-10-CM

## 2019-10-05 DIAGNOSIS — Z9012 Acquired absence of left breast and nipple: Secondary | ICD-10-CM

## 2019-10-05 DIAGNOSIS — Z95820 Peripheral vascular angioplasty status with implants and grafts: Secondary | ICD-10-CM

## 2019-10-05 DIAGNOSIS — Z801 Family history of malignant neoplasm of trachea, bronchus and lung: Secondary | ICD-10-CM

## 2019-10-05 DIAGNOSIS — R0989 Other specified symptoms and signs involving the circulatory and respiratory systems: Secondary | ICD-10-CM | POA: Diagnosis present

## 2019-10-05 DIAGNOSIS — I272 Pulmonary hypertension, unspecified: Secondary | ICD-10-CM | POA: Diagnosis present

## 2019-10-05 DIAGNOSIS — Z7902 Long term (current) use of antithrombotics/antiplatelets: Secondary | ICD-10-CM

## 2019-10-05 DIAGNOSIS — Z853 Personal history of malignant neoplasm of breast: Secondary | ICD-10-CM

## 2019-10-05 DIAGNOSIS — J441 Chronic obstructive pulmonary disease with (acute) exacerbation: Secondary | ICD-10-CM | POA: Diagnosis present

## 2019-10-05 DIAGNOSIS — E785 Hyperlipidemia, unspecified: Secondary | ICD-10-CM | POA: Diagnosis present

## 2019-10-05 DIAGNOSIS — I2541 Coronary artery aneurysm: Secondary | ICD-10-CM | POA: Diagnosis present

## 2019-10-05 DIAGNOSIS — Z79899 Other long term (current) drug therapy: Secondary | ICD-10-CM

## 2019-10-05 DIAGNOSIS — Z823 Family history of stroke: Secondary | ICD-10-CM

## 2019-10-05 DIAGNOSIS — F17210 Nicotine dependence, cigarettes, uncomplicated: Secondary | ICD-10-CM | POA: Diagnosis present

## 2019-10-05 DIAGNOSIS — I5032 Chronic diastolic (congestive) heart failure: Secondary | ICD-10-CM | POA: Diagnosis present

## 2019-10-05 DIAGNOSIS — Z833 Family history of diabetes mellitus: Secondary | ICD-10-CM

## 2019-10-05 DIAGNOSIS — I10 Essential (primary) hypertension: Secondary | ICD-10-CM

## 2019-10-05 DIAGNOSIS — K219 Gastro-esophageal reflux disease without esophagitis: Secondary | ICD-10-CM | POA: Diagnosis present

## 2019-10-05 DIAGNOSIS — Z720 Tobacco use: Secondary | ICD-10-CM | POA: Diagnosis present

## 2019-10-05 DIAGNOSIS — I2510 Atherosclerotic heart disease of native coronary artery without angina pectoris: Secondary | ICD-10-CM | POA: Diagnosis present

## 2019-10-05 DIAGNOSIS — I447 Left bundle-branch block, unspecified: Secondary | ICD-10-CM | POA: Diagnosis present

## 2019-10-05 DIAGNOSIS — Z20822 Contact with and (suspected) exposure to covid-19: Secondary | ICD-10-CM | POA: Diagnosis present

## 2019-10-05 DIAGNOSIS — E876 Hypokalemia: Secondary | ICD-10-CM | POA: Diagnosis not present

## 2019-10-05 DIAGNOSIS — Z8249 Family history of ischemic heart disease and other diseases of the circulatory system: Secondary | ICD-10-CM

## 2019-10-05 DIAGNOSIS — J449 Chronic obstructive pulmonary disease, unspecified: Secondary | ICD-10-CM | POA: Diagnosis present

## 2019-10-05 DIAGNOSIS — Z7982 Long term (current) use of aspirin: Secondary | ICD-10-CM

## 2019-10-05 DIAGNOSIS — I428 Other cardiomyopathies: Secondary | ICD-10-CM | POA: Diagnosis present

## 2019-10-05 DIAGNOSIS — I110 Hypertensive heart disease with heart failure: Principal | ICD-10-CM | POA: Diagnosis present

## 2019-10-05 NOTE — ED Triage Notes (Signed)
[facility] - pt c/o SOB x 3 months. Pt's O2 96% on RA

## 2019-10-06 ENCOUNTER — Emergency Department (HOSPITAL_COMMUNITY): Payer: Medicare Other

## 2019-10-06 ENCOUNTER — Encounter (HOSPITAL_COMMUNITY): Payer: Self-pay | Admitting: Family Medicine

## 2019-10-06 ENCOUNTER — Observation Stay (HOSPITAL_COMMUNITY): Payer: Medicare Other

## 2019-10-06 ENCOUNTER — Other Ambulatory Visit: Payer: Self-pay

## 2019-10-06 DIAGNOSIS — Z801 Family history of malignant neoplasm of trachea, bronchus and lung: Secondary | ICD-10-CM | POA: Diagnosis not present

## 2019-10-06 DIAGNOSIS — Z955 Presence of coronary angioplasty implant and graft: Secondary | ICD-10-CM | POA: Diagnosis not present

## 2019-10-06 DIAGNOSIS — K219 Gastro-esophageal reflux disease without esophagitis: Secondary | ICD-10-CM | POA: Diagnosis present

## 2019-10-06 DIAGNOSIS — Z833 Family history of diabetes mellitus: Secondary | ICD-10-CM | POA: Diagnosis not present

## 2019-10-06 DIAGNOSIS — J441 Chronic obstructive pulmonary disease with (acute) exacerbation: Secondary | ICD-10-CM | POA: Diagnosis present

## 2019-10-06 DIAGNOSIS — R0989 Other specified symptoms and signs involving the circulatory and respiratory systems: Secondary | ICD-10-CM | POA: Diagnosis not present

## 2019-10-06 DIAGNOSIS — Z8249 Family history of ischemic heart disease and other diseases of the circulatory system: Secondary | ICD-10-CM | POA: Diagnosis not present

## 2019-10-06 DIAGNOSIS — I272 Pulmonary hypertension, unspecified: Secondary | ICD-10-CM | POA: Diagnosis present

## 2019-10-06 DIAGNOSIS — Z20822 Contact with and (suspected) exposure to covid-19: Secondary | ICD-10-CM | POA: Diagnosis present

## 2019-10-06 DIAGNOSIS — E876 Hypokalemia: Secondary | ICD-10-CM | POA: Diagnosis not present

## 2019-10-06 DIAGNOSIS — E782 Mixed hyperlipidemia: Secondary | ICD-10-CM | POA: Diagnosis not present

## 2019-10-06 DIAGNOSIS — I428 Other cardiomyopathies: Secondary | ICD-10-CM | POA: Diagnosis present

## 2019-10-06 DIAGNOSIS — I5032 Chronic diastolic (congestive) heart failure: Secondary | ICD-10-CM | POA: Diagnosis not present

## 2019-10-06 DIAGNOSIS — Z7902 Long term (current) use of antithrombotics/antiplatelets: Secondary | ICD-10-CM | POA: Diagnosis not present

## 2019-10-06 DIAGNOSIS — Z823 Family history of stroke: Secondary | ICD-10-CM | POA: Diagnosis not present

## 2019-10-06 DIAGNOSIS — I2541 Coronary artery aneurysm: Secondary | ICD-10-CM | POA: Diagnosis present

## 2019-10-06 DIAGNOSIS — I5021 Acute systolic (congestive) heart failure: Secondary | ICD-10-CM

## 2019-10-06 DIAGNOSIS — I447 Left bundle-branch block, unspecified: Secondary | ICD-10-CM | POA: Diagnosis present

## 2019-10-06 DIAGNOSIS — Z79899 Other long term (current) drug therapy: Secondary | ICD-10-CM | POA: Diagnosis not present

## 2019-10-06 DIAGNOSIS — Z95820 Peripheral vascular angioplasty status with implants and grafts: Secondary | ICD-10-CM | POA: Diagnosis not present

## 2019-10-06 DIAGNOSIS — F17210 Nicotine dependence, cigarettes, uncomplicated: Secondary | ICD-10-CM | POA: Diagnosis present

## 2019-10-06 DIAGNOSIS — Z720 Tobacco use: Secondary | ICD-10-CM | POA: Diagnosis not present

## 2019-10-06 DIAGNOSIS — I2510 Atherosclerotic heart disease of native coronary artery without angina pectoris: Secondary | ICD-10-CM | POA: Diagnosis present

## 2019-10-06 DIAGNOSIS — J449 Chronic obstructive pulmonary disease, unspecified: Secondary | ICD-10-CM | POA: Diagnosis present

## 2019-10-06 DIAGNOSIS — Z888 Allergy status to other drugs, medicaments and biological substances status: Secondary | ICD-10-CM | POA: Diagnosis not present

## 2019-10-06 DIAGNOSIS — I10 Essential (primary) hypertension: Secondary | ICD-10-CM | POA: Diagnosis not present

## 2019-10-06 DIAGNOSIS — Z9012 Acquired absence of left breast and nipple: Secondary | ICD-10-CM | POA: Diagnosis not present

## 2019-10-06 DIAGNOSIS — I110 Hypertensive heart disease with heart failure: Secondary | ICD-10-CM | POA: Diagnosis present

## 2019-10-06 DIAGNOSIS — E785 Hyperlipidemia, unspecified: Secondary | ICD-10-CM | POA: Diagnosis present

## 2019-10-06 DIAGNOSIS — Z853 Personal history of malignant neoplasm of breast: Secondary | ICD-10-CM | POA: Diagnosis not present

## 2019-10-06 LAB — COMPREHENSIVE METABOLIC PANEL
ALT: 10 U/L (ref 0–44)
AST: 16 U/L (ref 15–41)
Albumin: 3.6 g/dL (ref 3.5–5.0)
Alkaline Phosphatase: 56 U/L (ref 38–126)
Anion gap: 10 (ref 5–15)
BUN: 9 mg/dL (ref 8–23)
CO2: 26 mmol/L (ref 22–32)
Calcium: 8.5 mg/dL — ABNORMAL LOW (ref 8.9–10.3)
Chloride: 99 mmol/L (ref 98–111)
Creatinine, Ser: 0.89 mg/dL (ref 0.44–1.00)
GFR calc Af Amer: >60 mL/min (ref >60)
GFR calc non Af Amer: >60 mL/min (ref >60)
Glucose, Bld: 101 mg/dL — ABNORMAL HIGH (ref 70–99)
Potassium: 3.7 mmol/L (ref 3.5–5.1)
Sodium: 135 mmol/L (ref 135–145)
Total Bilirubin: 0.6 mg/dL (ref 0.3–1.2)
Total Protein: 6.8 g/dL (ref 6.5–8.1)

## 2019-10-06 LAB — CBC WITH DIFFERENTIAL/PLATELET
Abs Immature Granulocytes: 0.01 10*3/uL (ref 0.00–0.07)
Basophils Absolute: 0 10*3/uL (ref 0.0–0.1)
Basophils Relative: 1 %
Eosinophils Absolute: 0.1 10*3/uL (ref 0.0–0.5)
Eosinophils Relative: 1 %
HCT: 39 % (ref 36.0–46.0)
Hemoglobin: 12.2 g/dL (ref 12.0–15.0)
Immature Granulocytes: 0 %
Lymphocytes Relative: 24 %
Lymphs Abs: 1.8 10*3/uL (ref 0.7–4.0)
MCH: 29.8 pg (ref 26.0–34.0)
MCHC: 31.3 g/dL (ref 30.0–36.0)
MCV: 95.1 fL (ref 80.0–100.0)
Monocytes Absolute: 0.6 10*3/uL (ref 0.1–1.0)
Monocytes Relative: 7 %
Neutro Abs: 5.1 10*3/uL (ref 1.7–7.7)
Neutrophils Relative %: 67 %
Platelets: 173 10*3/uL (ref 150–400)
RBC: 4.1 MIL/uL (ref 3.87–5.11)
RDW: 15 % (ref 11.5–15.5)
WBC: 7.6 10*3/uL (ref 4.0–10.5)
nRBC: 0 % (ref 0.0–0.2)

## 2019-10-06 LAB — ECHOCARDIOGRAM COMPLETE
Height: 66 in
Weight: 2307.2 oz

## 2019-10-06 LAB — LIPID PANEL
Cholesterol: 106 mg/dL (ref 0–200)
HDL: 32 mg/dL — ABNORMAL LOW (ref >40)
LDL Cholesterol: 56 mg/dL (ref 0–99)
Total CHOL/HDL Ratio: 3.3 RATIO
Triglycerides: 89 mg/dL (ref <150)
VLDL: 18 mg/dL (ref 0–40)

## 2019-10-06 LAB — RESPIRATORY PANEL BY RT PCR (FLU A&B, COVID)
Influenza A by PCR: NEGATIVE
Influenza B by PCR: NEGATIVE
SARS Coronavirus 2 by RT PCR: NEGATIVE

## 2019-10-06 LAB — HEMOGLOBIN A1C
Hgb A1c MFr Bld: 5.5 % (ref 4.8–5.6)
Mean Plasma Glucose: 111.15 mg/dL

## 2019-10-06 LAB — BRAIN NATRIURETIC PEPTIDE: B Natriuretic Peptide: 2715 pg/mL — ABNORMAL HIGH (ref 0.0–100.0)

## 2019-10-06 MED ORDER — ONDANSETRON HCL 4 MG/2ML IJ SOLN: 4.0000 mg | Freq: Four times a day (QID) | INTRAMUSCULAR | Status: DC | PRN

## 2019-10-06 MED ORDER — SODIUM CHLORIDE 0.9% FLUSH
3.0000 mL | Freq: Two times a day (BID) | INTRAVENOUS | Status: DC
  Administered 2019-10-06 – 2019-10-10 (×9): 3 mL via INTRAVENOUS

## 2019-10-06 MED ORDER — TRAMADOL HCL 50 MG PO TABS
50.0000 mg | ORAL_TABLET | Freq: Four times a day (QID) | ORAL | Status: DC | PRN
  Administered 2019-10-07 – 2019-10-08 (×2): 50 mg via ORAL
  Filled 2019-10-06 (×2): qty 1

## 2019-10-06 MED ORDER — FUROSEMIDE 10 MG/ML IJ SOLN
40.0000 mg | Freq: Two times a day (BID) | INTRAMUSCULAR | Status: DC
  Administered 2019-10-07 – 2019-10-08 (×3): 40 mg via INTRAVENOUS
  Filled 2019-10-06 (×3): qty 4

## 2019-10-06 MED ORDER — TRAZODONE HCL 100 MG PO TABS
100.0000 mg | ORAL_TABLET | Freq: Every evening | ORAL | Status: DC | PRN
  Administered 2019-10-08: 100 mg via ORAL
  Filled 2019-10-06: qty 2

## 2019-10-06 MED ORDER — IPRATROPIUM-ALBUTEROL 0.5-2.5 (3) MG/3ML IN SOLN
3.0000 mL | Freq: Once | RESPIRATORY_TRACT | Status: AC
  Administered 2019-10-06: 3 mL via RESPIRATORY_TRACT
  Filled 2019-10-06: qty 3

## 2019-10-06 MED ORDER — SPIRONOLACTONE 12.5 MG HALF TABLET
12.5000 mg | ORAL_TABLET | Freq: Every day | ORAL | Status: DC
  Administered 2019-10-06 – 2019-10-10 (×5): 12.5 mg via ORAL
  Filled 2019-10-06 (×7): qty 1

## 2019-10-06 MED ORDER — FUROSEMIDE 10 MG/ML IJ SOLN
40.0000 mg | Freq: Two times a day (BID) | INTRAMUSCULAR | Status: DC
  Administered 2019-10-06: 40 mg via INTRAVENOUS
  Filled 2019-10-06: qty 4

## 2019-10-06 MED ORDER — ASPIRIN 81 MG PO CHEW
81.0000 mg | CHEWABLE_TABLET | Freq: Every day | ORAL | Status: DC
  Administered 2019-10-06 – 2019-10-10 (×5): 81 mg via ORAL
  Filled 2019-10-06 (×5): qty 1

## 2019-10-06 MED ORDER — ALBUTEROL SULFATE (2.5 MG/3ML) 0.083% IN NEBU
2.5000 mg | INHALATION_SOLUTION | Freq: Four times a day (QID) | RESPIRATORY_TRACT | Status: DC | PRN
  Administered 2019-10-07 – 2019-10-08 (×3): 2.5 mg via RESPIRATORY_TRACT
  Filled 2019-10-06 (×3): qty 3

## 2019-10-06 MED ORDER — CARVEDILOL 6.25 MG PO TABS
6.2500 mg | ORAL_TABLET | Freq: Two times a day (BID) | ORAL | Status: DC
  Administered 2019-10-06 – 2019-10-10 (×9): 6.25 mg via ORAL
  Filled 2019-10-06 (×4): qty 2
  Filled 2019-10-06 (×2): qty 1
  Filled 2019-10-06: qty 2
  Filled 2019-10-06: qty 1
  Filled 2019-10-06: qty 2

## 2019-10-06 MED ORDER — LISINOPRIL 10 MG PO TABS: 20.0000 mg | ORAL_TABLET | Freq: Every day | ORAL | Status: DC

## 2019-10-06 MED ORDER — FUROSEMIDE 10 MG/ML IJ SOLN: 20.0000 mg | Freq: Two times a day (BID) | INTRAMUSCULAR | Status: DC

## 2019-10-06 MED ORDER — ENOXAPARIN SODIUM 40 MG/0.4ML ~~LOC~~ SOLN
40.0000 mg | SUBCUTANEOUS | Status: DC
  Administered 2019-10-06 – 2019-10-09 (×4): 40 mg via SUBCUTANEOUS
  Filled 2019-10-06 (×4): qty 0.4

## 2019-10-06 MED ORDER — ALBUTEROL SULFATE HFA 108 (90 BASE) MCG/ACT IN AERS
8.0000 | INHALATION_SPRAY | Freq: Once | RESPIRATORY_TRACT | Status: AC
  Administered 2019-10-06: 8 via RESPIRATORY_TRACT
  Filled 2019-10-06: qty 6.7

## 2019-10-06 MED ORDER — FUROSEMIDE 10 MG/ML IJ SOLN
40.0000 mg | Freq: Once | INTRAMUSCULAR | Status: AC
  Administered 2019-10-06: 40 mg via INTRAVENOUS
  Filled 2019-10-06: qty 4

## 2019-10-06 MED ORDER — SODIUM CHLORIDE 0.9 % IV SOLN: 250.0000 mL | INTRAVENOUS | Status: DC | PRN

## 2019-10-06 MED ORDER — PREDNISONE 50 MG PO TABS
60.0000 mg | ORAL_TABLET | Freq: Once | ORAL | Status: AC
  Administered 2019-10-06: 60 mg via ORAL
  Filled 2019-10-06: qty 1

## 2019-10-06 MED ORDER — NICOTINE 21 MG/24HR TD PT24
21.0000 mg | MEDICATED_PATCH | Freq: Every day | TRANSDERMAL | Status: DC
  Administered 2019-10-06 – 2019-10-10 (×5): 21 mg via TRANSDERMAL
  Filled 2019-10-06 (×5): qty 1

## 2019-10-06 MED ORDER — CLOPIDOGREL BISULFATE 75 MG PO TABS
75.0000 mg | ORAL_TABLET | Freq: Every day | ORAL | Status: DC
  Administered 2019-10-06 – 2019-10-10 (×5): 75 mg via ORAL
  Filled 2019-10-06 (×5): qty 1

## 2019-10-06 MED ORDER — ATORVASTATIN CALCIUM 40 MG PO TABS
40.0000 mg | ORAL_TABLET | Freq: Every day | ORAL | Status: DC
  Administered 2019-10-06 – 2019-10-10 (×5): 40 mg via ORAL
  Filled 2019-10-06 (×5): qty 1

## 2019-10-06 MED ORDER — METHYLPREDNISOLONE SODIUM SUCC 40 MG IJ SOLR
40.0000 mg | Freq: Four times a day (QID) | INTRAMUSCULAR | Status: DC
  Administered 2019-10-06: 40 mg via INTRAVENOUS
  Filled 2019-10-06: qty 1

## 2019-10-06 MED ORDER — ONDANSETRON HCL 4 MG PO TABS: 4.0000 mg | ORAL_TABLET | Freq: Four times a day (QID) | ORAL | Status: DC | PRN

## 2019-10-06 MED ORDER — SODIUM CHLORIDE 0.9% FLUSH: 3.0000 mL | INTRAVENOUS | Status: DC | PRN

## 2019-10-06 MED ORDER — METHYLPREDNISOLONE SODIUM SUCC 40 MG IJ SOLR
40.0000 mg | INTRAMUSCULAR | Status: AC
  Administered 2019-10-07 – 2019-10-08 (×2): 40 mg via INTRAVENOUS
  Filled 2019-10-06 (×2): qty 1

## 2019-10-06 NOTE — H&P (Signed)
TRH H&P    Patient Demographics:    [name], is a 74 y.o. female  MRN: [id number]  DOB - 05/09/1946  Admit Date - 10/05/2019  Referring MD/NP/PA: Dr. Christy Gentles  Outpatient Primary MD for the patient is Carolee Rota, NP  Patient coming from: Home  Chief complaint-shortness of breath   HPI:    [name]  is a 74 y.o. female, with history of hypertension, hyperlipidemia, GERD, chronic diastolic CHF, COPD, tobacco use came to hospital with complaints of shortness of breath.  Patient stated shortness of breath has been going on for past several months.  She stated she felt worsening of shortness of breath so she came to hospital.   She denies chest pain. Denies nausea vomiting or diarrhea. Denies abdominal pain Chest x-ray shows interstitial edema. Patient was given 1 dose of Lasix 40 mg IV in the ED.  Also started our p.o. prednisone 60 mg x 1, DuoNeb nebulizer.    Review of systems:    In addition to the HPI above,    All other systems reviewed and are negative.    Past History of the following :    Past Medical History:  Diagnosis Date  . Breast cancer ([place])   . Carotid artery stenosis   . COPD (chronic obstructive pulmonary disease) ([place])   . Coronary artery disease   . Depression   . Enlargement of lymph node   . GERD (gastroesophageal reflux disease)   . Hyperlipidemia   . Hypertension   . LBP (low back pain)   . Mitral regurgitation   . Osteoarthritis   . Sciatica   . Systolic congestive heart failure ([place])   . Tobacco user   . Urge urinary incontinence       Past Surgical History:  Procedure Laterality Date  . APPENDECTOMY     as a child  . BACK SURGERY  1995  . bare-metal stenting of critical mid ICA stenosis     w/ ulcerated plaque/thrombus  . CAROTID ENDARTERECTOMY    . [place]  . CHOLECYSTECTOMY  1980s  . MASTECTOMY  1992   L       Social History:      Social History   Tobacco Use  . Smoking status: Current Every Day Smoker    Packs/day: 1.00    Years: 51.00    Pack years: 51.00    Types: Cigarettes  . Smokeless tobacco: Never Used  Substance Use Topics  . Alcohol use: No       Family History :     Family History  Problem Relation Age of Onset  . Diabetes Mother   . Heart attack Mother        deceased at age 34  . Hypertension Mother   . Lung cancer Father        deceased at age 20  . Stroke Father   . Stroke Brother        deceased at age 58s  . Heart disease Brother  Home Medications:   Prior to Admission medications   Medication Sig Start Date End Date Taking? Authorizing Provider  albuterol (PROVENTIL) (2.5 MG/3ML) 0.083% nebulizer solution Take 3 mLs (2.5 mg total) by nebulization every 6 (six) hours as needed for wheezing or shortness of breath. 09/01/13   Chesley Mires, MD  aspirin 81 MG tablet Take 81 mg by mouth daily.    [provider]  carvedilol (COREG) 6.25 MG tablet Take 6.25 mg by mouth 2 (two) times daily with a meal.      [provider]  clopidogrel (PLAVIX) 75 MG tablet Take 75 mg by mouth daily.      [provider]  furosemide (LASIX) 40 MG tablet Take 40 mg by mouth daily.      [provider]  hydrocodone-acetaminophen (LORCET-HD) 5-500 MG per capsule Take 1 capsule by mouth every 4 (four) hours as needed.      [provider]  lisinopril (PRINIVIL,ZESTRIL) 20 MG tablet Take 20 mg by mouth daily.      [provider]  potassium chloride (KLOR-CON) 20 MEQ packet Take 20 mEq by mouth daily.      [provider]  simvastatin (ZOCOR) 80 MG tablet Take 80 mg by mouth at bedtime.      [provider]  buPROPion (WELLBUTRIN XL) 150 MG 24 hr tablet Take 1 tablet (150 mg total) by mouth daily. 01/26/11 08/28/11  Josue Hector, MD  fluticasone (FLONASE) 50 MCG/ACT nasal spray Place 2 sprays into  the nose daily.    08/28/11  [provider]     Allergies:     Allergies  Allergen Reactions  . Gabapentin Swelling     Physical Exam:   Vitals  Blood pressure 111/68, pulse 67, temperature 99 F (37.2 C), resp. rate (!) 23, height 5\' 6"  (1.676 m), weight 65.4 kg, SpO2 95 %.  1.  General: Appears in no acute distress  2. Psychiatric: Alert, oriented x3, no focal deficit noted  3. Neurologic: Cranial nerves II through XII grossly intact, no focal deficit noted  4. HEENMT:  Atraumatic normocephalic, extraocular muscles are intact  5. Respiratory : Scattered wheezing bilaterally  6. Cardiovascular : S1-S2, regular, no murmur auscultated, trace edema in the lower extremities  7. Gastrointestinal:  Abdomen is soft, nontender, no organomegaly      Data Review:    CBC Recent Labs  Lab 10/06/19 0150  WBC 7.6  HGB 12.2  HCT 39.0  PLT 173  MCV 95.1  MCH 29.8  MCHC 31.3  RDW 15.0  LYMPHSABS 1.8  MONOABS 0.6  EOSABS 0.1  BASOSABS 0.0   ------------------------------------------------------------------------------------------------------------------  Results for orders placed or performed during the hospital encounter of 10/05/19 (from the past 48 hour(s))  Respiratory Panel by RT PCR (Flu A&B, Covid) - Nasopharyngeal Swab     Status: None   Collection Time: 10/06/19  1:26 AM   Specimen: Nasopharyngeal Swab  Result Value Ref Range   SARS Coronavirus 2 by RT PCR NEGATIVE NEGATIVE    Comment: (NOTE) SARS-CoV-2 target nucleic acids are NOT DETECTED. The SARS-CoV-2 RNA is generally detectable in upper respiratoy specimens during the acute phase of infection. The lowest concentration of SARS-CoV-2 viral copies this assay can detect is 131 copies/mL. A negative result does not preclude SARS-Cov-2 infection and should not be used as the sole basis for treatment or other patient management decisions. A negative result may occur with  improper specimen  collection/handling, submission of specimen other  than nasopharyngeal swab, presence of viral mutation(s) within the areas targeted by this assay, and inadequate number of viral copies (<131 copies/mL). A negative result must be combined with clinical observations, patient history, and epidemiological information. The expected result is Negative. Fact Sheet for Patients:  PinkCheek.be Fact Sheet for Healthcare Providers:  GravelBags.it This test is not yet ap proved or cleared by the [place] FDA and  has been authorized for detection and/or diagnosis of SARS-CoV-2 by FDA under an Emergency Use Authorization (EUA). This EUA will remain  in effect (meaning this test can be used) for the duration of the COVID-19 declaration under Section 564(b)(1) of the Act, 21 U.S.C. section 360bbb-3(b)(1), unless the authorization is terminated or revoked sooner.    Influenza A by PCR NEGATIVE NEGATIVE   Influenza B by PCR NEGATIVE NEGATIVE    Comment: (NOTE) The Xpert Xpress SARS-CoV-2/FLU/RSV assay is intended as an aid in  the diagnosis of influenza from Nasopharyngeal swab specimens and  should not be used as a sole basis for treatment. Nasal washings and  aspirates are unacceptable for Xpert Xpress SARS-CoV-2/FLU/RSV  testing. Fact Sheet for Patients: PinkCheek.be Fact Sheet for Healthcare Providers: GravelBags.it This test is not yet approved or cleared by the [place] FDA and  has been authorized for detection and/or diagnosis of SARS-CoV-2 by  FDA under an Emergency Use Authorization (EUA). This EUA will remain  in effect (meaning this test can be used) for the duration of the  Covid-19 declaration under Section 564(b)(1) of the Act, 21  U.S.C. section 360bbb-3(b)(1), unless the authorization is  terminated or revoked. Performed at [facility], [address]  [place], [place] [address]   CBC with Differential/Platelet     Status: None   Collection Time: 10/06/19  1:50 AM  Result Value Ref Range   WBC 7.6 4.0 - 10.5 K/uL   RBC 4.10 3.87 - 5.11 MIL/uL   Hemoglobin 12.2 12.0 - 15.0 g/dL   HCT 39.0 36.0 - 46.0 %   MCV 95.1 80.0 - 100.0 fL   MCH 29.8 26.0 - 34.0 pg   MCHC 31.3 30.0 - 36.0 g/dL   RDW 15.0 11.5 - 15.5 %   Platelets 173 150 - 400 K/uL   nRBC 0.0 0.0 - 0.2 %   Neutrophils Relative % 67 %   Neutro Abs 5.1 1.7 - 7.7 K/uL   Lymphocytes Relative 24 %   Lymphs Abs 1.8 0.7 - 4.0 K/uL   Monocytes Relative 7 %   Monocytes Absolute 0.6 0.1 - 1.0 K/uL   Eosinophils Relative 1 %   Eosinophils Absolute 0.1 0.0 - 0.5 K/uL   Basophils Relative 1 %   Basophils Absolute 0.0 0.0 - 0.1 K/uL   Immature Granulocytes 0 %   Abs Immature Granulocytes 0.01 0.00 - 0.07 K/uL    Comment: Performed at [facility], [address] [place], [place] [address]  Comprehensive metabolic panel     Status: Abnormal   Collection Time: 10/06/19  1:50 AM  Result Value Ref Range   Sodium 135 135 - 145 mmol/L   Potassium 3.7 3.5 - 5.1 mmol/L   Chloride 99 98 - 111 mmol/L   CO2 26 22 - 32 mmol/L   Glucose, Bld 101 (H) 70 - 99 mg/dL    Comment: Glucose reference range applies only to samples taken after fasting for at least 8 hours.   BUN 9 8 - 23 mg/dL   Creatinine, Ser 0.89 0.44 - 1.00 mg/dL  Calcium 8.5 (L) 8.9 - 10.3 mg/dL   Total Protein 6.8 6.5 - 8.1 g/dL   Albumin 3.6 3.5 - 5.0 g/dL   AST 16 15 - 41 U/L   ALT 10 0 - 44 U/L   Alkaline Phosphatase 56 38 - 126 U/L   Total Bilirubin 0.6 0.3 - 1.2 mg/dL   GFR calc non Af Amer >60 >60 mL/min   GFR calc Af Amer >60 >60 mL/min   Anion gap 10 5 - 15    Comment: Performed at [facility], [address] [place], [place] [address]  Brain natriuretic peptide     Status: Abnormal   Collection Time: 10/06/19  1:50 AM  Result Value Ref Range   B Natriuretic Peptide 2,715.0 (H) 0.0 - 100.0 pg/mL    Comment:  Performed at [facility], [address] [place], [place] [address]    Chemistries  Recent Labs  Lab 10/06/19 0150  NA 135  K 3.7  CL 99  CO2 26  GLUCOSE 101*  BUN 9  CREATININE 0.89  CALCIUM 8.5*  AST 16  ALT 10  ALKPHOS 56  BILITOT 0.6   ------------------------------------------------------------------------------------------------------------------  ------------------------------------------------------------------------------------------------------------------ GFR: Estimated Creatinine Clearance: 50.3 mL/min (by C-G formula based on SCr of 0.89 mg/dL). Liver Function Tests: Recent Labs  Lab 10/06/19 0150  AST 16  ALT 10  ALKPHOS 56  BILITOT 0.6  PROT 6.8  ALBUMIN 3.6   No results for input(s): LIPASE, AMYLASE in the last 168 hours. No results for input(s): AMMONIA in the last 168 hours. Coagulation Profile: No results for input(s): INR, PROTIME in the last 168 hours. Cardiac Enzymes: No results for input(s): CKTOTAL, CKMB, CKMBINDEX, TROPONINI in the last 168 hours. BNP (last 3 results) No results for input(s): PROBNP in the last 8760 hours. HbA1C: No results for input(s): HGBA1C in the last 72 hours. CBG: No results for input(s): GLUCAP in the last 168 hours. Lipid Profile: No results for input(s): CHOL, HDL, LDLCALC, TRIG, CHOLHDL, LDLDIRECT in the last 72 hours. Thyroid Function Tests: No results for input(s): TSH, T4TOTAL, FREET4, T3FREE, THYROIDAB in the last 72 hours. Anemia Panel: No results for input(s): VITAMINB12, FOLATE, FERRITIN, TIBC, IRON, RETICCTPCT in the last 72 hours.  --------------------------------------------------------------------------------------------------------------- Urine analysis:    Component Value Date/Time   COLORURINE YELLOW 11/13/2009 1557   APPEARANCEUR CLEAR 11/13/2009 1557   LABSPEC 1.010 11/13/2009 1557   PHURINE 6.0 11/13/2009 1557   GLUCOSEU NEGATIVE 11/13/2009 1557   HGBUR NEGATIVE 11/13/2009 1557    BILIRUBINUR NEGATIVE 11/13/2009 1557   KETONESUR NEGATIVE 11/13/2009 1557   PROTEINUR NEGATIVE 11/13/2009 1557   UROBILINOGEN 0.2 11/13/2009 1557   NITRITE NEGATIVE 11/13/2009 1557   LEUKOCYTESUR  11/13/2009 1557    NEGATIVE MICROSCOPIC NOT DONE ON URINES WITH NEGATIVE PROTEIN, BLOOD, LEUKOCYTES, NITRITE, OR GLUCOSE <1000 mg/dL.      Imaging Results:    DG Chest Port 1 View  Result Date: 10/06/2019 CLINICAL DATA:  Shortness of breath EXAM: PORTABLE CHEST 1 VIEW COMPARISON:  April 28, 2014 FINDINGS: There is moderate cardiomegaly. Aortic knob calcifications. Prominence of the pulmonary vasculature with diffuse interstitial markings throughout. No large airspace consolidation. A trace right pleural effusion is present. No acute osseous abnormality. IMPRESSION: Cardiomegaly and interstitial edema. Small right pleural effusion. Electronically Signed   By: Prudencio Pair M.D.   On: 10/06/2019 01:19    My personal review of EKG: Rhythm NSR, no ST changes   Assessment & Plan:    Active Problems:  COPD exacerbation ([place])   1. Acute on chronic diastolic CHF-patient received Lasix 40 mg IV in the ED.  Will start Lasix 20 mg IV every 12 hours.  Patient's last echocardiogram was in 2001.  Will obtain echocardiogram in a.m.  Strict intake and output. 2. COPD exacerbation-patient has multiple exacerbation, will start Solu-Medrol 40 mg IV every 12 hours.  DuoNeb nebulizers every 6 as needed. 3. Hypertension-continue lisinopril, Coreg    DVT Prophylaxis-   Lovenox   AM Labs Ordered, also please review Full Orders  Family Communication: Admission, patients condition and plan of care including tests being ordered have been discussed with the patient  who indicate understanding and agree with the plan and Code Status.  Code Status: Full code  Admission status: Observation  Time spent in minutes : 60 minutes   Bekki Tavenner S Nigil Braman M.D

## 2019-10-06 NOTE — ED Notes (Signed)
ED TO INPATIENT HANDOFF REPORT  ED Nurse Name and Phone #:  Fabio Neighbors RN  S Name/Age/Gender [name] 74 y.o. female Room/Bed: APA09/APA09  Code Status   Code Status: Full Code  Home/SNF/Other Home Patient oriented to: self, place, time and situation Is this baseline? Yes   Triage Complete: Triage complete  Chief Complaint COPD exacerbation ([place]) [J44.1]  Triage Note [facility] - pt c/o SOB x 3 months. Pt's O2 96% on RA    Allergies Allergies  Allergen Reactions  . Gabapentin Swelling    Level of Care/Admitting Diagnosis ED Disposition    ED Disposition Condition [facility] Area: [facility] [id number]  Level of Care: Telemetry [5]  Covid Evaluation: Asymptomatic Screening Protocol (No Symptoms)  Diagnosis: COPD exacerbation [facility]) [id number]  Admitting Physician: Loree Fee  Attending Physician: Loree Fee       B Medical/Surgery History Past Medical History:  Diagnosis Date  . Breast cancer ([place])   . Carotid artery stenosis   . COPD (chronic obstructive pulmonary disease) ([place])   . Coronary artery disease   . Depression   . Enlargement of lymph node   . GERD (gastroesophageal reflux disease)   . Hyperlipidemia   . Hypertension   . LBP (low back pain)   . Mitral regurgitation   . Osteoarthritis   . Sciatica   . Systolic congestive heart failure ([place])   . Tobacco user   . Urge urinary incontinence    Past Surgical History:  Procedure Laterality Date  . APPENDECTOMY     as a child  . BACK SURGERY  1995  . bare-metal stenting of critical mid ICA stenosis     w/ ulcerated plaque/thrombus  . CAROTID ENDARTERECTOMY    . [place]  . CHOLECYSTECTOMY  1980s  . MASTECTOMY  1992   L     A IV Location/Drains/Wounds Patient Lines/Drains/Airways Status   Active Line/Drains/Airways    Name:   Placement date:   Placement time:   Site:   Days:   Peripheral IV 10/06/19 Right  Forearm   10/06/19    1115    Forearm   less than 1          Intake/Output Last 24 hours  Intake/Output Summary (Last 24 hours) at 10/06/2019 1116 Last data filed at 10/06/2019 [id number] Gross per 24 hour  Intake 280 ml  Output 1950 ml  Net -1670 ml    Labs/Imaging Results for orders placed or performed during the hospital encounter of 10/05/19 (from the past 48 hour(s))  Respiratory Panel by RT PCR (Flu A&B, Covid) - Nasopharyngeal Swab     Status: None   Collection Time: 10/06/19  1:26 AM   Specimen: Nasopharyngeal Swab  Result Value Ref Range   SARS Coronavirus 2 by RT PCR NEGATIVE NEGATIVE    Comment: (NOTE) SARS-CoV-2 target nucleic acids are NOT DETECTED. The SARS-CoV-2 RNA is generally detectable in upper respiratoy specimens during the acute phase of infection. The lowest concentration of SARS-CoV-2 viral copies this assay can detect is 131 copies/mL. A negative result does not preclude SARS-Cov-2 infection and should not be used as the sole basis for treatment or other patient management decisions. A negative result may occur with  improper specimen collection/handling, submission of specimen other than nasopharyngeal swab, presence of viral mutation(s) within the areas targeted by this assay, and inadequate number of viral copies (<131 copies/mL). A negative result must  be combined with clinical observations, patient history, and epidemiological information. The expected result is Negative. Fact Sheet for Patients:  PinkCheek.be Fact Sheet for Healthcare Providers:  GravelBags.it This test is not yet ap proved or cleared by the [place] FDA and  has been authorized for detection and/or diagnosis of SARS-CoV-2 by FDA under an Emergency Use Authorization (EUA). This EUA will remain  in effect (meaning this test can be used) for the duration of the COVID-19 declaration under Section 564(b)(1) of the Act, 21  U.S.C. section 360bbb-3(b)(1), unless the authorization is terminated or revoked sooner.    Influenza A by PCR NEGATIVE NEGATIVE   Influenza B by PCR NEGATIVE NEGATIVE    Comment: (NOTE) The Xpert Xpress SARS-CoV-2/FLU/RSV assay is intended as an aid in  the diagnosis of influenza from Nasopharyngeal swab specimens and  should not be used as a sole basis for treatment. Nasal washings and  aspirates are unacceptable for Xpert Xpress SARS-CoV-2/FLU/RSV  testing. Fact Sheet for Patients: PinkCheek.be Fact Sheet for Healthcare Providers: GravelBags.it This test is not yet approved or cleared by the [place] FDA and  has been authorized for detection and/or diagnosis of SARS-CoV-2 by  FDA under an Emergency Use Authorization (EUA). This EUA will remain  in effect (meaning this test can be used) for the duration of the  Covid-19 declaration under Section 564(b)(1) of the Act, 21  U.S.C. section 360bbb-3(b)(1), unless the authorization is  terminated or revoked. Performed at [facility], [address] [place], [place] [address]   CBC with Differential/Platelet     Status: None   Collection Time: 10/06/19  1:50 AM  Result Value Ref Range   WBC 7.6 4.0 - 10.5 K/uL   RBC 4.10 3.87 - 5.11 MIL/uL   Hemoglobin 12.2 12.0 - 15.0 g/dL   HCT 39.0 36.0 - 46.0 %   MCV 95.1 80.0 - 100.0 fL   MCH 29.8 26.0 - 34.0 pg   MCHC 31.3 30.0 - 36.0 g/dL   RDW 15.0 11.5 - 15.5 %   Platelets 173 150 - 400 K/uL   nRBC 0.0 0.0 - 0.2 %   Neutrophils Relative % 67 %   Neutro Abs 5.1 1.7 - 7.7 K/uL   Lymphocytes Relative 24 %   Lymphs Abs 1.8 0.7 - 4.0 K/uL   Monocytes Relative 7 %   Monocytes Absolute 0.6 0.1 - 1.0 K/uL   Eosinophils Relative 1 %   Eosinophils Absolute 0.1 0.0 - 0.5 K/uL   Basophils Relative 1 %   Basophils Absolute 0.0 0.0 - 0.1 K/uL   Immature Granulocytes 0 %   Abs Immature Granulocytes 0.01 0.00 - 0.07 K/uL    Comment:  Performed at [facility], [address] [place], [place] [address]  Comprehensive metabolic panel     Status: Abnormal   Collection Time: 10/06/19  1:50 AM  Result Value Ref Range   Sodium 135 135 - 145 mmol/L   Potassium 3.7 3.5 - 5.1 mmol/L   Chloride 99 98 - 111 mmol/L   CO2 26 22 - 32 mmol/L   Glucose, Bld 101 (H) 70 - 99 mg/dL    Comment: Glucose reference range applies only to samples taken after fasting for at least 8 hours.   BUN 9 8 - 23 mg/dL   Creatinine, Ser 0.89 0.44 - 1.00 mg/dL   Calcium 8.5 (L) 8.9 - 10.3 mg/dL   Total Protein 6.8 6.5 - 8.1 g/dL   Albumin 3.6 3.5 - 5.0  g/dL   AST 16 15 - 41 U/L   ALT 10 0 - 44 U/L   Alkaline Phosphatase 56 38 - 126 U/L   Total Bilirubin 0.6 0.3 - 1.2 mg/dL   GFR calc non Af Amer >60 >60 mL/min   GFR calc Af Amer >60 >60 mL/min   Anion gap 10 5 - 15    Comment: Performed at [facility], [address] [place], [place] [address]  Brain natriuretic peptide     Status: Abnormal   Collection Time: 10/06/19  1:50 AM  Result Value Ref Range   B Natriuretic Peptide 2,715.0 (H) 0.0 - 100.0 pg/mL    Comment: Performed at [facility], [address], [place], [place] [address]  Lipid panel     Status: Abnormal   Collection Time: 10/06/19  1:50 AM  Result Value Ref Range   Cholesterol 106 0 - 200 mg/dL   Triglycerides 89 <150 mg/dL   HDL 32 (L) >40 mg/dL   Total CHOL/HDL Ratio 3.3 RATIO   VLDL 18 0 - 40 mg/dL   LDL Cholesterol 56 0 - 99 mg/dL    Comment:        Total Cholesterol/HDL:CHD Risk Coronary Heart Disease Risk Table                     Men   Women  1/2 Average Risk   3.4   3.3  Average Risk       5.0   4.4  2 X Average Risk   9.6   7.1  3 X Average Risk  23.4   11.0        Use the calculated Patient Ratio above and the CHD Risk Table to determine the patient's CHD Risk.        ATP III CLASSIFICATION (LDL):  <100     mg/dL   Optimal  100-129  mg/dL   Near or Above                    Optimal  130-159  mg/dL    Borderline  160-189  mg/dL   High  >190     mg/dL   Very High Performed at [facility] [place], [place] [address]    DG Chest Port 1 View  Result Date: 10/06/2019 CLINICAL DATA:  Shortness of breath EXAM: PORTABLE CHEST 1 VIEW COMPARISON:  April 28, 2014 FINDINGS: There is moderate cardiomegaly. Aortic knob calcifications. Prominence of the pulmonary vasculature with diffuse interstitial markings throughout. No large airspace consolidation. A trace right pleural effusion is present. No acute osseous abnormality. IMPRESSION: Cardiomegaly and interstitial edema. Small right pleural effusion. Electronically Signed   By: Prudencio Pair M.D.   On: 10/06/2019 01:19    Pending Labs Unresulted Labs (From admission, onward)    Start     Ordered   10/13/19 0500  Creatinine, serum  (enoxaparin (LOVENOX)    CrCl >/= 30 ml/min)  Weekly,   R    Comments: while on enoxaparin therapy    10/06/19 0720   10/07/19 0500  CBC  Tomorrow morning,   R     10/06/19 0720   10/07/19 0500  Comprehensive metabolic panel  Tomorrow morning,   R     10/06/19 0720   10/06/19 0928  Hemoglobin A1c  Add-on,   AD     10/06/19 0928          Vitals/Pain Today's Vitals   10/06/19  0530 10/06/19 0600 10/06/19 0630 10/06/19 0817  BP: (!) 100/57 110/65 112/63   Pulse: 61 71 68   Resp: (!) 21 (!) 21 (!) 23   Temp:      SpO2: 91% 94% 91%   Weight:      Height:      PainSc:    Asleep    Isolation Precautions No active isolations  Medications Medications  aspirin chewable tablet 81 mg (has no administration in time range)  carvedilol (COREG) tablet 6.25 mg (6.25 mg Oral Given 10/06/19 0832)  lisinopril (ZESTRIL) tablet 20 mg (has no administration in time range)  atorvastatin (LIPITOR) tablet 40 mg (has no administration in time range)  clopidogrel (PLAVIX) tablet 75 mg (has no administration in time range)  albuterol (PROVENTIL) (2.5 MG/3ML) 0.083% nebulizer solution 2.5 mg (has no  administration in time range)  enoxaparin (LOVENOX) injection 40 mg (has no administration in time range)  sodium chloride flush (NS) 0.9 % injection 3 mL (has no administration in time range)  sodium chloride flush (NS) 0.9 % injection 3 mL (has no administration in time range)  0.9 %  sodium chloride infusion (has no administration in time range)  ondansetron (ZOFRAN) tablet 4 mg (has no administration in time range)    Or  ondansetron (ZOFRAN) injection 4 mg (has no administration in time range)  methylPREDNISolone sodium succinate (SOLU-MEDROL) 40 mg/mL injection 40 mg (has no administration in time range)  furosemide (LASIX) injection 40 mg (has no administration in time range)  furosemide (LASIX) injection 40 mg (40 mg Intravenous Given 10/06/19 0255)  albuterol (VENTOLIN HFA) 108 (90 Base) MCG/ACT inhaler 8 puff (8 puffs Inhalation Given 10/06/19 0255)  predniSONE (DELTASONE) tablet 60 mg (60 mg Oral Given 10/06/19 0318)  ipratropium-albuterol (DUONEB) 0.5-2.5 (3) MG/3ML nebulizer solution 3 mL (3 mLs Nebulization Given 10/06/19 0359)    Mobility walks Low fall risk   Focused Assessments    R Recommendations: See Admitting Provider Note  Report given to: Roselyn Reef RN [phone]  Additional Notes:

## 2019-10-06 NOTE — ED Notes (Signed)
Informed pt that her daughter called to check on her. Dietary called and breakfast tray ordered

## 2019-10-06 NOTE — ED Provider Notes (Signed)
[facility] EMERGENCY DEPARTMENT Provider Note   CSN: [id number] Arrival date & time: 10/05/19  2322     History Chief Complaint  Patient presents with  . Shortness of Breath    [name] is a 74 y.o. female.  The history is provided by the patient.  Shortness of Breath Severity:  Moderate Onset quality:  Gradual Duration:  3 months Timing:  Constant Progression:  Worsening Chronicity:  New Relieved by:  Nothing Worsened by:  Activity Associated symptoms: cough and wheezing   Associated symptoms: no chest pain, no fever, no hemoptysis and no vomiting   Risk factors: tobacco use   Patient history of CAD, COPD currently smoking, hypertension, hyperlipidemia presents with shortness of breath.  Patient reports has been having increasing shortness of breath in the past several months.  She reports she is short of breath with exertion.  She reports tonight she decided to finally have it evaluated. No active chest pain this time.  No hemoptysis.     Past Medical History:  Diagnosis Date  . Breast cancer ([place])   . Carotid artery stenosis   . COPD (chronic obstructive pulmonary disease) ([place])   . Coronary artery disease   . Depression   . Enlargement of lymph node   . GERD (gastroesophageal reflux disease)   . Hyperlipidemia   . Hypertension   . LBP (low back pain)   . Mitral regurgitation   . Osteoarthritis   . Sciatica   . Systolic congestive heart failure ([place])   . Tobacco user   . Urge urinary incontinence     Patient Active Problem List   Diagnosis Date Noted  . MITRAL REGURGITATION 03/26/2009  . Coronary atherosclerosis 03/26/2009  . Bilateral carotid bruits 03/26/2009  . Tobacco abuse 02/23/2009  . DEPRESSION 09/04/2007  . CONGESTIVE HEART FAILURE 09/04/2007  . GERD 09/04/2007  . OSTEOARTHRITIS 09/04/2007  . LOW BACK PAIN 09/04/2007  . Elevated lipids 09/02/2007  . HYPERTENSION, BENIGN ESSENTIAL 09/02/2007  . CHRONIC DIASTOLIC HEART FAILURE  [id number]  . COPD with chronic bronchitis ([place]) 09/02/2007  . URINARY INCONTINENCE, URGE 09/02/2007    Past Surgical History:  Procedure Laterality Date  . APPENDECTOMY     as a child  . BACK SURGERY  1995  . bare-metal stenting of critical mid ICA stenosis     w/ ulcerated plaque/thrombus  . CAROTID ENDARTERECTOMY    . [place]  . CHOLECYSTECTOMY  1980s  . MASTECTOMY  1992   L     OB History   No obstetric history on file.     Family History  Problem Relation Age of Onset  . Diabetes Mother   . Heart attack Mother        deceased at age 22  . Hypertension Mother   . Lung cancer Father        deceased at age 29  . Stroke Father   . Stroke Brother        deceased at age 13s  . Heart disease Brother     Social History   Tobacco Use  . Smoking status: Current Every Day Smoker    Packs/day: 1.00    Years: 51.00    Pack years: 51.00    Types: Cigarettes  . Smokeless tobacco: Never Used  Substance Use Topics  . Alcohol use: No  . Drug use: No    Home Medications Prior to Admission medications   Medication Sig Start Date End Date Taking?  Authorizing Provider  albuterol (PROVENTIL) (2.5 MG/3ML) 0.083% nebulizer solution Take 3 mLs (2.5 mg total) by nebulization every 6 (six) hours as needed for wheezing or shortness of breath. 09/01/13   Chesley Mires, MD  aspirin 81 MG tablet Take 81 mg by mouth daily.    [provider]  carvedilol (COREG) 6.25 MG tablet Take 6.25 mg by mouth 2 (two) times daily with a meal.      [provider]  clopidogrel (PLAVIX) 75 MG tablet Take 75 mg by mouth daily.      [provider]  furosemide (LASIX) 40 MG tablet Take 40 mg by mouth daily.      [provider]  hydrocodone-acetaminophen (LORCET-HD) 5-500 MG per capsule Take 1 capsule by mouth every 4 (four) hours as needed.      [provider]  lisinopril (PRINIVIL,ZESTRIL) 20 MG tablet Take 20 mg by mouth daily.       [provider]  potassium chloride (KLOR-CON) 20 MEQ packet Take 20 mEq by mouth daily.      [provider]  simvastatin (ZOCOR) 80 MG tablet Take 80 mg by mouth at bedtime.      [provider]  buPROPion (WELLBUTRIN XL) 150 MG 24 hr tablet Take 1 tablet (150 mg total) by mouth daily. 01/26/11 08/28/11  Josue Hector, MD  fluticasone (FLONASE) 50 MCG/ACT nasal spray Place 2 sprays into the nose daily.    08/28/11  [provider]    Allergies    Gabapentin  Review of Systems   Review of Systems  Constitutional: Negative for fever.  Respiratory: Positive for cough, shortness of breath and wheezing. Negative for hemoptysis.   Cardiovascular: Positive for leg swelling. Negative for chest pain.  Gastrointestinal: Positive for diarrhea. Negative for vomiting.       H/o IBS   All other systems reviewed and are negative.   Physical Exam Updated Vital Signs BP 100/72   Pulse 73   Temp 99 F (37.2 C)   Resp 17   Ht 1.676 m (5\' 6" )   Wt 65.4 kg   SpO2 94%   BMI 23.27 kg/m   Physical Exam CONSTITUTIONAL: Elderly, ill-appearing HEAD: Normocephalic/atraumatic EYES: EOMI/PERRL ENMT: Mucous membranes moist NECK: supple no meningeal signs + JVD SPINE/BACK:entire spine nontender CV: S1/S2 noted, no murmurs/rubs/gallops noted LUNGS: Wheezing bilaterally, faint crackles are noted ABDOMEN: soft, nontender, no rebound or guarding, bowel sounds noted throughout abdomen GU:no cva tenderness NEURO: Pt is awake/alert/appropriate, moves all extremitiesx4.  No facial droop.  EXTREMITIES: pulses normal/equal, full ROM, 1+ pitting edema bilateral lower extremities SKIN: warm, color normal PSYCH: no abnormalities of mood noted, alert and oriented to situation  ED Results / Procedures / Treatments   Labs (all labs ordered are listed, but only abnormal results are displayed) Labs Reviewed  COMPREHENSIVE METABOLIC PANEL - Abnormal; Notable for the  following components:      Result Value   Glucose, Bld 101 (*)    Calcium 8.5 (*)    All other components within normal limits  BRAIN NATRIURETIC PEPTIDE - Abnormal; Notable for the following components:   B Natriuretic Peptide 2,715.0 (*)    All other components within normal limits  RESPIRATORY PANEL BY RT PCR (FLU A&B, COVID)  CBC WITH DIFFERENTIAL/PLATELET    EKG EKG Interpretation  Date/Time:  Thursday October 05 2019 23:46:49 EDT Ventricular Rate:  78 PR Interval:    QRS Duration: 164 QT Interval:  435 QTC Calculation: 496 R  Axis:   -70 Text Interpretation: Sinus or ectopic atrial rhythm Left bundle branch block Baseline wander in lead(s) II III aVF V2 Confirmed by Ripley Fraise [phone]) on 10/06/2019 12:44:02 AM   Radiology DG Chest Port 1 View  Result Date: 10/06/2019 CLINICAL DATA:  Shortness of breath EXAM: PORTABLE CHEST 1 VIEW COMPARISON:  April 28, 2014 FINDINGS: There is moderate cardiomegaly. Aortic knob calcifications. Prominence of the pulmonary vasculature with diffuse interstitial markings throughout. No large airspace consolidation. A trace right pleural effusion is present. No acute osseous abnormality. IMPRESSION: Cardiomegaly and interstitial edema. Small right pleural effusion. Electronically Signed   By: Prudencio Pair M.D.   On: 10/06/2019 01:19    Procedures Procedures   Medications Ordered in ED Medications  furosemide (LASIX) injection 40 mg (40 mg Intravenous Given 10/06/19 0255)  albuterol (VENTOLIN HFA) 108 (90 Base) MCG/ACT inhaler 8 puff (8 puffs Inhalation Given 10/06/19 0255)  predniSONE (DELTASONE) tablet 60 mg (60 mg Oral Given 10/06/19 [id number])    ED Course  I have reviewed the triage vital signs and the nursing notes.  Pertinent labs & imaging results that were available during my care of the patient were reviewed by me and considered in my medical decision making (see chart for details).    MDM Rules/Calculators/A&P                        This patient presents to the ED for concern of shortness of breath, this involves an extensive number of treatment options, and is a complaint that carries with it a high risk of complications and morbidity.  The differential diagnosis includes COPD, CHF, pulmonary embolism, ACS, pneumonia   Lab Tests:   I Ordered, reviewed, and interpreted labs, which included BNP, metabolic panel, complete blood count  Medicines ordered:   I ordered medication Lasix pulmonary edema, albuterol/prednisone for wheezing  Imaging Studies ordered:   I ordered imaging studies which included chest x-ray   I independently visualized and interpreted imaging which showed pulmonary edema  Additional history obtained:   Previous records obtained and reviewed   Consultations Obtained:   I consulted [facility] hospitalist Dr. Darrick Meigs and discussed lab and imaging findings  Reevaluation:  After the interventions stated above, I reevaluated the patient and found patient is improving  Critical Interventions:  . Lasix and albuterol  3:47 AM Patient presents for shortness of breath for 3 months I suspect this is a combination of COPD and CHF.  Due to soft blood pressures, only Lasix has been given.  Patient is also given albuterol Discussed with Dr. Darrick Meigs for admission  Final Clinical Impression(s) / ED Diagnoses Final diagnoses:  COPD exacerbation ([place])  Acute systolic congestive heart failure [facility])    Rx / DC Orders ED Discharge Orders    None       Ripley Fraise, MD 10/06/19 [phone]

## 2019-10-06 NOTE — Progress Notes (Signed)
  Echocardiogram 2D Echocardiogram has been performed.  Darlina Sicilian M 10/06/2019, 9:41 AM

## 2019-10-06 NOTE — Consult Note (Addendum)
Cardiology Consultation:   Patient ID: [name]; [id number]; 10-17-1945   Admit date: 10/05/2019 Date of Consult: 10/06/2019  Primary Care Provider: Carolee Rota, NP per the office, pt has not seen primary care Primary Cardiologist: No primary care provider on file. Dr Johnsie Cancel saw 2015 Primary Electrophysiologist:  None   Patient Profile:   [name] is a 74 y.o. female with a hx of BMS RCA 2010, L CEA 2010, COPD, HTN, HLD, tob use, breast CA, who is being seen today for the evaluation of SOB at the request of Dr Darrick Meigs.  History of Present Illness:   [name] says she developed LE edema about 4 days ago. She has been SOB x 3 months.  Not sure about PND as she says she chronically does not sleep well. +orthopnea.    She denies CP, but has had worsening DOE. Still smokes.  Finally decided to come in for evaluation. Got Lasix last pm and is breathing better, but not back to baseline.   She has not had fevers.   She has had some wheezing. +cough, non-productive   Past Medical History:  Diagnosis Date  . Breast cancer ([place])   . Carotid artery stenosis   . COPD (chronic obstructive pulmonary disease) ([place])   . Coronary artery disease   . Depression   . Enlargement of lymph node   . GERD (gastroesophageal reflux disease)   . Hyperlipidemia   . Hypertension   . LBP (low back pain)   . Mitral regurgitation   . Osteoarthritis   . Sciatica   . Systolic congestive heart failure ([place])   . Tobacco user   . Urge urinary incontinence     Past Surgical History:  Procedure Laterality Date  . APPENDECTOMY     as a child  . BACK SURGERY  1995  . bare-metal stenting of critical mid ICA stenosis     w/ ulcerated plaque/thrombus  . CAROTID ENDARTERECTOMY    . [place]  . CHOLECYSTECTOMY  1980s  . MASTECTOMY  1992   L     Prior to Admission medications   Medication Sig Start Date End Date Taking? Authorizing Provider  albuterol  (PROVENTIL) (2.5 MG/3ML) 0.083% nebulizer solution Take 3 mLs (2.5 mg total) by nebulization every 6 (six) hours as needed for wheezing or shortness of breath. 09/01/13   Chesley Mires, MD  aspirin 81 MG tablet Take 81 mg by mouth daily.    [provider]  carvedilol (COREG) 6.25 MG tablet Take 6.25 mg by mouth 2 (two) times daily with a meal.      [provider]  clopidogrel (PLAVIX) 75 MG tablet Take 75 mg by mouth daily.      [provider]  furosemide (LASIX) 40 MG tablet Take 40 mg by mouth daily.      [provider]  hydrocodone-acetaminophen (LORCET-HD) 5-500 MG per capsule Take 1 capsule by mouth every 4 (four) hours as needed.      [provider]  lisinopril (PRINIVIL,ZESTRIL) 20 MG tablet Take 20 mg by mouth daily.      [provider]  potassium chloride (KLOR-CON) 20 MEQ packet Take 20 mEq by mouth daily.      [provider]  simvastatin (ZOCOR) 80 MG tablet Take 80 mg by mouth at bedtime.      [provider]  buPROPion (WELLBUTRIN XL) 150 MG 24 hr tablet Take 1 tablet (150 mg total) by mouth  daily. 01/26/11 08/28/11  Josue Hector, MD  fluticasone (FLONASE) 50 MCG/ACT nasal spray Place 2 sprays into the nose daily.    08/28/11  [provider]    Inpatient Medications: Scheduled Meds: . aspirin  81 mg Oral Daily  . atorvastatin  40 mg Oral Daily  . carvedilol  6.25 mg Oral BID WC  . clopidogrel  75 mg Oral Daily  . enoxaparin (LOVENOX) injection  40 mg Subcutaneous Q24H  . furosemide  20 mg Intravenous Q12H  . lisinopril  20 mg Oral Daily  . methylPREDNISolone (SOLU-MEDROL) injection  40 mg Intravenous Q6H  . sodium chloride flush  3 mL Intravenous Q12H   Continuous Infusions: . sodium chloride     PRN Meds: sodium chloride, albuterol, ondansetron **OR** ondansetron (ZOFRAN) IV, sodium chloride flush  Allergies:    Allergies  Allergen Reactions  . Gabapentin Swelling    Social  History:   Social History   Socioeconomic History  . Marital status: Married    Spouse name: [name]  . Number of children: Y  . Years of education: Not on file  . Highest education level: Not on file  Occupational History  . 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  Tobacco Use  . Smoking status: Current Every Day Smoker    Packs/day: 1.00    Years: 51.00    Pack years: 51.00    Types: Cigarettes  . Smokeless tobacco: Never Used  Substance and Sexual Activity  . Alcohol use: No  . Drug use: No  . Sexual activity: Not on file  Other Topics Concern  . Not on file  Social History Narrative   Lives with daughter, [name] and [name] and her husband, [name].   Social Determinants of Health   Financial Resource Strain:   . Difficulty of Paying Living Expenses:   Food Insecurity:   . Worried About Charity fundraiser in the Last Year:   . Arboriculturist in the Last Year:   Transportation Needs:   . Film/video editor (Medical):   Marland Kitchen Lack of Transportation (Non-Medical):   Physical Activity:   . Days of Exercise per Week:   . Minutes of Exercise per Session:   Stress:   . Feeling of Stress :   Social Connections:   . Frequency of Communication with Friends and Family:   . Frequency of Social Gatherings with Friends and Family:   . Attends Religious Services:   . Active Member of Clubs or Organizations:   . Attends Archivist Meetings:   Marland Kitchen Marital Status:   Intimate Partner Violence:   . Fear of Current or Ex-Partner:   . Emotionally Abused:   Marland Kitchen Physically Abused:   . Sexually Abused:     Family History:   Family History  Problem Relation Age of Onset  . Diabetes Mother   . Heart attack Mother        deceased at age 25  . Hypertension Mother   . Lung cancer Father        deceased at age 35  . Stroke Father   . Stroke Brother        deceased at age 52s  . Heart  disease Brother    Family Status:  Family Status  Relation Name Status  . Brother  Deceased  . Brother  Deceased  . Sister  Alive  . Mother  Deceased  . Father  Deceased  . Brother  (Not Specified)  . Brother  (Not Specified)    ROS:  Please see the history of present illness.  All other ROS reviewed and negative.     Physical Exam/Data:   Vitals:   10/06/19 0500 10/06/19 0530 10/06/19 0600 10/06/19 0630  BP: 111/68 (!) 100/57 110/65 112/63  Pulse: 67 61 71 68  Resp: (!) 23 (!) 21 (!) 21 (!) 23  Temp:      SpO2: 95% 91% 94% 91%  Weight:      Height:       No intake or output data in the 24 hours ending 10/06/19 0808  Last 3 Weights 10/06/2019 10/05/2019 04/20/2014  Weight (lbs) 144 lb 3.2 oz 140 lb 160 lb 12.8 oz  Weight (kg) 65.409 kg 63.504 kg 72.938 kg     Body mass index is 23.27 kg/m.   General:  Well nourished, well developed, female in no acute distress HEENT: normal Lymph: no adenopathy Neck: JVD - 9 cm Endocrine:  No thryomegaly Vascular: bilat carotid bruits; 4/4 extremity pulses 2+  Cardiac:  normal S1, S2; RRR; no murmur Lungs: rales bases bilaterally, + exp wheezing, no rhonchi   Abd: soft, nontender, no hepatomegaly  Ext: no edema Musculoskeletal:  No deformities, BUE and BLE strength normal and equal Skin: warm and dry  Neuro:  CNs 2-12 intact, no focal abnormalities noted Psych:  Normal affect   EKG:  The EKG was personally reviewed and demonstrates:   04/29 ECG is SR, HR 73 QRS duration 164 ms, LBBB, poor R wave progression. 2015 ECG described at non-specific intraventricular block w/ QRS duration 132 ms Telemetry:  Telemetry was personally reviewed and demonstrates:  SR   CV studies:   ECHO: ordered EF nl w/ grade 1 dd in 2011  CATH: 07/20/2008 severe single-vessel CAD with 99% occlusion of the  mid RCA, with evidence of ulcerated plaque and thrombus.  Dr. Burt Knack  proceeded was successful bare-metal stenting, with no noted   complications.    Laboratory Data:   Chemistry Recent Labs  Lab 10/06/19 0150  NA 135  K 3.7  CL 99  CO2 26  GLUCOSE 101*  BUN 9  CREATININE 0.89  CALCIUM 8.5*  GFRNONAA >60  GFRAA >60  ANIONGAP 10    Lab Results  Component Value Date   ALT 10 10/06/2019   AST 16 10/06/2019   ALKPHOS 56 10/06/2019   BILITOT 0.6 10/06/2019   Hematology Recent Labs  Lab 10/06/19 0150  WBC 7.6  RBC 4.10  HGB 12.2  HCT 39.0  MCV 95.1  MCH 29.8  MCHC 31.3  RDW 15.0  PLT 173   Cardiac Enzymes High Sensitivity Troponin:  No results for input(s): TROPONINIHS in the last 720 hours.    BNP Recent Labs  Lab 10/06/19 0150  BNP 2,715.0*   TSH:  Lab Results  Component Value Date   TSH 1.320 **Test methodology is 3rd generation TSH** 11/13/2009   Lipids: Lab Results  Component Value Date   CHOL  08/01/2007    166        ATP III CLASSIFICATION:  <200     mg/dL   Desirable  200-239  mg/dL   Borderline High  >=240    mg/dL   High   HDL 29 (L) 08/01/2007   LDLCALC (H) 08/01/2007    124        Total Cholesterol/HDL:CHD Risk Coronary Heart Disease Risk Table  Men   Women  1/2 Average Risk   3.4   3.3   LDLDIRECT 94 12/30/2007   TRIG 67 08/01/2007   CHOLHDL 5.7 08/01/2007   HgbA1c: Lab Results  Component Value Date   HGBA1C (H) 10/31/2009    5.7 (NOTE)                                                                       According to the ADA Clinical Practice Recommendations for 2011, when HbA1c is used as a screening test:   >=6.5%   Diagnostic of Diabetes Mellitus           (if abnormal result  is confirmed)  5.7-6.4%   Increased risk of developing Diabetes Mellitus  References:Diagnosis and Classification of Diabetes Mellitus,Diabetes [id number] 1):S62-S69 and Standards of Medical Care in         Diabetes - 2011,Diabetes Care,2011,34  (Suppl 1):S11-S61.   Magnesium:  Magnesium  Date Value Ref Range Status  11/17/2009 2.4 1.5 - 2.5 mg/dL  Final     Radiology/Studies:  [facility] Chest Port 1 View  Result Date: 10/06/2019 CLINICAL DATA:  Shortness of breath EXAM: PORTABLE CHEST 1 VIEW COMPARISON:  April 28, 2014 FINDINGS: There is moderate cardiomegaly. Aortic knob calcifications. Prominence of the pulmonary vasculature with diffuse interstitial markings throughout. No large airspace consolidation. A trace right pleural effusion is present. No acute osseous abnormality. IMPRESSION: Cardiomegaly and interstitial edema. Small right pleural effusion. Electronically Signed   By: Prudencio Pair M.D.   On: 10/06/2019 01:19    Assessment and Plan:   1. Acute on chronic diastolic CHF - problems w/ med compliance in the past, need to clarify  - according to Care Everywhere, pt was on Lasix 20 mg, 2 tabs daily, Kdur 20 meq daily, Coreg 6.25 mg bid and Plavix 75 mg qd.  - However all these Rx's were 3 months supply, given w/out refills in November 2020 - she may just have run out of her meds - agree w/ continuing IV Lasix, may be able to change to po rx in 24-48 hr - she was supposed to be on 40 mg qd po Lasix, will increased IV to 40 mg bid since BNP is quite high - BNP this high is concerning for decreased EF - f/u on echo, EF was nl in 2011  2. CAD - S/P BMS RCA 2010 - pt had rx for Plavix, is on ASA as well - MD advise if she needs both, as long as EF nl and no WMA on echo - no ischemic sx reported, troponin not checked - if EF is down, may need cath - ck lipids and A1c  Active Problems:   COPD exacerbation ([place])     For questions or updates, please contact [place] Please consult [email or web address] for contact info under Cardiology/STEMI.   SignedRosaria Ferries, PA-C  10/06/2019 8:08 AM   Attending note Patient seen and discussed with PA Barrett, I agree with her documentation. 74 yo female history of COPD, CAD with prior BMS to RCA in 07/2008, left CEA, tobacco abuse admitted with SOB and LE edema   WBC 7.6 Hgb 12.2  Plt 173 K 3.7 Cr 0.89 BNP 2715  COVID neg CXR cardiomegaly,  edema Echo pending. Prelim LVEF 20% EKG SR, LBBB   Acute systolic HF, prelim read on echo LVEF 20%. EKG LBBB raising concern for underlying CAD, she also has prior history of CAD with stent. Medical therapy with coreg 6.25mg  bid, lisinopril 20. On lasix 40mg  bid, just got first dose very early this AM, follow diuresis. STop lisinopril after 48 hrs start entresto 24/26mg  bid. Spoke with nurse, has not gotten lisinopril today, will d/c, Would be able to start entresto 24/26mg  bid on Sunday.  Start aldactone 12.5mg  daily  COPD exacerbation, management per primary team   Would work to diurese her and optimize COPD management over the weekend, would plan for RHC/LHC early next week.  Discussed with primary team, to arrange transfer to [facility] to medicine service, cardiology to follow. Diuresis over the weekend, potentially LHC/RHC on Monday   Carlyle Dolly MD

## 2019-10-06 NOTE — Progress Notes (Signed)
PROLONGED SERVICE CARE NOTE   10/06/2019 1:32 PM  [name] was seen and examined.  The H&P by the admitting provider, orders, imaging was reviewed.  I spoke with cardiologist Dr. Harl Bowie who is reading her echocardiogram and he reports that the patient's EF is markedly reduced at about 20%.  He is recommending that patient be diuresed with IV Lasix over the next several days and likely to have a cardiac catheterization on Monday or Tuesday of next week.  He would like for the patient to transfer to [facility] so that she can be properly diuresed and prepared for the catheterization.  Transfer orders placed.  Patient will remain on [facility] service.  I made several attempts to contact patient's daughter at the number provided but there was no answer and unable to leave a message.  Please see new orders.  Will continue to follow.   Vitals:   10/06/19 0600 10/06/19 0630  BP: 110/65 112/63  Pulse: 71 68  Resp: (!) 21 (!) 23  Temp:    SpO2: 94% 91%    Results for orders placed or performed during the hospital encounter of 10/05/19  Respiratory Panel by RT PCR (Flu A&B, Covid) - Nasopharyngeal Swab   Specimen: Nasopharyngeal Swab  Result Value Ref Range   SARS Coronavirus 2 by RT PCR NEGATIVE NEGATIVE   Influenza A by PCR NEGATIVE NEGATIVE   Influenza B by PCR NEGATIVE NEGATIVE  CBC with Differential/Platelet  Result Value Ref Range   WBC 7.6 4.0 - 10.5 K/uL   RBC 4.10 3.87 - 5.11 MIL/uL   Hemoglobin 12.2 12.0 - 15.0 g/dL   HCT 39.0 36.0 - 46.0 %   MCV 95.1 80.0 - 100.0 fL   MCH 29.8 26.0 - 34.0 pg   MCHC 31.3 30.0 - 36.0 g/dL   RDW 15.0 11.5 - 15.5 %   Platelets 173 150 - 400 K/uL   nRBC 0.0 0.0 - 0.2 %   Neutrophils Relative % 67 %   Neutro Abs 5.1 1.7 - 7.7 K/uL   Lymphocytes Relative 24 %   Lymphs Abs 1.8 0.7 - 4.0 K/uL   Monocytes Relative 7 %   Monocytes Absolute 0.6 0.1 - 1.0 K/uL   Eosinophils Relative 1 %   Eosinophils Absolute 0.1 0.0 - 0.5 K/uL   Basophils  Relative 1 %   Basophils Absolute 0.0 0.0 - 0.1 K/uL   Immature Granulocytes 0 %   Abs Immature Granulocytes 0.01 0.00 - 0.07 K/uL  Comprehensive metabolic panel  Result Value Ref Range   Sodium 135 135 - 145 mmol/L   Potassium 3.7 3.5 - 5.1 mmol/L   Chloride 99 98 - 111 mmol/L   CO2 26 22 - 32 mmol/L   Glucose, Bld 101 (H) 70 - 99 mg/dL   BUN 9 8 - 23 mg/dL   Creatinine, Ser 0.89 0.44 - 1.00 mg/dL   Calcium 8.5 (L) 8.9 - 10.3 mg/dL   Total Protein 6.8 6.5 - 8.1 g/dL   Albumin 3.6 3.5 - 5.0 g/dL   AST 16 15 - 41 U/L   ALT 10 0 - 44 U/L   Alkaline Phosphatase 56 38 - 126 U/L   Total Bilirubin 0.6 0.3 - 1.2 mg/dL   GFR calc non Af Amer >60 >60 mL/min   GFR calc Af Amer >60 >60 mL/min   Anion gap 10 5 - 15  Brain natriuretic peptide  Result Value Ref Range   B Natriuretic Peptide 2,715.0 (H)  0.0 - 100.0 pg/mL  Lipid panel  Result Value Ref Range   Cholesterol 106 0 - 200 mg/dL   Triglycerides 89 <150 mg/dL   HDL 32 (L) >40 mg/dL   Total CHOL/HDL Ratio 3.3 RATIO   VLDL 18 0 - 40 mg/dL   LDL Cholesterol 56 0 - 99 mg/dL  ECHOCARDIOGRAM COMPLETE  Result Value Ref Range   Weight 2,307.2 oz   Height 66 in   BP 112/63 mmHg   Time spent: 36 mins  Murvin Natal, MD [facility]   10/05/2019 11:31 PM How to contact the [facility] Attending or Consulting provider [place] or covering provider during after hours [place], for this patient?  1. Check the care team in [facility] and look for a) attending/consulting [facility] provider listed and b) the [facility] team listed 2. Log into [email or web address] and use [place]'s universal password to access. If you do not have the password, please contact the hospital operator. 3. Locate the [facility] provider you are looking for under [facility] and page to a number that you can be directly reached. 4. If you still have difficulty reaching the provider, please page the [facility] (Director on Call) for the Hospitalists listed on amion for assistance.

## 2019-10-07 DIAGNOSIS — I5021 Acute systolic (congestive) heart failure: Secondary | ICD-10-CM | POA: Diagnosis not present

## 2019-10-07 DIAGNOSIS — I5032 Chronic diastolic (congestive) heart failure: Secondary | ICD-10-CM

## 2019-10-07 DIAGNOSIS — E785 Hyperlipidemia, unspecified: Secondary | ICD-10-CM

## 2019-10-07 DIAGNOSIS — Z720 Tobacco use: Secondary | ICD-10-CM

## 2019-10-07 DIAGNOSIS — R0989 Other specified symptoms and signs involving the circulatory and respiratory systems: Secondary | ICD-10-CM

## 2019-10-07 DIAGNOSIS — I10 Essential (primary) hypertension: Secondary | ICD-10-CM

## 2019-10-07 DIAGNOSIS — J449 Chronic obstructive pulmonary disease, unspecified: Secondary | ICD-10-CM | POA: Diagnosis not present

## 2019-10-07 LAB — COMPREHENSIVE METABOLIC PANEL
ALT: 11 U/L (ref 0–44)
AST: 14 U/L — ABNORMAL LOW (ref 15–41)
Albumin: 3.3 g/dL — ABNORMAL LOW (ref 3.5–5.0)
Alkaline Phosphatase: 49 U/L (ref 38–126)
Anion gap: 10 (ref 5–15)
BUN: 18 mg/dL (ref 8–23)
CO2: 31 mmol/L (ref 22–32)
Calcium: 8.7 mg/dL — ABNORMAL LOW (ref 8.9–10.3)
Chloride: 99 mmol/L (ref 98–111)
Creatinine, Ser: 1 mg/dL (ref 0.44–1.00)
GFR calc Af Amer: >60 mL/min (ref >60)
GFR calc non Af Amer: 55 mL/min — ABNORMAL LOW (ref >60)
Glucose, Bld: 132 mg/dL — ABNORMAL HIGH (ref 70–99)
Potassium: 3.4 mmol/L — ABNORMAL LOW (ref 3.5–5.1)
Sodium: 140 mmol/L (ref 135–145)
Total Bilirubin: 0.4 mg/dL (ref 0.3–1.2)
Total Protein: 6.2 g/dL — ABNORMAL LOW (ref 6.5–8.1)

## 2019-10-07 LAB — CBC
HCT: 36.6 % (ref 36.0–46.0)
Hemoglobin: 11.4 g/dL — ABNORMAL LOW (ref 12.0–15.0)
MCH: 29 pg (ref 26.0–34.0)
MCHC: 31.1 g/dL (ref 30.0–36.0)
MCV: 93.1 fL (ref 80.0–100.0)
Platelets: 188 10*3/uL (ref 150–400)
RBC: 3.93 MIL/uL (ref 3.87–5.11)
RDW: 14.9 % (ref 11.5–15.5)
WBC: 12.3 10*3/uL — ABNORMAL HIGH (ref 4.0–10.5)
nRBC: 0 % (ref 0.0–0.2)

## 2019-10-07 LAB — MAGNESIUM: Magnesium: 2.1 mg/dL (ref 1.7–2.4)

## 2019-10-07 LAB — LIPID PANEL
Cholesterol: 107 mg/dL (ref 0–200)
HDL: 32 mg/dL — ABNORMAL LOW (ref >40)
LDL Cholesterol: 60 mg/dL (ref 0–99)
Total CHOL/HDL Ratio: 3.3 RATIO
Triglycerides: 74 mg/dL (ref <150)
VLDL: 15 mg/dL (ref 0–40)

## 2019-10-07 LAB — BRAIN NATRIURETIC PEPTIDE: B Natriuretic Peptide: 3321 pg/mL — ABNORMAL HIGH (ref 0.0–100.0)

## 2019-10-07 MED ORDER — PANTOPRAZOLE SODIUM 40 MG PO TBEC
40.0000 mg | DELAYED_RELEASE_TABLET | Freq: Every day | ORAL | Status: DC
  Administered 2019-10-07 – 2019-10-10 (×4): 40 mg via ORAL
  Filled 2019-10-07 (×4): qty 1

## 2019-10-07 MED ORDER — POTASSIUM CHLORIDE CRYS ER 20 MEQ PO TBCR
40.0000 meq | EXTENDED_RELEASE_TABLET | Freq: Every day | ORAL | Status: DC
  Administered 2019-10-07 – 2019-10-08 (×2): 40 meq via ORAL
  Filled 2019-10-07 (×2): qty 2

## 2019-10-07 NOTE — Progress Notes (Signed)
PROGRESS NOTE   [name]  [id number] DOB: January 05, 1946 DOA: 10/05/2019 PCP: Carolee Rota, NP   Chief Complaint  Patient presents with  . Shortness of Breath    Brief Narrative:  74 y.o. female, with history of hypertension, hyperlipidemia, GERD, chronic diastolic CHF, COPD, tobacco use came to hospital with complaints of shortness of breath.  Patient stated shortness of breath has been going on for past several months.  She stated she felt worsening of shortness of breath so she came to hospital.  She has been long lost to medical follow up. She continues to smoke cigarettes.  Echo done this admission reveals a new cardiomyopathy EF 20%.  She is being diuresed and then transferred to [facility] for cardiac cath early next week.   Assessment & Plan:   Active Problems:   Hyperlipidemia   Tobacco abuse   Hypertension   Coronary atherosclerosis   Chronic diastolic heart failure (HCC)   Bilateral carotid bruits   COPD with chronic bronchitis (HCC)   GERD   COPD exacerbation (HCC)   Acute systolic congestive heart failure (HCC)   COPD (chronic obstructive pulmonary disease) (HCC)   Acute systolic CHF (congestive heart failure) (HCC)   1. HFrEF - this is a new finding of cardiomyopathy and the [facility] team is concerned about cardiac ischemia and requested patient be transferred to [facility] for cardiac cath. She is being diuresed with IV lasix and responding well to treatments.  Continue to monitor weights, I/Os and electrolytes.  Cardiology planning to start entresto tomorrow and has started aldactone 12.5 mg daily.  2. Mild COPD exacerbation - suspect underlying cardiac asthma given severe CHF.  She is responding well to diuresis, weaning down steroids. Continue bronchodilators.  3. GERD - protonix ordered for GI protection.  4. Tobacco - Pt strongly advised to stop and ordered a nicotine patch.  5. Hypertension - well controlled. Planning to start Entresto on 10/08/19.  6. Hypokalemia -  oral potassium ordered, following BMP.    DVT prophylaxis: lovenox Code Status: Full  Family Communication: attempted x 2 to reach daughter Helene Kelp, no answer, no message recorder Disposition:   Status is: Inpatient  Remains inpatient appropriate because:IV treatments appropriate due to intensity of illness or inability to take PO   Dispo: The patient is from: Home              Anticipated d/c is to: Home              Anticipated d/c date is: 3 days              Patient currently is not medically stable to d/c.        Consultants:   Cardiology   Procedures:   2D Echocardiogram 10/06/19  Antimicrobials:     Subjective: Pt denies Chest pain but continues to have SOB worse with ambulation, she is urinating frequently.   Objective: Vitals:   10/07/19 0239 10/07/19 0242 10/07/19 0512 10/07/19 1227  BP: 116/67  (!) 111/57 (!) 102/48  Pulse: 85  (!) 58 (!) 59  Resp: 18  20   Temp: 98 F (36.7 C)  98.2 F (36.8 C)   TempSrc: Oral  Oral   SpO2: 94% 94% 95% 96%  Weight:      Height:       No intake or output data in the 24 hours ending 10/07/19 1451 Filed Weights   10/05/19 2342 10/06/19 0300  Weight: 63.5 kg 65.4 kg    Examination:  General exam: Appears calm and comfortable  Respiratory system: bibasilar crackles. Respiratory effort normal. Cardiovascular system: normal S1 & S2 heard. No JVD, murmurs, rubs, gallops or clicks. 2+ pedal edema. Gastrointestinal system: Abdomen is nondistended, soft and nontender. No organomegaly or masses felt. Normal bowel sounds heard. Central nervous system: Alert and oriented. No focal neurological deficits. Extremities: 1+ pretibial edema BLEs. Symmetric 5 x 5 power. Skin: No rashes, lesions or ulcers Psychiatry: Judgement and insight appear normal. FLAT AFFECT.    Data Reviewed: I have personally reviewed following labs and imaging studies  CBC: Recent Labs  Lab 10/06/19 0150 10/07/19 0719  WBC 7.6 12.3*    NEUTROABS 5.1  --   HGB 12.2 11.4*  HCT 39.0 36.6  MCV 95.1 93.1  PLT 173 0000000    Basic Metabolic Panel: Recent Labs  Lab 10/06/19 0150 10/07/19 0719  NA 135 140  K 3.7 3.4*  CL 99 99  CO2 26 31  GLUCOSE 101* 132*  BUN 9 18  CREATININE 0.89 1.00  CALCIUM 8.5* 8.7*  MG  --  2.1    GFR: Estimated Creatinine Clearance: 44.8 mL/min (by C-G formula based on SCr of 1 mg/dL).  Liver Function Tests: Recent Labs  Lab 10/06/19 0150 10/07/19 0719  AST 16 14*  ALT 10 11  ALKPHOS 56 49  BILITOT 0.6 0.4  PROT 6.8 6.2*  ALBUMIN 3.6 3.3*    CBG: No results for input(s): GLUCAP in the last 168 hours.   Recent Results (from the past 240 hour(s))  Respiratory Panel by RT PCR (Flu A&B, Covid) - Nasopharyngeal Swab     Status: None   Collection Time: 10/06/19  1:26 AM   Specimen: Nasopharyngeal Swab  Result Value Ref Range Status   SARS Coronavirus 2 by RT PCR NEGATIVE NEGATIVE Final    Comment: (NOTE) SARS-CoV-2 target nucleic acids are NOT DETECTED. The SARS-CoV-2 RNA is generally detectable in upper respiratoy specimens during the acute phase of infection. The lowest concentration of SARS-CoV-2 viral copies this assay can detect is 131 copies/mL. A negative result does not preclude SARS-Cov-2 infection and should not be used as the sole basis for treatment or other patient management decisions. A negative result may occur with  improper specimen collection/handling, submission of specimen other than nasopharyngeal swab, presence of viral mutation(s) within the areas targeted by this assay, and inadequate number of viral copies (<131 copies/mL). A negative result must be combined with clinical observations, patient history, and epidemiological information. The expected result is Negative. Fact Sheet for Patients:  PinkCheek.be Fact Sheet for Healthcare Providers:  GravelBags.it This test is not yet ap proved or  cleared by the [place] FDA and  has been authorized for detection and/or diagnosis of SARS-CoV-2 by FDA under an Emergency Use Authorization (EUA). This EUA will remain  in effect (meaning this test can be used) for the duration of the COVID-19 declaration under Section 564(b)(1) of the Act, 21 U.S.C. section 360bbb-3(b)(1), unless the authorization is terminated or revoked sooner.    Influenza A by PCR NEGATIVE NEGATIVE Final   Influenza B by PCR NEGATIVE NEGATIVE Final    Comment: (NOTE) The Xpert Xpress SARS-CoV-2/FLU/RSV assay is intended as an aid in  the diagnosis of influenza from Nasopharyngeal swab specimens and  should not be used as a sole basis for treatment. Nasal washings and  aspirates are unacceptable for Xpert Xpress SARS-CoV-2/FLU/RSV  testing. Fact Sheet for Patients: PinkCheek.be Fact Sheet for Healthcare Providers: GravelBags.it This test is  not yet approved or cleared by the [place] and  has been authorized for detection and/or diagnosis of SARS-CoV-2 by  FDA under an Emergency Use Authorization (EUA). This EUA will remain  in effect (meaning this test can be used) for the duration of the  Covid-19 declaration under Section 564(b)(1) of the Act, 21  U.S.C. section 360bbb-3(b)(1), unless the authorization is  terminated or revoked. Performed at [facility], [address] [place], [place] [address]          Radiology Studies: [facility] Chest [facility] 1 View  Result Date: 10/06/2019 CLINICAL DATA:  Shortness of breath EXAM: PORTABLE CHEST 1 VIEW COMPARISON:  April 28, 2014 FINDINGS: There is moderate cardiomegaly. Aortic knob calcifications. Prominence of the pulmonary vasculature with diffuse interstitial markings throughout. No large airspace consolidation. A trace right pleural effusion is present. No acute osseous abnormality. IMPRESSION: Cardiomegaly and interstitial edema. Small right  pleural effusion. Electronically Signed   By: Prudencio Pair M.D.   On: 10/06/2019 01:19   ECHOCARDIOGRAM COMPLETE  Result Date: 10/06/2019    ECHOCARDIOGRAM REPORT   Patient Name:   [name] Date of Exam: 10/06/2019 Medical Rec #:  [id number]        Height:       66.0 in Accession #:    [id number]       Weight:       144.2 lb Date of Birth:  06-08-1946        BSA:          1.740 m Patient Age:    43 years         BP:           112/63 mmHg Patient Gender: F                HR:           68 bpm. Exam Location:  Inpatient Procedure: 2D Echo Indications:    Congestive Heart Failure 428.0 / I50.9  History:        Patient has no prior history of Echocardiogram examinations.                 CHF, COPD, Signs/Symptoms:Shortness of Breath; Risk                 Factors:Hypertension, Dyslipidemia and Current Smoker.  Sonographer:    Darlina Sicilian RDCS Referring Phys: [place]  1. Left ventricular ejection fraction, by estimation, is 20%. The left ventricle has severely decreased function. The left ventricle demonstrates global hypokinesis. There is moderate left ventricular hypertrophy. Left ventricular diastolic parameters are indeterminate. Elevated left atrial pressure.  2. Right ventricular systolic function is mildly reduced. The right ventricular size is mildly enlarged. There is mildly elevated pulmonary artery systolic pressure.  3. Left atrial size was severely dilated.  4. Right atrial size was severely dilated.  5. Functional MR due to tethering of the posterior leaflet in setting of LV systolic dysfunction. . The mitral valve is abnormal. Moderate to severe mitral valve regurgitation.  6. The aortic valve was not well visualized. Aortic valve regurgitation is mild to moderate. No aortic stenosis is present.  7. The inferior vena cava is normal in size with greater than 50% respiratory variability, suggesting right atrial pressure of 3 mmHg. FINDINGS  Left Ventricle: Left  ventricular ejection fraction, by estimation, is 20%. The left ventricle has severely decreased function. The left ventricle demonstrates global hypokinesis. The left ventricular internal cavity  size was normal in size. There is moderate left ventricular hypertrophy. Left ventricular diastolic parameters are indeterminate. Elevated left atrial pressure. Right Ventricle: The right ventricular size is mildly enlarged. No increase in right ventricular wall thickness. Right ventricular systolic function is mildly reduced. There is mildly elevated pulmonary artery systolic pressure. The tricuspid regurgitant  velocity is 2.76 m/s, and with an assumed right atrial pressure of 8 mmHg, the estimated right ventricular systolic pressure is [id number] mmHg. Left Atrium: Left atrial size was severely dilated. Right Atrium: Right atrial size was severely dilated. Pericardium: There is no evidence of pericardial effusion. Mitral Valve: Functional MR due to tethering of the posterior leaflet in setting of LV systolic dysfunction. The mitral valve is abnormal. Moderate to severe mitral valve regurgitation. Tricuspid Valve: The tricuspid valve is normal in structure. Tricuspid valve regurgitation is mild . No evidence of tricuspid stenosis. Aortic Valve: The aortic valve was not well visualized. Aortic valve regurgitation is mild to moderate. Aortic regurgitation PHT measures 415 msec. No aortic stenosis is present. Aortic valve mean gradient measures 2.8 mmHg. Aortic valve peak gradient measures 7.1 mmHg. Pulmonic Valve: The pulmonic valve was not well visualized. Pulmonic valve regurgitation is not visualized. No evidence of pulmonic stenosis. Aorta: The aortic root is normal in size and structure. Pulmonary Artery: Indeterminant PASP, inadequate TR jet. Venous: The inferior vena cava is normal in size with greater than 50% respiratory variability, suggesting right atrial pressure of 3 mmHg. IAS/Shunts: No atrial level shunt detected by  color flow Doppler.  LEFT VENTRICLE PLAX 2D LVIDd:         6.00 cm      Diastology LVIDs:         5.62 cm      LV e' lateral:   3.37 cm/s LV PW:         1.35 cm      LV E/e' lateral: 22.3 LV IVS:        1.22 cm      LV e' medial:    2.44 cm/s                             LV E/e' medial:  30.8  LV Volumes (MOD) LV vol d, MOD A2C: 261.0 ml LV vol d, MOD A4C: 178.0 ml LV vol s, MOD A2C: 181.0 ml LV vol s, MOD A4C: 140.0 ml LV SV MOD A2C:     80.0 ml LV SV MOD A4C:     178.0 ml LV SV MOD BP:      55.3 ml RIGHT VENTRICLE TAPSE (M-mode): 1.5 cm LEFT ATRIUM             Index       RIGHT ATRIUM           Index LA diam:        4.80 cm 2.76 cm/m  RA Area:     23.90 cm LA Vol (A2C):   91.7 ml 52.69 ml/m RA Volume:   81.10 ml  46.60 ml/m LA Vol (A4C):   62.4 ml 35.86 ml/m LA Biplane Vol: 74.6 ml 42.87 ml/m  AORTIC VALVE AV Vmax:           132.91 cm/s AV Vmean:          76.730 cm/s AV VTI:            0.240 m AV Peak Grad:      7.1 mmHg AV  Mean Grad:      2.8 mmHg LVOT Vmax:         93.80 cm/s LVOT Vmean:        57.900 cm/s LVOT VTI:          0.166 m LVOT/AV VTI ratio: 0.69 AI PHT:            415 msec  AORTA Ao Root diam: 3.20 cm MITRAL VALVE                 TRICUSPID VALVE MV Area (PHT): 6.04 cm      TR Peak grad:   30.5 mmHg MV Decel Time: 126 msec      TR Vmax:        276.00 cm/s MR Peak grad:    86.5 mmHg MR Mean grad:    56.0 mmHg   SHUNTS MR Vmax:         465.00 cm/s Systemic VTI: 0.17 m MR Vmean:        357.0 cm/s MR PISA:         0.57 cm MR PISA Eff ROA: 5 mm MR PISA Radius:  0.30 cm MV E velocity: 75.15 cm/s MV A velocity: 81.95 cm/s MV E/A ratio:  0.92 Carlyle Dolly MD Electronically signed by Carlyle Dolly MD Signature Date/Time: 10/06/2019/3:10:42 PM    Final    Scheduled Meds: . aspirin  81 mg Oral Daily  . atorvastatin  40 mg Oral Daily  . carvedilol  6.25 mg Oral BID WC  . clopidogrel  75 mg Oral Daily  . enoxaparin (LOVENOX) injection  40 mg Subcutaneous Q24H  . furosemide  40 mg Intravenous BID   . methylPREDNISolone (SOLU-MEDROL) injection  40 mg Intravenous Q24H  . nicotine  21 mg Transdermal Daily  . potassium chloride  40 mEq Oral Daily  . sodium chloride flush  3 mL Intravenous Q12H  . spironolactone  12.5 mg Oral Daily   Continuous Infusions: . sodium chloride       LOS: 1 day    Time spent: 23 mins  Carliss Quast Wynetta Emery, MD [facility]   To contact the attending provider between 7A-7P or the covering provider during after hours 7P-7A, please log into the web site [email or web address] and access using universal [place] password for that web site. If you do not have the password, please call the hospital operator.  10/07/2019, 2:51 PM

## 2019-10-08 DIAGNOSIS — R0989 Other specified symptoms and signs involving the circulatory and respiratory systems: Secondary | ICD-10-CM | POA: Diagnosis not present

## 2019-10-08 DIAGNOSIS — I5021 Acute systolic (congestive) heart failure: Secondary | ICD-10-CM | POA: Diagnosis not present

## 2019-10-08 DIAGNOSIS — I5032 Chronic diastolic (congestive) heart failure: Secondary | ICD-10-CM | POA: Diagnosis not present

## 2019-10-08 DIAGNOSIS — J449 Chronic obstructive pulmonary disease, unspecified: Secondary | ICD-10-CM | POA: Diagnosis not present

## 2019-10-08 LAB — CBC
HCT: 33.6 % — ABNORMAL LOW (ref 36.0–46.0)
Hemoglobin: 10.6 g/dL — ABNORMAL LOW (ref 12.0–15.0)
MCH: 29.9 pg (ref 26.0–34.0)
MCHC: 31.5 g/dL (ref 30.0–36.0)
MCV: 94.6 fL (ref 80.0–100.0)
Platelets: 172 10*3/uL (ref 150–400)
RBC: 3.55 MIL/uL — ABNORMAL LOW (ref 3.87–5.11)
RDW: 15.1 % (ref 11.5–15.5)
WBC: 10.5 10*3/uL (ref 4.0–10.5)
nRBC: 0 % (ref 0.0–0.2)

## 2019-10-08 LAB — BASIC METABOLIC PANEL
Anion gap: 9 (ref 5–15)
BUN: 23 mg/dL (ref 8–23)
CO2: 30 mmol/L (ref 22–32)
Calcium: 8.4 mg/dL — ABNORMAL LOW (ref 8.9–10.3)
Chloride: 101 mmol/L (ref 98–111)
Creatinine, Ser: 0.88 mg/dL (ref 0.44–1.00)
GFR calc Af Amer: >60 mL/min (ref >60)
GFR calc non Af Amer: >60 mL/min (ref >60)
Glucose, Bld: 118 mg/dL — ABNORMAL HIGH (ref 70–99)
Potassium: 3.4 mmol/L — ABNORMAL LOW (ref 3.5–5.1)
Sodium: 140 mmol/L (ref 135–145)

## 2019-10-08 LAB — MAGNESIUM: Magnesium: 2.2 mg/dL (ref 1.7–2.4)

## 2019-10-08 LAB — BRAIN NATRIURETIC PEPTIDE: B Natriuretic Peptide: 1591 pg/mL — ABNORMAL HIGH (ref 0.0–100.0)

## 2019-10-08 MED ORDER — POTASSIUM CHLORIDE CRYS ER 20 MEQ PO TBCR
40.0000 meq | EXTENDED_RELEASE_TABLET | Freq: Every day | ORAL | Status: DC
  Administered 2019-10-09: 40 meq via ORAL
  Filled 2019-10-08: qty 2

## 2019-10-08 MED ORDER — FUROSEMIDE 40 MG PO TABS
40.0000 mg | ORAL_TABLET | Freq: Every day | ORAL | Status: DC
  Filled 2019-10-08: qty 1

## 2019-10-08 MED ORDER — SACUBITRIL-VALSARTAN 24-26 MG PO TABS
1.0000 | ORAL_TABLET | Freq: Two times a day (BID) | ORAL | Status: DC
  Administered 2019-10-08 (×2): 1 via ORAL
  Filled 2019-10-08 (×3): qty 1

## 2019-10-08 MED ORDER — POTASSIUM CHLORIDE CRYS ER 20 MEQ PO TBCR: 40.0000 meq | EXTENDED_RELEASE_TABLET | Freq: Two times a day (BID) | ORAL | Status: DC

## 2019-10-08 NOTE — Progress Notes (Signed)
PROGRESS NOTE   [name]  [id number] DOB: 1945/07/10 DOA: 10/05/2019 PCP: Carolee Rota, NP   Chief Complaint  Patient presents with  . Shortness of Breath    Brief Narrative:  74 y.o. female, with history of hypertension, hyperlipidemia, GERD, chronic diastolic CHF, COPD, tobacco use came to hospital with complaints of shortness of breath.  Patient stated shortness of breath has been going on for past several months.  She stated she felt worsening of shortness of breath so she came to hospital.  She has been long lost to medical follow up. She continues to smoke cigarettes.  Echo done this admission reveals a new cardiomyopathy EF 20%.  She is being diuresed and then transferred to [facility] for cardiac cath early next week.   Assessment & Plan:   Active Problems:   Hyperlipidemia   Tobacco abuse   Hypertension   Coronary atherosclerosis   Chronic diastolic heart failure (HCC)   Bilateral carotid bruits   COPD with chronic bronchitis (HCC)   GERD   COPD exacerbation (HCC)   Acute systolic congestive heart failure (HCC)   COPD (chronic obstructive pulmonary disease) (HCC)   Acute systolic CHF (congestive heart failure) (HCC)   1. HFrEF - this is a new finding of cardiomyopathy and the [facility] team is concerned about cardiac ischemia and requested patient be transferred to [facility] for cardiac cath. She is being diuresed with IV lasix and responding well to treatments.  Change to oral lasix 40 mg daily her home dose.  Continue to monitor weights, I/Os and electrolytes.  Cardiology recommending to start entresto 24/26 BID today.  It would have been more than 36 hours since she has taken lisinopril.  Pt has started aldactone 12.5 mg daily and coreg 6.25 mg BID.  2. Mild COPD exacerbation - suspect underlying cardiac asthma given severe CHF.  She is responding well to diuresis, weaning down steroids to complete course today. Continue bronchodilators.  3. GERD - protonix ordered for GI  protection.  4. Tobacco - Pt strongly advised to stop and ordered a nicotine patch.  5. Hypertension - well controlled.   6. Hypokalemia - oral potassium ordered, following BMP.    DVT prophylaxis: lovenox Code Status: Full  Family Communication: daughter [name] at bedside updated, her correct number is [phone] Disposition:   Status is: Inpatient  Remains inpatient appropriate because:IV treatments appropriate due to intensity of illness or inability to take PO   Dispo: The patient is from: Home              Anticipated d/c is to: Home              Anticipated d/c date is: 3 days              Patient currently is not medically stable to d/c.   Consultants:   Cardiology   Procedures:  2D Echocardiogram 10/06/19 IMPRESSIONS  1. Left ventricular ejection fraction, by estimation, is 20%. The left ventricle has severely decreased function. The left ventricle demonstrates global hypokinesis. There is moderate left ventricular hypertrophy. Left ventricular diastolic parameters  are indeterminate. Elevated left atrial pressure.  2. Right ventricular systolic function is mildly reduced. The right ventricular size is mildly enlarged. There is mildly elevated pulmonary artery systolic pressure.  3. Left atrial size was severely dilated.  4. Right atrial size was severely dilated.  5. Functional MR due to tethering of the posterior leaflet in setting of LV systolic dysfunction. . The mitral valve is  abnormal. Moderate to severe mitral valve regurgitation.  6. The aortic valve was not well visualized. Aortic valve regurgitation is mild to moderate. No aortic stenosis is present.  7. The inferior vena cava is normal in size with greater than 50% respiratory variability, suggesting right atrial pressure of 3 mmHg.   Antimicrobials:     Subjective: Pt reports that the edema in the legs is nearly completely resolved. No SOB. No CP.    Objective: Vitals:   10/07/19 1227  10/07/19 2012 10/07/19 2102 10/08/19 0501  BP: (!) 102/48  (!) 102/56 112/62  Pulse: (!) 59  67 76  Resp:   19 18  Temp:   99 F (37.2 C) 98 F (36.7 C)  TempSrc:   Oral Oral  SpO2: 96% 94% 96% 96%  Weight:      Height:        Intake/Output Summary (Last 24 hours) at 10/08/2019 1158 Last data filed at 10/07/2019 1700 Gross per 24 hour  Intake 240 ml  Output --  Net 240 ml   Filed Weights   10/05/19 2342 10/06/19 0300  Weight: 63.5 kg 65.4 kg    Examination:  General exam: Appears calm and comfortable  Respiratory system: clear to auscultation. Respiratory effort normal. Cardiovascular system: normal S1 & S2 heard. No JVD, murmurs, rubs, gallops or clicks. trace pedal edema. Gastrointestinal system: Abdomen is nondistended, soft and nontender. No organomegaly or masses felt. Normal bowel sounds heard. Central nervous system: Alert and oriented. No focal neurological deficits. Extremities: trace pretibial edema BLEs. Symmetric 5 x 5 power. Skin: No rashes, lesions or ulcers Psychiatry: Judgement and insight appear normal. FLAT AFFECT.    Data Reviewed: I have personally reviewed following labs and imaging studies  CBC: Recent Labs  Lab 10/06/19 0150 10/07/19 0719 10/08/19 0712  WBC 7.6 12.3* 10.5  NEUTROABS 5.1  --   --   HGB 12.2 11.4* 10.6*  HCT 39.0 36.6 33.6*  MCV 95.1 93.1 94.6  PLT 173 188 [id number]    Basic Metabolic Panel: Recent Labs  Lab 10/06/19 0150 10/07/19 0719 10/08/19 0712  NA 135 140 140  K 3.7 3.4* 3.4*  CL 99 99 101  CO2 26 31 30   GLUCOSE 101* 132* 118*  BUN 9 18 23   CREATININE 0.89 1.00 0.88  CALCIUM 8.5* 8.7* 8.4*  MG  --  2.1 2.2    GFR: Estimated Creatinine Clearance: 50.9 mL/min (by C-G formula based on SCr of 0.88 mg/dL).  Liver Function Tests: Recent Labs  Lab 10/06/19 0150 10/07/19 0719  AST 16 14*  ALT 10 11  ALKPHOS 56 49  BILITOT 0.6 0.4  PROT 6.8 6.2*  ALBUMIN 3.6 3.3*    CBG: No results for input(s): GLUCAP in  the last 168 hours.   Recent Results (from the past 240 hour(s))  Respiratory Panel by RT PCR (Flu A&B, Covid) - Nasopharyngeal Swab     Status: None   Collection Time: 10/06/19  1:26 AM   Specimen: Nasopharyngeal Swab  Result Value Ref Range Status   SARS Coronavirus 2 by RT PCR NEGATIVE NEGATIVE Final    Comment: (NOTE) SARS-CoV-2 target nucleic acids are NOT DETECTED. The SARS-CoV-2 RNA is generally detectable in upper respiratoy specimens during the acute phase of infection. The lowest concentration of SARS-CoV-2 viral copies this assay can detect is 131 copies/mL. A negative result does not preclude SARS-Cov-2 infection and should not be used as the sole basis for treatment or other patient management decisions. A  negative result may occur with  improper specimen collection/handling, submission of specimen other than nasopharyngeal swab, presence of viral mutation(s) within the areas targeted by this assay, and inadequate number of viral copies (<131 copies/mL). A negative result must be combined with clinical observations, patient history, and epidemiological information. The expected result is Negative. Fact Sheet for Patients:  PinkCheek.be Fact Sheet for Healthcare Providers:  GravelBags.it This test is not yet ap proved or cleared by the [place] FDA and  has been authorized for detection and/or diagnosis of SARS-CoV-2 by FDA under an Emergency Use Authorization (EUA). This EUA will remain  in effect (meaning this test can be used) for the duration of the COVID-19 declaration under Section 564(b)(1) of the Act, 21 U.S.C. section 360bbb-3(b)(1), unless the authorization is terminated or revoked sooner.    Influenza A by PCR NEGATIVE NEGATIVE Final   Influenza B by PCR NEGATIVE NEGATIVE Final    Comment: (NOTE) The Xpert Xpress SARS-CoV-2/FLU/RSV assay is intended as an aid in  the diagnosis of influenza from  Nasopharyngeal swab specimens and  should not be used as a sole basis for treatment. Nasal washings and  aspirates are unacceptable for Xpert Xpress SARS-CoV-2/FLU/RSV  testing. Fact Sheet for Patients: PinkCheek.be Fact Sheet for Healthcare Providers: GravelBags.it This test is not yet approved or cleared by the [place] FDA and  has been authorized for detection and/or diagnosis of SARS-CoV-2 by  FDA under an Emergency Use Authorization (EUA). This EUA will remain  in effect (meaning this test can be used) for the duration of the  Covid-19 declaration under Section 564(b)(1) of the Act, 21  U.S.C. section 360bbb-3(b)(1), unless the authorization is  terminated or revoked. Performed at [facility], [address], [place], [place] [address]      Radiology Studies: No results found. Scheduled Meds: . aspirin  81 mg Oral Daily  . atorvastatin  40 mg Oral Daily  . carvedilol  6.25 mg Oral BID WC  . clopidogrel  75 mg Oral Daily  . enoxaparin (LOVENOX) injection  40 mg Subcutaneous Q24H  . furosemide  40 mg Intravenous BID  . methylPREDNISolone (SOLU-MEDROL) injection  40 mg Intravenous Q24H  . nicotine  21 mg Transdermal Daily  . pantoprazole  40 mg Oral Daily  . potassium chloride  40 mEq Oral BID  . sodium chloride flush  3 mL Intravenous Q12H  . spironolactone  12.5 mg Oral Daily   Continuous Infusions: . sodium chloride       LOS: 2 days    Time spent: 28 mins  Keliah Harned Wynetta Emery, MD [facility]   To contact the attending provider between 7A-7P or the covering provider during after hours 7P-7A, please log into the web site [email or web address] and access using universal [place] password for that web site. If you do not have the password, please call the hospital operator.  10/08/2019, 11:58 AM

## 2019-10-09 ENCOUNTER — Encounter (HOSPITAL_COMMUNITY)
Admission: EM | Disposition: A | Discharge status: Home / Self Care | Payer: Self-pay | Source: Home / Self Care | Attending: Family Medicine

## 2019-10-09 ENCOUNTER — Encounter (HOSPITAL_COMMUNITY): Payer: Self-pay | Admitting: Family Medicine

## 2019-10-09 DIAGNOSIS — I5032 Chronic diastolic (congestive) heart failure: Secondary | ICD-10-CM | POA: Diagnosis not present

## 2019-10-09 DIAGNOSIS — J449 Chronic obstructive pulmonary disease, unspecified: Secondary | ICD-10-CM | POA: Diagnosis not present

## 2019-10-09 DIAGNOSIS — R0989 Other specified symptoms and signs involving the circulatory and respiratory systems: Secondary | ICD-10-CM | POA: Diagnosis not present

## 2019-10-09 DIAGNOSIS — I2510 Atherosclerotic heart disease of native coronary artery without angina pectoris: Secondary | ICD-10-CM | POA: Diagnosis not present

## 2019-10-09 DIAGNOSIS — I5021 Acute systolic (congestive) heart failure: Secondary | ICD-10-CM | POA: Diagnosis not present

## 2019-10-09 HISTORY — PX: RIGHT/LEFT HEART CATH AND CORONARY ANGIOGRAPHY: CATH118266

## 2019-10-09 LAB — POCT I-STAT 7, (LYTES, BLD GAS, ICA,H+H)
Acid-Base Excess: 6 mmol/L — ABNORMAL HIGH (ref 0.0–2.0)
Bicarbonate: 29.9 mmol/L — ABNORMAL HIGH (ref 20.0–28.0)
Calcium, Ion: 1.2 mmol/L (ref 1.15–1.40)
HCT: 35 % — ABNORMAL LOW (ref 36.0–46.0)
Hemoglobin: 11.9 g/dL — ABNORMAL LOW (ref 12.0–15.0)
O2 Saturation: 99 %
Potassium: 4 mmol/L (ref 3.5–5.1)
Sodium: 142 mmol/L (ref 135–145)
TCO2: 31 mmol/L (ref 22–32)
pCO2 arterial: 41.1 mmHg (ref 32.0–48.0)
pH, Arterial: 7.469 — ABNORMAL HIGH (ref 7.350–7.450)
pO2, Arterial: 145 mmHg — ABNORMAL HIGH (ref 83.0–108.0)

## 2019-10-09 LAB — CBC
HCT: 34.8 % — ABNORMAL LOW (ref 36.0–46.0)
Hemoglobin: 10.8 g/dL — ABNORMAL LOW (ref 12.0–15.0)
MCH: 29.3 pg (ref 26.0–34.0)
MCHC: 31 g/dL (ref 30.0–36.0)
MCV: 94.6 fL (ref 80.0–100.0)
Platelets: 185 10*3/uL (ref 150–400)
RBC: 3.68 MIL/uL — ABNORMAL LOW (ref 3.87–5.11)
RDW: 15.3 % (ref 11.5–15.5)
WBC: 9.4 10*3/uL (ref 4.0–10.5)
nRBC: 0 % (ref 0.0–0.2)

## 2019-10-09 LAB — BRAIN NATRIURETIC PEPTIDE: B Natriuretic Peptide: 1217 pg/mL — ABNORMAL HIGH (ref 0.0–100.0)

## 2019-10-09 LAB — POCT I-STAT EG7
Acid-Base Excess: 6 mmol/L — ABNORMAL HIGH (ref 0.0–2.0)
Bicarbonate: 31.1 mmol/L — ABNORMAL HIGH (ref 20.0–28.0)
Calcium, Ion: 1.21 mmol/L (ref 1.15–1.40)
HCT: 35 % — ABNORMAL LOW (ref 36.0–46.0)
Hemoglobin: 11.9 g/dL — ABNORMAL LOW (ref 12.0–15.0)
O2 Saturation: 72 %
Potassium: 4 mmol/L (ref 3.5–5.1)
Sodium: 142 mmol/L (ref 135–145)
TCO2: 32 mmol/L (ref 22–32)
pCO2, Ven: 46.4 mmHg (ref 44.0–60.0)
pH, Ven: 7.434 — ABNORMAL HIGH (ref 7.250–7.430)
pO2, Ven: 37 mmHg (ref 32.0–45.0)

## 2019-10-09 LAB — BASIC METABOLIC PANEL
Anion gap: 9 (ref 5–15)
BUN: 23 mg/dL (ref 8–23)
CO2: 29 mmol/L (ref 22–32)
Calcium: 8.6 mg/dL — ABNORMAL LOW (ref 8.9–10.3)
Chloride: 102 mmol/L (ref 98–111)
Creatinine, Ser: 0.82 mg/dL (ref 0.44–1.00)
GFR calc Af Amer: >60 mL/min (ref >60)
GFR calc non Af Amer: >60 mL/min (ref >60)
Glucose, Bld: 145 mg/dL — ABNORMAL HIGH (ref 70–99)
Potassium: 3.8 mmol/L (ref 3.5–5.1)
Sodium: 140 mmol/L (ref 135–145)

## 2019-10-09 LAB — MAGNESIUM: Magnesium: 2.2 mg/dL (ref 1.7–2.4)

## 2019-10-09 SURGERY — RIGHT/LEFT HEART CATH AND CORONARY ANGIOGRAPHY
Anesthesia: LOCAL

## 2019-10-09 MED ORDER — SODIUM CHLORIDE 0.9% FLUSH: 3.0000 mL | INTRAVENOUS | Status: DC | PRN

## 2019-10-09 MED ORDER — HYDRALAZINE HCL 20 MG/ML IJ SOLN: 10.0000 mg | INTRAMUSCULAR | Status: AC | PRN

## 2019-10-09 MED ORDER — ENOXAPARIN SODIUM 40 MG/0.4ML ~~LOC~~ SOLN
40.0000 mg | SUBCUTANEOUS | Status: DC
  Administered 2019-10-10: 40 mg via SUBCUTANEOUS
  Filled 2019-10-09: qty 0.4

## 2019-10-09 MED ORDER — FENTANYL CITRATE (PF) 100 MCG/2ML IJ SOLN
INTRAMUSCULAR | Status: DC | PRN
  Administered 2019-10-09 (×2): 12.5 ug via INTRAVENOUS

## 2019-10-09 MED ORDER — IOHEXOL 350 MG/ML SOLN
INTRAVENOUS | Status: DC | PRN
  Administered 2019-10-09: 40 mL

## 2019-10-09 MED ORDER — SODIUM CHLORIDE 0.9% FLUSH
3.0000 mL | Freq: Two times a day (BID) | INTRAVENOUS | Status: DC
  Administered 2019-10-09 – 2019-10-10 (×2): 3 mL via INTRAVENOUS

## 2019-10-09 MED ORDER — MIDAZOLAM HCL 2 MG/2ML IJ SOLN
INTRAMUSCULAR | Status: AC
  Filled 2019-10-09: qty 2

## 2019-10-09 MED ORDER — VERAPAMIL HCL 2.5 MG/ML IV SOLN
INTRAVENOUS | Status: DC | PRN
  Administered 2019-10-09: 15:00:00 10 mL via INTRA_ARTERIAL

## 2019-10-09 MED ORDER — HEPARIN (PORCINE) IN NACL 1000-0.9 UT/500ML-% IV SOLN
INTRAVENOUS | Status: AC
  Filled 2019-10-09: qty 500

## 2019-10-09 MED ORDER — HEPARIN SODIUM (PORCINE) 1000 UNIT/ML IJ SOLN
INTRAMUSCULAR | Status: AC
  Filled 2019-10-09: qty 1

## 2019-10-09 MED ORDER — TRAMADOL HCL 50 MG PO TABS
50.0000 mg | ORAL_TABLET | Freq: Four times a day (QID) | ORAL | Status: DC | PRN
  Administered 2019-10-09: 50 mg via ORAL
  Filled 2019-10-09: qty 1

## 2019-10-09 MED ORDER — LIDOCAINE HCL (PF) 1 % IJ SOLN
INTRAMUSCULAR | Status: DC | PRN
  Administered 2019-10-09: 1 mL
  Administered 2019-10-09: 4 mL

## 2019-10-09 MED ORDER — ACETAMINOPHEN 325 MG PO TABS: 650.0000 mg | ORAL_TABLET | ORAL | Status: DC | PRN

## 2019-10-09 MED ORDER — FENTANYL CITRATE (PF) 100 MCG/2ML IJ SOLN
INTRAMUSCULAR | Status: AC
  Filled 2019-10-09: qty 2

## 2019-10-09 MED ORDER — HEPARIN (PORCINE) IN NACL 1000-0.9 UT/500ML-% IV SOLN
INTRAVENOUS | Status: DC | PRN
  Administered 2019-10-09: 500 mL

## 2019-10-09 MED ORDER — SODIUM CHLORIDE 0.9 % IV SOLN: INTRAVENOUS | Status: DC

## 2019-10-09 MED ORDER — SACUBITRIL-VALSARTAN 24-26 MG PO TABS
1.0000 | ORAL_TABLET | Freq: Two times a day (BID) | ORAL | Status: DC
  Administered 2019-10-10: 1 via ORAL
  Filled 2019-10-09: qty 1

## 2019-10-09 MED ORDER — HYDROCODONE-ACETAMINOPHEN 5-325 MG PO TABS: 1.0000 | ORAL_TABLET | ORAL | Status: DC | PRN

## 2019-10-09 MED ORDER — LABETALOL HCL 5 MG/ML IV SOLN: 10.0000 mg | INTRAVENOUS | Status: AC | PRN

## 2019-10-09 MED ORDER — VERAPAMIL HCL 2.5 MG/ML IV SOLN
INTRAVENOUS | Status: AC
  Filled 2019-10-09: qty 2

## 2019-10-09 MED ORDER — HEPARIN SODIUM (PORCINE) 1000 UNIT/ML IJ SOLN
INTRAMUSCULAR | Status: DC | PRN
  Administered 2019-10-09: 3500 [IU] via INTRAVENOUS

## 2019-10-09 MED ORDER — SODIUM CHLORIDE 0.9 % IV SOLN: 250.0000 mL | INTRAVENOUS | Status: DC | PRN

## 2019-10-09 MED ORDER — MIDAZOLAM HCL 2 MG/2ML IJ SOLN
INTRAMUSCULAR | Status: DC | PRN
  Administered 2019-10-09 (×2): 0.5 mg via INTRAVENOUS

## 2019-10-09 SURGICAL SUPPLY — 14 items
CATH BALLN WEDGE 5F 110CM (CATHETERS) ×1 IMPLANT
CATH OPTITORQUE TIG 4.0 5F (CATHETERS) ×1 IMPLANT
DEVICE RAD TR BAND REGULAR (VASCULAR PRODUCTS) ×1 IMPLANT
GLIDESHEATH SLEND A-KIT 6F 22G (SHEATH) ×1 IMPLANT
GLIDESHEATH SLEND SS 6F .021 (SHEATH) IMPLANT
GUIDEWIRE INQWIRE 1.5J.035X260 (WIRE) IMPLANT
INQWIRE 1.5J .035X260CM (WIRE) ×2
KIT HEART LEFT (KITS) ×2 IMPLANT
PACK CARDIAC CATHETERIZATION (CUSTOM PROCEDURE TRAY) ×2 IMPLANT
SHEATH GLIDE SLENDER 4/5FR (SHEATH) ×1 IMPLANT
SHEATH PROBE COVER 6X72 (BAG) ×1 IMPLANT
TRANSDUCER W/STOPCOCK (MISCELLANEOUS) ×2 IMPLANT
TUBING CIL FLEX 10 FLL-RA (TUBING) ×2 IMPLANT
WIRE HI TORQ VERSACORE-J 145CM (WIRE) ×1 IMPLANT

## 2019-10-09 NOTE — Progress Notes (Signed)
Patient transported from unit by Care link

## 2019-10-09 NOTE — Progress Notes (Addendum)
Progress Note  Patient Name: [name] Date of Encounter: 10/09/2019  Primary Cardiologist: Carlyle Dolly, MD   Subjective   Denies chest pain, breathing ok at rest.  Inpatient Medications    Scheduled Meds: . aspirin  81 mg Oral Daily  . atorvastatin  40 mg Oral Daily  . carvedilol  6.25 mg Oral BID WC  . clopidogrel  75 mg Oral Daily  . enoxaparin (LOVENOX) injection  40 mg Subcutaneous Q24H  . nicotine  21 mg Transdermal Daily  . pantoprazole  40 mg Oral Daily  . potassium chloride  40 mEq Oral Daily  . [START ON 10/10/2019] sacubitril-valsartan  1 tablet Oral BID  . sodium chloride flush  3 mL Intravenous Q12H  . spironolactone  12.5 mg Oral Daily   Continuous Infusions: . sodium chloride    . [START ON 10/10/2019] sodium chloride     PRN Meds: sodium chloride, albuterol, ondansetron **OR** ondansetron (ZOFRAN) IV, sodium chloride flush, sodium chloride flush, traMADol, traZODone   Vital Signs    Vitals:   10/08/19 2059 10/09/19 0522 10/09/19 0805 10/09/19 0842  BP: (!) 102/50 (!) 96/33  (!) 98/38  Pulse: (!) 56 (!) 52  (!) 52  Resp: 20 19  18   Temp: 98.5 F (36.9 C) 98.5 F (36.9 C)    TempSrc: Oral Oral    SpO2: 94% 92% 94%   Weight:      Height:        Intake/Output Summary (Last 24 hours) at 10/09/2019 0949 Last data filed at 10/09/2019 0430 Gross per 24 hour  Intake 1060 ml  Output 200 ml  Net 860 ml   Last 3 Weights 10/06/2019 10/05/2019 04/20/2014  Weight (lbs) 144 lb 3.2 oz 140 lb 160 lb 12.8 oz  Weight (kg) 65.409 kg 63.504 kg 72.938 kg      Telemetry   SB-NSR  - Personally Reviewed  Physical Exam    GEN: No acute distress.   Neck: No JVD Cardiac: RRR, no murmurs, rubs, or gallops.  Respiratory: decreased breath sounds with scattered wheezes GI: Soft, nontender, non-distended  MS: No edema; No deformity. Neuro:  Nonfocal  Psych: Normal affect   Labs    Chemistry Recent Labs  Lab 10/06/19 0150 10/06/19 0150 10/07/19 0719  10/08/19 0712 10/09/19 0450  NA 135   < > 140 140 140  K 3.7   < > 3.4* 3.4* 3.8  CL 99   < > 99 101 102  CO2 26   < > 31 30 29   GLUCOSE 101*   < > 132* 118* 145*  BUN 9   < > 18 23 23   CREATININE 0.89   < > 1.00 0.88 0.82  CALCIUM 8.5*   < > 8.7* 8.4* 8.6*  PROT 6.8  --  6.2*  --   --   ALBUMIN 3.6  --  3.3*  --   --   AST 16  --  14*  --   --   ALT 10  --  11  --   --   ALKPHOS 56  --  49  --   --   BILITOT 0.6  --  0.4  --   --   GFRNONAA >60   < > 55* >60 >60  GFRAA >60   < > >60 >60 >60  ANIONGAP 10   < > 10 9 9    < > = values in this interval not displayed.     Hematology  Recent Labs  Lab 10/07/19 0719 10/08/19 0712 10/09/19 0450  WBC 12.3* 10.5 9.4  RBC 3.93 3.55* 3.68*  HGB 11.4* 10.6* 10.8*  HCT 36.6 33.6* 34.8*  MCV 93.1 94.6 94.6  MCH 29.0 29.9 29.3  MCHC 31.1 31.5 31.0  RDW 14.9 15.1 15.3  PLT 188 172 185    BNP Recent Labs  Lab 10/07/19 0719 10/08/19 0712 10/09/19 0450  BNP 3,321.0* 1,591.0* 1,217.0*     Radiology    No results found.  Cardiac Studies    2D Echocardiogram 10/06/19 IMPRESSIONS   1. Left ventricular ejection fraction, by estimation, is 20%. The left ventricle has severely decreased function. The left ventricle demonstrates global hypokinesis. There is moderate left ventricular hypertrophy. Left ventricular diastolic parameters  are indeterminate. Elevated left atrial pressure.   2. Right ventricular systolic function is mildly reduced. The right ventricular size is mildly enlarged. There is mildly elevated pulmonary artery systolic pressure.   3. Left atrial size was severely dilated.   4. Right atrial size was severely dilated.   5. Functional MR due to tethering of the posterior leaflet in setting of LV systolic dysfunction. . The mitral valve is abnormal. Moderate to severe mitral valve regurgitation.   6. The aortic valve was not well visualized. Aortic valve regurgitation is mild to moderate. No aortic stenosis is present.    7. The inferior vena cava is normal in size with greater than 50% respiratory variability, suggesting right atrial pressure of 3 mmHg.    CATH: 07/20/2008 severe single-vessel CAD with 99% occlusion of the  mid RCA, with evidence of ulcerated plaque and thrombus.  Dr. Burt Knack  proceeded was successful bare-metal stenting, with no noted  complications.    Patient Profile     74 y.o. female  with a history of BMS RCA 2010, L CEA 2010, COPD, HTN, HLD, tob use, breast CA admitted with acute systolic CHF and new CM EF 20% with moderate to severe MR.  Assessment & Plan    1. Acute systolic CHF, new LVEF 123456 diuresed-I/O's negative 330 since 5/2-not since admission. Crt normal, BNP still up at 1217 but down from 3321. BP running low. Will hold entresto and lasix today. For [facility] today, already scheduled.  2. CAD S/P BMS RCA 2010 on Plavix and ASA-needs RHC/LHC cath. I have reviewed the risks, indications, and alternatives to angioplasty and stenting with the patient. Risks include but are not limited to bleeding, infection, vascular injury, stroke, myocardial infection, arrhythmia, kidney injury, radiation-related injury in the case of prolonged fluoroscopy use, emergency cardiac surgery, and death. The patient understands the risks of serious complication is low (123456) and patient agrees to proceed.   3. COPD exacerbation  4. Tobacco abuse-smoking cessation discussed. Says she won't quit  5. HLD-LDL 60     For questions or updates, please contact [place] Please consult [email or web address] for contact info under      Signed, Estella Husk, PA-C 10/09/2019, 9:49 AM     Attending note:  Patient seen and examined.  I reviewed the chart including evaluation by Dr. Harl Bowie and recent cardiac testing.  [name] presents with acute systolic heart failure and newly documented cardiomyopathy with LVEF approximately 20% with diffuse hypokinesis and elevated filling pressures, mildly reduced RV  contraction, and also moderate to severe mitral regurgitation.  She has been diuresed with improvement in fluid status, current blood pressures somewhat low with systolics in the 0000000, renal function stable with creatinine 0.82 and normal potassium.  BNP has trended down to 1217.  Currently on aspirin, Coreg, Lipitor, Plavix, Lovenox, Aldactone, Lasix, and potassium supplement.  She is scheduled for transfer to [facility] today in anticipation of a right and left heart catheterization.  We will hold Entresto, Lasix, and potassium suppletment for now.  Satira Sark, M.D., F.A.C.C.

## 2019-10-09 NOTE — H&P (View-Only) (Signed)
Progress Note  Patient Name: [name] Date of Encounter: 10/09/2019  Primary Cardiologist: Carolyn Dolly, MD   Subjective   Denies chest pain, breathing ok at rest.  Inpatient Medications    Scheduled Meds: . aspirin  81 mg Oral Daily  . atorvastatin  40 mg Oral Daily  . carvedilol  6.25 mg Oral BID WC  . clopidogrel  75 mg Oral Daily  . enoxaparin (LOVENOX) injection  40 mg Subcutaneous Q24H  . nicotine  21 mg Transdermal Daily  . pantoprazole  40 mg Oral Daily  . potassium chloride  40 mEq Oral Daily  . [START ON 10/10/2019] sacubitril-valsartan  1 tablet Oral BID  . sodium chloride flush  3 mL Intravenous Q12H  . spironolactone  12.5 mg Oral Daily   Continuous Infusions: . sodium chloride    . [START ON 10/10/2019] sodium chloride     PRN Meds: sodium chloride, albuterol, ondansetron **OR** ondansetron (ZOFRAN) IV, sodium chloride flush, sodium chloride flush, traMADol, traZODone   Vital Signs    Vitals:   10/08/19 2059 10/09/19 0522 10/09/19 0805 10/09/19 0842  BP: (!) 102/50 (!) 96/33  (!) 98/38  Pulse: (!) 56 (!) 52  (!) 52  Resp: 20 19  18   Temp: 98.5 F (36.9 C) 98.5 F (36.9 C)    TempSrc: Oral Oral    SpO2: 94% 92% 94%   Weight:      Height:        Intake/Output Summary (Last 24 hours) at 10/09/2019 0949 Last data filed at 10/09/2019 0430 Gross per 24 hour  Intake 1060 ml  Output 200 ml  Net 860 ml   Last 3 Weights 10/06/2019 10/05/2019 04/20/2014  Weight (lbs) 144 lb 3.2 oz 140 lb 160 lb 12.8 oz  Weight (kg) 65.409 kg 63.504 kg 72.938 kg      Telemetry   SB-NSR  - Personally Reviewed  Physical Exam    GEN: No acute distress.   Neck: No JVD Cardiac: RRR, no murmurs, rubs, or gallops.  Respiratory: decreased breath sounds with scattered wheezes GI: Soft, nontender, non-distended  MS: No edema; No deformity. Neuro:  Nonfocal  Psych: Normal affect   Labs    Chemistry Recent Labs  Lab 10/06/19 0150 10/06/19 0150 10/07/19 0719  10/08/19 0712 10/09/19 0450  NA 135   < > 140 140 140  K 3.7   < > 3.4* 3.4* 3.8  CL 99   < > 99 101 102  CO2 26   < > 31 30 29   GLUCOSE 101*   < > 132* 118* 145*  BUN 9   < > 18 23 23   CREATININE 0.89   < > 1.00 0.88 0.82  CALCIUM 8.5*   < > 8.7* 8.4* 8.6*  PROT 6.8  --  6.2*  --   --   ALBUMIN 3.6  --  3.3*  --   --   AST 16  --  14*  --   --   ALT 10  --  11  --   --   ALKPHOS 56  --  49  --   --   BILITOT 0.6  --  0.4  --   --   GFRNONAA >60   < > 55* >60 >60  GFRAA >60   < > >60 >60 >60  ANIONGAP 10   < > 10 9 9    < > = values in this interval not displayed.     Hematology  Recent Labs  Lab 10/07/19 0719 10/08/19 0712 10/09/19 0450  WBC 12.3* 10.5 9.4  RBC 3.93 3.55* 3.68*  HGB 11.4* 10.6* 10.8*  HCT 36.6 33.6* 34.8*  MCV 93.1 94.6 94.6  MCH 29.0 29.9 29.3  MCHC 31.1 31.5 31.0  RDW 14.9 15.1 15.3  PLT 188 172 185    BNP Recent Labs  Lab 10/07/19 0719 10/08/19 0712 10/09/19 0450  BNP 3,321.0* 1,591.0* 1,217.0*     Radiology    No results found.  Cardiac Studies    2D Echocardiogram 10/06/19 IMPRESSIONS   1. Left ventricular ejection fraction, by estimation, is 20%. The left ventricle has severely decreased function. The left ventricle demonstrates global hypokinesis. There is moderate left ventricular hypertrophy. Left ventricular diastolic parameters  are indeterminate. Elevated left atrial pressure.   2. Right ventricular systolic function is mildly reduced. The right ventricular size is mildly enlarged. There is mildly elevated pulmonary artery systolic pressure.   3. Left atrial size was severely dilated.   4. Right atrial size was severely dilated.   5. Functional MR due to tethering of the posterior leaflet in setting of LV systolic dysfunction. . The mitral valve is abnormal. Moderate to severe mitral valve regurgitation.   6. The aortic valve was not well visualized. Aortic valve regurgitation is mild to moderate. No aortic stenosis is present.    7. The inferior vena cava is normal in size with greater than 50% respiratory variability, suggesting right atrial pressure of 3 mmHg.    CATH: 07/20/2008 severe single-vessel CAD with 99% occlusion of the  mid RCA, with evidence of ulcerated plaque and thrombus.  Dr. Burt Mitchell  proceeded was successful bare-metal stenting, with no noted  complications.    Patient Profile     74 y.o. female  with a history of BMS RCA 2010, L CEA 2010, COPD, HTN, HLD, tob use, breast CA admitted with acute systolic CHF and new CM EF 20% with moderate to severe MR.  Assessment & Plan    1. Acute systolic CHF, new LVEF 123456 diuresed-I/O's negative 330 since 5/2-not since admission. Crt normal, BNP still up at 1217 but down from 3321. BP running low. Will hold entresto and lasix today. For [facility] today, already scheduled.  2. CAD S/P BMS RCA 2010 on Plavix and ASA-needs RHC/LHC cath. I have reviewed the risks, indications, and alternatives to angioplasty and stenting with the patient. Risks include but are not limited to bleeding, infection, vascular injury, stroke, myocardial infection, arrhythmia, kidney injury, radiation-related injury in the case of prolonged fluoroscopy use, emergency cardiac surgery, and death. The patient understands the risks of serious complication is low (123456) and patient agrees to proceed.   3. COPD exacerbation  4. Tobacco abuse-smoking cessation discussed. Says she won't quit  5. HLD-LDL 60     For questions or updates, please contact [name] Please consult [email or web address] for contact info under      Signed, Carolyn Husk, PA-C 10/09/2019, 9:49 AM     Attending note:  Patient seen and examined.  I reviewed the chart including evaluation by Dr. Harl Mitchell and recent cardiac testing.  [name] presents with acute systolic heart failure and newly documented cardiomyopathy with LVEF approximately 20% with diffuse hypokinesis and elevated filling pressures, mildly reduced RV  contraction, and also moderate to severe mitral regurgitation.  She has been diuresed with improvement in fluid status, current blood pressures somewhat low with systolics in the 0000000, renal function stable with creatinine 0.82 and normal potassium.  BNP has trended down to 1217.  Currently on aspirin, Coreg, Lipitor, Plavix, Lovenox, Aldactone, Lasix, and potassium supplement.  She is scheduled for transfer to [facility] today in anticipation of a right and left heart catheterization.  We will hold Entresto, Lasix, and potassium suppletment for now.  Satira Sark, M.D., F.A.C.C.

## 2019-10-09 NOTE — Interval H&P Note (Signed)
History and Physical Interval Note:  10/09/2019 2:10 PM  [name]  has presented today for surgery, with the diagnosis of acute systolic heart failure.  The various methods of treatment have been discussed with the patient and family. After consideration of risks, benefits and other options for treatment, the patient has consented to  Procedure(s): RIGHT/LEFT HEART CATH AND CORONARY ANGIOGRAPHY (N/A) as a surgical intervention.  The patient's history has been reviewed, patient examined, no change in status, stable for surgery.  I have reviewed the patient's chart and labs.  Questions were answered to the patient's satisfaction.    Cath Lab Visit (complete for each Cath Lab visit)  Clinical Evaluation Leading to the Procedure:   ACS: Yes.    Non-ACS:  N/A  [name]

## 2019-10-09 NOTE — Plan of Care (Signed)
  Problem: Health Behavior/Discharge Planning: Goal: Ability to manage health-related needs will improve Outcome: Adequate for Discharge   Problem: Clinical Measurements: Goal: Ability to maintain clinical measurements within normal limits will improve Outcome: Adequate for Discharge   

## 2019-10-09 NOTE — Progress Notes (Signed)
PROGRESS NOTE   [name]  [id number] DOB: [birth year] DOA: 10/05/2019 PCP: Carolee Rota, NP   Chief Complaint  Patient presents with  . Shortness of Breath    Brief Narrative:  74 y.o. female, with history of hypertension, hyperlipidemia, GERD, chronic diastolic CHF, COPD, tobacco use came to hospital with complaints of shortness of breath.  Patient stated shortness of breath has been going on for past several months.  She stated she felt worsening of shortness of breath so she came to hospital.  She has been long lost to medical follow up. She continues to smoke cigarettes.  Echo done this admission reveals a new cardiomyopathy EF 20%.  She is being diuresed and then transferred to [facility] for cardiac cath early next week.   Assessment & Plan:   Active Problems:   Hyperlipidemia   Tobacco abuse   Hypertension   Coronary atherosclerosis   Chronic diastolic heart failure (HCC)   Bilateral carotid bruits   COPD with chronic bronchitis (HCC)   GERD   COPD exacerbation (HCC)   Acute systolic congestive heart failure (HCC)   COPD (chronic obstructive pulmonary disease) (HCC)   Acute systolic CHF (congestive heart failure) (HCC)   1. HFrEF - this is a new finding of cardiomyopathy and the [facility] team is concerned about cardiac ischemia and requested patient be transferred to [facility] for cardiac cath. She is being diuresed with IV lasix and responding well to treatments.  Change to oral lasix 40 mg daily her home dose.  Continue to monitor weights, I/Os and electrolytes.  Cardiology recommended entresto 24/26 BID started on 10/08/19.   Pt has started aldactone 12.5 mg daily and coreg 6.25 mg BID.   Pt going for cath at [facility] likely today.  2. Mild COPD exacerbation - suspect underlying cardiac asthma given severe CHF.  She responded well to diuresis, completed steroids. Continue bronchodilators and supplemental oxygen.  3. GERD - protonix ordered for GI protection.  4. Tobacco - Pt  strongly advised to stop and ordered a nicotine patch.  5. Hypertension - well controlled.   6. Hypokalemia - repleted, following BMP.   DVT prophylaxis: lovenox Code Status: Full  Family Communication: daughter [name] updated, her correct number is [phone] Disposition:   Status is: Inpatient  Remains inpatient appropriate because:IV treatments appropriate due to intensity of illness or inability to take PO   Dispo: The patient is from: Home              Anticipated d/c is to: Home              Anticipated d/c date is: 2 days              Patient currently is not medically stable to d/c.   Consultants:   Cardiology   Procedures:  2D Echocardiogram 10/06/19 IMPRESSIONS  1. Left ventricular ejection fraction, by estimation, is 20%. The left ventricle has severely decreased function. The left ventricle demonstrates global hypokinesis. There is moderate left ventricular hypertrophy. Left ventricular diastolic parameters  are indeterminate. Elevated left atrial pressure.  2. Right ventricular systolic function is mildly reduced. The right ventricular size is mildly enlarged. There is mildly elevated pulmonary artery systolic pressure.  3. Left atrial size was severely dilated.  4. Right atrial size was severely dilated.  5. Functional MR due to tethering of the posterior leaflet in setting of LV systolic dysfunction. . The mitral valve is abnormal. Moderate to severe mitral valve regurgitation.  6. The aortic  valve was not well visualized. Aortic valve regurgitation is mild to moderate. No aortic stenosis is present.  7. The inferior vena cava is normal in size with greater than 50% respiratory variability, suggesting right atrial pressure of 3 mmHg.   Antimicrobials:    Subjective: Pt reports reports SOB but overall it has been improving.    Objective: Vitals:   10/08/19 2059 10/09/19 0522 10/09/19 0805 10/09/19 0842  BP: (!) 102/50 (!) 96/33  (!) 98/38  Pulse: (!)  56 (!) 52  (!) 52  Resp: 20 19  18   Temp: 98.5 F (36.9 C) 98.5 F (36.9 C)    TempSrc: Oral Oral    SpO2: 94% 92% 94%   Weight:      Height:        Intake/Output Summary (Last 24 hours) at 10/09/2019 0914 Last data filed at 10/09/2019 0430 Gross per 24 hour  Intake 1060 ml  Output 200 ml  Net 860 ml   Filed Weights   10/05/19 2342 10/06/19 0300  Weight: 63.5 kg 65.4 kg    Examination:  General exam: Appears calm and comfortable  Respiratory system: shallow but clear to auscultation. Cardiovascular system: normal S1 & S2 heard. No JVD, murmurs, rubs, gallops or clicks. trace pedal edema. Gastrointestinal system: Abdomen is nondistended, soft and nontender. No organomegaly or masses felt. Normal bowel sounds heard. Central nervous system: Alert and oriented. No focal neurological deficits. Extremities: trace pretibial edema BLEs. Symmetric 5 x 5 power. Skin: No rashes, lesions or ulcers Psychiatry: Judgement and insight appear normal. FLAT AFFECT unchanged.    Data Reviewed: I have personally reviewed following labs and imaging studies  CBC: Recent Labs  Lab 10/06/19 0150 10/07/19 0719 10/08/19 0712 10/09/19 0450  WBC 7.6 12.3* 10.5 9.4  NEUTROABS 5.1  --   --   --   HGB 12.2 11.4* 10.6* 10.8*  HCT 39.0 36.6 33.6* 34.8*  MCV 95.1 93.1 94.6 94.6  PLT 173 188 172 [id number]    Basic Metabolic Panel: Recent Labs  Lab 10/06/19 0150 10/07/19 0719 10/08/19 0712 10/09/19 0450  NA 135 140 140 140  K 3.7 3.4* 3.4* 3.8  CL 99 99 101 102  CO2 26 31 30 29   GLUCOSE 101* 132* 118* 145*  BUN 9 18 23 23   CREATININE 0.89 1.00 0.88 0.82  CALCIUM 8.5* 8.7* 8.4* 8.6*  MG  --  2.1 2.2 2.2    GFR: Estimated Creatinine Clearance: 54.6 mL/min (by C-G formula based on SCr of 0.82 mg/dL).  Liver Function Tests: Recent Labs  Lab 10/06/19 0150 10/07/19 0719  AST 16 14*  ALT 10 11  ALKPHOS 56 49  BILITOT 0.6 0.4  PROT 6.8 6.2*  ALBUMIN 3.6 3.3*    CBG: No results for  input(s): GLUCAP in the last 168 hours.   Recent Results (from the past 240 hour(s))  Respiratory Panel by RT PCR (Flu A&B, Covid) - Nasopharyngeal Swab     Status: None   Collection Time: 10/06/19  1:26 AM   Specimen: Nasopharyngeal Swab  Result Value Ref Range Status   SARS Coronavirus 2 by RT PCR NEGATIVE NEGATIVE Final    Comment: (NOTE) SARS-CoV-2 target nucleic acids are NOT DETECTED. The SARS-CoV-2 RNA is generally detectable in upper respiratoy specimens during the acute phase of infection. The lowest concentration of SARS-CoV-2 viral copies this assay can detect is 131 copies/mL. A negative result does not preclude SARS-Cov-2 infection and should not be used as the sole basis for  treatment or other patient management decisions. A negative result may occur with  improper specimen collection/handling, submission of specimen other than nasopharyngeal swab, presence of viral mutation(s) within the areas targeted by this assay, and inadequate number of viral copies (<131 copies/mL). A negative result must be combined with clinical observations, patient history, and epidemiological information. The expected result is Negative. Fact Sheet for Patients:  PinkCheek.be Fact Sheet for Healthcare Providers:  GravelBags.it This test is not yet ap proved or cleared by the [place] FDA and  has been authorized for detection and/or diagnosis of SARS-CoV-2 by FDA under an Emergency Use Authorization (EUA). This EUA will remain  in effect (meaning this test can be used) for the duration of the COVID-19 declaration under Section 564(b)(1) of the Act, 21 U.S.C. section 360bbb-3(b)(1), unless the authorization is terminated or revoked sooner.    Influenza A by PCR NEGATIVE NEGATIVE Final   Influenza B by PCR NEGATIVE NEGATIVE Final    Comment: (NOTE) The Xpert Xpress SARS-CoV-2/FLU/RSV assay is intended as an aid in  the  diagnosis of influenza from Nasopharyngeal swab specimens and  should not be used as a sole basis for treatment. Nasal washings and  aspirates are unacceptable for Xpert Xpress SARS-CoV-2/FLU/RSV  testing. Fact Sheet for Patients: PinkCheek.be Fact Sheet for Healthcare Providers: GravelBags.it This test is not yet approved or cleared by the [place] FDA and  has been authorized for detection and/or diagnosis of SARS-CoV-2 by  FDA under an Emergency Use Authorization (EUA). This EUA will remain  in effect (meaning this test can be used) for the duration of the  Covid-19 declaration under Section 564(b)(1) of the Act, 21  U.S.C. section 360bbb-3(b)(1), unless the authorization is  terminated or revoked. Performed at [facility], [address] [place], [place] [address]      Radiology Studies: No results found. Scheduled Meds: . aspirin  81 mg Oral Daily  . atorvastatin  40 mg Oral Daily  . carvedilol  6.25 mg Oral BID WC  . clopidogrel  75 mg Oral Daily  . enoxaparin (LOVENOX) injection  40 mg Subcutaneous Q24H  . furosemide  40 mg Oral Daily  . nicotine  21 mg Transdermal Daily  . pantoprazole  40 mg Oral Daily  . potassium chloride  40 mEq Oral Daily  . sacubitril-valsartan  1 tablet Oral BID  . sodium chloride flush  3 mL Intravenous Q12H  . spironolactone  12.5 mg Oral Daily   Continuous Infusions: . sodium chloride       LOS: 3 days    Time spent: 22 mins  Dmiyah Liscano Wynetta Emery, MD [facility]   To contact the attending provider between 7A-7P or the covering provider during after hours 7P-7A, please log into the web site [email or web address] and access using universal [place] password for that web site. If you do not have the password, please call the hospital operator.  10/09/2019, 9:14 AM

## 2019-10-10 ENCOUNTER — Telehealth: Payer: Self-pay | Admitting: Cardiology

## 2019-10-10 DIAGNOSIS — I10 Essential (primary) hypertension: Secondary | ICD-10-CM | POA: Diagnosis not present

## 2019-10-10 DIAGNOSIS — E782 Mixed hyperlipidemia: Secondary | ICD-10-CM

## 2019-10-10 DIAGNOSIS — I5021 Acute systolic (congestive) heart failure: Secondary | ICD-10-CM | POA: Diagnosis not present

## 2019-10-10 DIAGNOSIS — Z720 Tobacco use: Secondary | ICD-10-CM | POA: Diagnosis not present

## 2019-10-10 LAB — BASIC METABOLIC PANEL
Anion gap: 5 (ref 5–15)
BUN: 21 mg/dL (ref 8–23)
CO2: 29 mmol/L (ref 22–32)
Calcium: 8.6 mg/dL — ABNORMAL LOW (ref 8.9–10.3)
Chloride: 106 mmol/L (ref 98–111)
Creatinine, Ser: 1.01 mg/dL — ABNORMAL HIGH (ref 0.44–1.00)
GFR calc Af Amer: >60 mL/min (ref >60)
GFR calc non Af Amer: 54 mL/min — ABNORMAL LOW (ref >60)
Glucose, Bld: 104 mg/dL — ABNORMAL HIGH (ref 70–99)
Potassium: 4.4 mmol/L (ref 3.5–5.1)
Sodium: 140 mmol/L (ref 135–145)

## 2019-10-10 LAB — CBC
HCT: 36.4 % (ref 36.0–46.0)
Hemoglobin: 11 g/dL — ABNORMAL LOW (ref 12.0–15.0)
MCH: 28.9 pg (ref 26.0–34.0)
MCHC: 30.2 g/dL (ref 30.0–36.0)
MCV: 95.8 fL (ref 80.0–100.0)
Platelets: 179 10*3/uL (ref 150–400)
RBC: 3.8 MIL/uL — ABNORMAL LOW (ref 3.87–5.11)
RDW: 15.2 % (ref 11.5–15.5)
WBC: 8.8 10*3/uL (ref 4.0–10.5)
nRBC: 0 % (ref 0.0–0.2)

## 2019-10-10 LAB — MAGNESIUM: Magnesium: 2.2 mg/dL (ref 1.7–2.4)

## 2019-10-10 MED ORDER — SPIRONOLACTONE 25 MG PO TABS: 12.5000 mg | ORAL_TABLET | Freq: Every day | ORAL | 0 refills | Status: DC

## 2019-10-10 MED ORDER — SACUBITRIL-VALSARTAN 24-26 MG PO TABS: 1.0000 | ORAL_TABLET | Freq: Two times a day (BID) | ORAL | 0 refills | Status: AC

## 2019-10-10 MED ORDER — NICOTINE 21 MG/24HR TD PT24: 21.0000 mg | MEDICATED_PATCH | Freq: Every day | TRANSDERMAL | 0 refills | Status: AC

## 2019-10-10 MED FILL — SPIRONOLACTONE 25 MG TABLET: 25 | 30 days supply | Qty: 15 | Fill #0

## 2019-10-10 MED FILL — ENTRESTO 24 MG-26 MG TABLET: 24-26 | 30 days supply | Qty: 60 | Fill #0

## 2019-10-10 NOTE — Plan of Care (Signed)
  Problem: Education: Goal: Knowledge of General Education information will improve Description: Including pain rating scale, medication(s)/side effects and non-pharmacologic comfort measures Outcome: Progressing   Problem: Health Behavior/Discharge Planning: Goal: Ability to manage health-related needs will improve Outcome: Progressing   Problem: Clinical Measurements: Goal: Ability to maintain clinical measurements within normal limits will improve Outcome: Progressing Goal: Will remain free from infection Outcome: Progressing Goal: Diagnostic test results will improve Outcome: Progressing Goal: Respiratory complications will improve Outcome: Progressing Goal: Cardiovascular complication will be avoided Outcome: Progressing   Problem: Activity: Goal: Risk for activity intolerance will decrease Outcome: Progressing   Problem: Nutrition: Goal: Adequate nutrition will be maintained Outcome: Progressing   Problem: Coping: Goal: Level of anxiety will decrease Outcome: Progressing   Problem: Elimination: Goal: Will not experience complications related to bowel motility Outcome: Progressing Goal: Will not experience complications related to urinary retention Outcome: Progressing   Problem: Pain Managment: Goal: General experience of comfort will improve Outcome: Progressing   Problem: Safety: Goal: Ability to remain free from injury will improve Outcome: Progressing   Problem: Skin Integrity: Goal: Risk for impaired skin integrity will decrease Outcome: Progressing   Problem: Education: Goal: Required Educational Video(s) Outcome: Progressing   Problem: Clinical Measurements: Goal: Postoperative complications will be avoided or minimized Outcome: Progressing   Problem: Skin Integrity: Goal: Demonstration of wound healing without infection will improve Outcome: Progressing   Problem: Education: Goal: Ability to demonstrate management of disease process will  improve Outcome: Progressing Goal: Ability to verbalize understanding of medication therapies will improve Outcome: Progressing Goal: Individualized Educational Video(s) Outcome: Progressing   Problem: Activity: Goal: Capacity to carry out activities will improve Outcome: Progressing   Problem: Cardiac: Goal: Ability to achieve and maintain adequate cardiopulmonary perfusion will improve Outcome: Progressing

## 2019-10-10 NOTE — Discharge Instructions (Signed)

## 2019-10-10 NOTE — Discharge Summary (Signed)
Physician Discharge Summary  [name] DOB: Jul 28, 1945 DOA: 10/05/2019  PCP: Carolee Rota, NP  Admit date: 10/05/2019 Discharge date: 10/10/2019  Admitted From: Home Disposition: Home  Recommendations for Outpatient Follow-up:  1. Follow up with PCP in 1-2 weeks 2. Please obtain BMP/CBC in one week 3. Take new medication regimen.  Discharge Condition: Stable CODE STATUS: Full code Diet recommendation: Low-salt diet.  Fluid restriction 1500 mL a day.  Discharge summary: 74 y.o.female,with history of hypertension, hyperlipidemia, GERD, chronic diastolic CHF,COPD, tobacco use came to hospital with complaints of shortness of breath. Patient stated shortness of breath has been going on for past several months. She stated she felt worsening of shortness of breath so she came to hospital. She has been long lost to medical follow up. She continues to smoke cigarettes.  Echo done this admission revealed a new cardiomyopathy EF 20%.  Acute systolic congestive heart failure: Nonischemic cardiomyopathy.  Patient was found with new onset systolic heart failure. Patient was admitted to [facility].  She was treated with IV diuresis with very good response. Started on Entresto and Aldactone.  Already on Coreg.  On Lasix 40 mg daily.  Currently euvolemic. Underwent ischemic work-up including cardiac catheterization that showed mild coronary artery disease with no significant obstruction.  She will continue aspirin and Plavix.  She is already on high-dose statin that she will continue. Smoking cessation discussed and strongly recommended.  Blood pressures are well controlled.  Electrolytes are normal.  Patient with adequate improvement.  Going home today with heart failure regimen and outpatient follow-up as scheduled by cardiology.  Discharge Diagnoses:  Active Problems:   Hyperlipidemia   Tobacco abuse   Hypertension   Coronary atherosclerosis   Chronic diastolic  heart failure (HCC)   Bilateral carotid bruits   COPD with chronic bronchitis (HCC)   GERD   COPD exacerbation (HCC)   Acute systolic congestive heart failure (HCC)   COPD (chronic obstructive pulmonary disease) (HCC)   Acute systolic CHF (congestive heart failure) [facility])    Discharge Instructions  Discharge Instructions    (HEART FAILURE PATIENTS) Call MD:  Anytime you have any of the following symptoms: 1) 3 pound weight gain in 24 hours or 5 pounds in 1 week 2) shortness of breath, with or without a dry hacking cough 3) swelling in the hands, feet or stomach 4) if you have to sleep on extra pillows at night in order to breathe.   Complete by: As directed    Call MD for:  difficulty breathing, headache or visual disturbances   Complete by: As directed    Diet - low sodium heart healthy   Complete by: As directed    Increase activity slowly   Complete by: As directed      Allergies as of 10/10/2019      Reactions   Gabapentin Swelling      Medication List    STOP taking these medications   hydrocodone-acetaminophen 5-500 MG capsule Commonly known as: LORCET-HD   lisinopril 20 MG tablet Commonly known as: ZESTRIL   potassium chloride 20 MEQ packet Commonly known as: KLOR-CON     TAKE these medications   albuterol 108 (90 Base) MCG/ACT inhaler Commonly known as: VENTOLIN HFA Inhale 2 puffs into the lungs every 6 (six) hours as needed for wheezing or shortness of breath.   aspirin 81 MG tablet Take 81 mg by mouth daily.   carvedilol 6.25 MG tablet Commonly known as: COREG Take 6.25 mg  by mouth 2 (two) times daily with a meal.   clopidogrel 75 MG tablet Commonly known as: PLAVIX Take 75 mg by mouth daily.   furosemide 40 MG tablet Commonly known as: LASIX Take 40 mg by mouth daily.   nicotine 21 mg/24hr patch Commonly known as: NICODERM CQ - dosed in mg/24 hours Place 1 patch (21 mg total) onto the skin daily for 14 days. Start taking on: Oct 11, 2019    sacubitril-valsartan 24-26 MG Commonly known as: ENTRESTO Take 1 tablet by mouth 2 (two) times daily.   simvastatin 80 MG tablet Commonly known as: ZOCOR Take 80 mg by mouth at bedtime.   spironolactone 25 MG tablet Commonly known as: ALDACTONE Take 0.5 tablets (12.5 mg total) by mouth daily. Start taking on: Oct 11, 2019   traMADol 50 MG tablet Commonly known as: ULTRAM Take 50 mg by mouth every 6 (six) hours as needed.   traZODone 100 MG tablet Commonly known as: DESYREL Take 100 mg by mouth at bedtime as needed.      Follow-up Information    Imogene Burn, PA-C Follow up on 10/25/2019.   Specialty: Cardiology Why: Please arrive 15 minutes early for your 1pm post-hospital cardiology follow-up appointment.  Contact information: [phone]          Allergies  Allergen Reactions  . Gabapentin Swelling    Consultations:  Cardiology   Procedures/Studies: CARDIAC CATHETERIZATION  Result Date: 10/09/2019 Conclusions: 1. Mild to moderate, non-obstructive coronary artery disease, including 15% ostial LMCA, 20% proximal and mid LAD, and 40% mid LCx stenoses, as well as 50% proximal and 30% mid/distal RCA lesions. 2. 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  The aneurysm appears larger compared with 2010. 3. Patent stent in the mid RCA with minimal in-stent restenosis. 4. Normal right and left heart filling pressures. 5. Mild pulmonary hypertension. 6. Normal Fick cardiac output/index. Recommendations: 1. Optimize evidence-based medical therapy for acute systolic heart failure.  Reduction in left ventricular ejection fraction is out of proportion to the degree of coronary artery disease, consistent with non-ischemic cardiomyopathy. 2. Aggressive secondary prevention of coronary artery disease. Nelva Bush, MD [facility]   DG Chest Port 1 View  Result Date: 10/06/2019 CLINICAL  DATA:  Shortness of breath EXAM: PORTABLE CHEST 1 VIEW COMPARISON:  April 28, 2014 FINDINGS: There is moderate cardiomegaly. Aortic knob calcifications. Prominence of the pulmonary vasculature with diffuse interstitial markings throughout. No large airspace consolidation. A trace right pleural effusion is present. No acute osseous abnormality. IMPRESSION: Cardiomegaly and interstitial edema. Small right pleural effusion. Electronically Signed   By: Prudencio Pair M.D.   On: 10/06/2019 01:19   ECHOCARDIOGRAM COMPLETE  Result Date: 10/06/2019    ECHOCARDIOGRAM REPORT   Patient Name:   [name] Date of Exam: 10/06/2019 Medical Rec #:  [id number]        Height:       66.0 in Accession #:    [id number]       Weight:       144.2 lb Date of Birth:  [birth year]        BSA:          1.740 m Patient Age:    60 years         BP:           112/63 mmHg Patient Gender: F  HR:           68 bpm. Exam Location:  Inpatient Procedure: 2D Echo Indications:    Congestive Heart Failure 428.0 / I50.9  History:        Patient has no prior history of Echocardiogram examinations.                 CHF, COPD, Signs/Symptoms:Shortness of Breath; Risk                 Factors:Hypertension, Dyslipidemia and Current Smoker.  Sonographer:    Darlina Sicilian RDCS Referring Phys: [place]  1. Left ventricular ejection fraction, by estimation, is 20%. The left ventricle has severely decreased function. The left ventricle demonstrates global hypokinesis. There is moderate left ventricular hypertrophy. Left ventricular diastolic parameters are indeterminate. Elevated left atrial pressure.  2. Right ventricular systolic function is mildly reduced. The right ventricular size is mildly enlarged. There is mildly elevated pulmonary artery systolic pressure.  3. Left atrial size was severely dilated.  4. Right atrial size was severely dilated.  5. Functional MR due to tethering of the posterior leaflet in  setting of LV systolic dysfunction. . The mitral valve is abnormal. Moderate to severe mitral valve regurgitation.  6. The aortic valve was not well visualized. Aortic valve regurgitation is mild to moderate. No aortic stenosis is present.  7. The inferior vena cava is normal in size with greater than 50% respiratory variability, suggesting right atrial pressure of 3 mmHg. FINDINGS  Left Ventricle: Left ventricular ejection fraction, by estimation, is 20%. The left ventricle has severely decreased function. The left ventricle demonstrates global hypokinesis. The left ventricular internal cavity size was normal in size. There is moderate left ventricular hypertrophy. Left ventricular diastolic parameters are indeterminate. Elevated left atrial pressure. Right Ventricle: The right ventricular size is mildly enlarged. No increase in right ventricular wall thickness. Right ventricular systolic function is mildly reduced. There is mildly elevated pulmonary artery systolic pressure. The tricuspid regurgitant  velocity is 2.76 m/s, and with an assumed right atrial pressure of 8 mmHg, the estimated right ventricular systolic pressure is [id number] mmHg. Left Atrium: Left atrial size was severely dilated. Right Atrium: Right atrial size was severely dilated. Pericardium: There is no evidence of pericardial effusion. Mitral Valve: Functional MR due to tethering of the posterior leaflet in setting of LV systolic dysfunction. The mitral valve is abnormal. Moderate to severe mitral valve regurgitation. Tricuspid Valve: The tricuspid valve is normal in structure. Tricuspid valve regurgitation is mild . No evidence of tricuspid stenosis. Aortic Valve: The aortic valve was not well visualized. Aortic valve regurgitation is mild to moderate. Aortic regurgitation PHT measures 415 msec. No aortic stenosis is present. Aortic valve mean gradient measures 2.8 mmHg. Aortic valve peak gradient measures 7.1 mmHg. Pulmonic Valve: The pulmonic  valve was not well visualized. Pulmonic valve regurgitation is not visualized. No evidence of pulmonic stenosis. Aorta: The aortic root is normal in size and structure. Pulmonary Artery: Indeterminant PASP, inadequate TR jet. Venous: The inferior vena cava is normal in size with greater than 50% respiratory variability, suggesting right atrial pressure of 3 mmHg. IAS/Shunts: No atrial level shunt detected by color flow Doppler.  LEFT VENTRICLE PLAX 2D LVIDd:         6.00 cm      Diastology LVIDs:         5.62 cm      LV e' lateral:   3.37 cm/s LV PW:  1.35 cm      LV E/e' lateral: 22.3 LV IVS:        1.22 cm      LV e' medial:    2.44 cm/s                             LV E/e' medial:  30.8  LV Volumes (MOD) LV vol d, MOD A2C: 261.0 ml LV vol d, MOD A4C: 178.0 ml LV vol s, MOD A2C: 181.0 ml LV vol s, MOD A4C: 140.0 ml LV SV MOD A2C:     80.0 ml LV SV MOD A4C:     178.0 ml LV SV MOD BP:      55.3 ml RIGHT VENTRICLE TAPSE (M-mode): 1.5 cm LEFT ATRIUM             Index       RIGHT ATRIUM           Index LA diam:        4.80 cm 2.76 cm/m  RA Area:     23.90 cm LA Vol (A2C):   91.7 ml 52.69 ml/m RA Volume:   81.10 ml  46.60 ml/m LA Vol (A4C):   62.4 ml 35.86 ml/m LA Biplane Vol: 74.6 ml 42.87 ml/m  AORTIC VALVE AV Vmax:           132.91 cm/s AV Vmean:          76.730 cm/s AV VTI:            0.240 m AV Peak Grad:      7.1 mmHg AV Mean Grad:      2.8 mmHg LVOT Vmax:         93.80 cm/s LVOT Vmean:        57.900 cm/s LVOT VTI:          0.166 m LVOT/AV VTI ratio: 0.69 AI PHT:            415 msec  AORTA Ao Root diam: 3.20 cm MITRAL VALVE                 TRICUSPID VALVE MV Area (PHT): 6.04 cm      TR Peak grad:   30.5 mmHg MV Decel Time: 126 msec      TR Vmax:        276.00 cm/s MR Peak grad:    86.5 mmHg MR Mean grad:    56.0 mmHg   SHUNTS MR Vmax:         465.00 cm/s Systemic VTI: 0.17 m MR Vmean:        357.0 cm/s MR PISA:         0.57 cm MR PISA Eff ROA: 5 mm MR PISA Radius:  0.30 cm MV E velocity: 75.15 cm/s  MV A velocity: 81.95 cm/s MV E/A ratio:  0.92 Carlyle Dolly MD Electronically signed by Carlyle Dolly MD Signature Date/Time: 10/06/2019/3:10:42 PM    Final      Subjective: Patient seen and examined.  Early in the morning rounds she asked to be discharged home.  On room air.  Ambulating around.   Discharge Exam: Vitals:   10/10/19 0950 10/10/19 1119  BP: (!) 106/54 (!) 101/44  Pulse: 66 67  Resp:  18  Temp:  97.7 F (36.5 C)  SpO2: (!) 87% 93%   Vitals:   10/10/19 0623 10/10/19 0943 10/10/19 0950 10/10/19 1119  BP: (!) 100/56 (!) 93/52 Marland Kitchen)  106/54 (!) 101/44  Pulse: (!) 58 (!) 59 66 67  Resp:  16  18  Temp:  (!) 97.5 F (36.4 C)  97.7 F (36.5 C)  TempSrc:  Oral  Oral  SpO2:  97% (!) 87% 93%  Weight:      Height:        General: Pt is alert, awake, not in acute distress Cardiovascular: RRR, S1/S2 +, no rubs, no gallops Respiratory: CTA bilaterally, no wheezing, no rhonchi Abdominal: Soft, NT, ND, bowel sounds + Extremities: no edema, no cyanosis    The results of significant diagnostics from this hospitalization (including imaging, microbiology, ancillary and laboratory) are listed below for reference.     Microbiology: Recent Results (from the past 240 hour(s))  Respiratory Panel by RT PCR (Flu A&B, Covid) - Nasopharyngeal Swab     Status: None   Collection Time: 10/06/19  1:26 AM   Specimen: Nasopharyngeal Swab  Result Value Ref Range Status   SARS Coronavirus 2 by RT PCR NEGATIVE NEGATIVE Final    Comment: (NOTE) SARS-CoV-2 target nucleic acids are NOT DETECTED. The SARS-CoV-2 RNA is generally detectable in upper respiratoy specimens during the acute phase of infection. The lowest concentration of SARS-CoV-2 viral copies this assay can detect is 131 copies/mL. A negative result does not preclude SARS-Cov-2 infection and should not be used as the sole basis for treatment or other patient management decisions. A negative result may occur with  improper  specimen collection/handling, submission of specimen other than nasopharyngeal swab, presence of viral mutation(s) within the areas targeted by this assay, and inadequate number of viral copies (<131 copies/mL). A negative result must be combined with clinical observations, patient history, and epidemiological information. The expected result is Negative. Fact Sheet for Patients:  PinkCheek.be Fact Sheet for Healthcare Providers:  GravelBags.it This test is not yet ap proved or cleared by the [place] FDA and  has been authorized for detection and/or diagnosis of SARS-CoV-2 by FDA under an Emergency Use Authorization (EUA). This EUA will remain  in effect (meaning this test can be used) for the duration of the COVID-19 declaration under Section 564(b)(1) of the Act, 21 U.S.C. section 360bbb-3(b)(1), unless the authorization is terminated or revoked sooner.    Influenza A by PCR NEGATIVE NEGATIVE Final   Influenza B by PCR NEGATIVE NEGATIVE Final    Comment: (NOTE) The Xpert Xpress SARS-CoV-2/FLU/RSV assay is intended as an aid in  the diagnosis of influenza from Nasopharyngeal swab specimens and  should not be used as a sole basis for treatment. Nasal washings and  aspirates are unacceptable for Xpert Xpress SARS-CoV-2/FLU/RSV  testing. Fact Sheet for Patients: PinkCheek.be Fact Sheet for Healthcare Providers: GravelBags.it This test is not yet approved or cleared by the [place] FDA and  has been authorized for detection and/or diagnosis of SARS-CoV-2 by  FDA under an Emergency Use Authorization (EUA). This EUA will remain  in effect (meaning this test can be used) for the duration of the  Covid-19 declaration under Section 564(b)(1) of the Act, 21  U.S.C. section 360bbb-3(b)(1), unless the authorization is  terminated or revoked. Performed at [facility], [address] [place], [place] [address]      Labs: BNP (last 3 results) Recent Labs    10/07/19 0719 10/08/19 0712 10/09/19 0450  BNP 3,321.0* 1,591.0* [id number]*   Basic Metabolic Panel: Recent Labs  Lab 10/06/19 0150 10/06/19 0150 10/07/19 0719 10/07/19 0719 10/08/19 [id number] 10/09/19 0450 10/09/19 1431 10/09/19 1441 10/10/19 0458  NA 135   < > 140   < > 140 140 142 142 140  K 3.7   < > 3.4*   < > 3.4* 3.8 4.0 4.0 4.4  CL 99  --  99  --  101 102  --   --  106  CO2 26  --  31  --  30 29  --   --  29  GLUCOSE 101*  --  132*  --  118* 145*  --   --  104*  BUN 9  --  18  --  23 23  --   --  21  CREATININE 0.89  --  1.00  --  0.88 0.82  --   --  1.01*  CALCIUM 8.5*  --  8.7*  --  8.4* 8.6*  --   --  8.6*  MG  --   --  2.1  --  2.2 2.2  --   --  2.2   < > = values in this interval not displayed.   Liver Function Tests: Recent Labs  Lab 10/06/19 0150 10/07/19 0719  AST 16 14*  ALT 10 11  ALKPHOS 56 49  BILITOT 0.6 0.4  PROT 6.8 6.2*  ALBUMIN 3.6 3.3*   No results for input(s): LIPASE, AMYLASE in the last 168 hours. No results for input(s): AMMONIA in the last 168 hours. CBC: Recent Labs  Lab 10/06/19 0150 10/06/19 0150 10/07/19 0719 10/07/19 0719 10/08/19 0712 10/09/19 0450 10/09/19 1431 10/09/19 1441 10/10/19 0458  WBC 7.6  --  12.3*  --  10.5 9.4  --   --  8.8  NEUTROABS 5.1  --   --   --   --   --   --   --   --   HGB 12.2   < > 11.4*   < > 10.6* 10.8* 11.9* 11.9* 11.0*  HCT 39.0   < > 36.6   < > 33.6* 34.8* 35.0* 35.0* 36.4  MCV 95.1  --  93.1  --  94.6 94.6  --   --  95.8  PLT 173  --  188  --  172 185  --   --  179   < > = values in this interval not displayed.   Cardiac Enzymes: No results for input(s): CKTOTAL, CKMB, CKMBINDEX, TROPONINI in the last 168 hours. BNP: Invalid input(s): POCBNP CBG: No results for input(s): GLUCAP in the last 168 hours. D-Dimer No results for input(s): DDIMER in the last 72 hours. Hgb A1c No results for  input(s): HGBA1C in the last 72 hours. Lipid Profile No results for input(s): CHOL, HDL, LDLCALC, TRIG, CHOLHDL, LDLDIRECT in the last 72 hours. Thyroid function studies No results for input(s): TSH, T4TOTAL, T3FREE, THYROIDAB in the last 72 hours.  Invalid input(s): FREET3 Anemia work up No results for input(s): VITAMINB12, FOLATE, FERRITIN, TIBC, IRON, RETICCTPCT in the last 72 hours. Urinalysis    Component Value Date/Time   COLORURINE YELLOW 11/13/2009 1557   APPEARANCEUR CLEAR 11/13/2009 1557   LABSPEC 1.010 11/13/2009 1557   PHURINE 6.0 11/13/2009 1557   GLUCOSEU NEGATIVE 11/13/2009 1557   HGBUR NEGATIVE 11/13/2009 1557   BILIRUBINUR NEGATIVE 11/13/2009 1557   KETONESUR NEGATIVE 11/13/2009 1557   PROTEINUR NEGATIVE 11/13/2009 1557   UROBILINOGEN 0.2 11/13/2009 1557   NITRITE NEGATIVE 11/13/2009 1557   LEUKOCYTESUR  11/13/2009 1557    NEGATIVE MICROSCOPIC NOT DONE ON URINES WITH NEGATIVE PROTEIN, BLOOD, LEUKOCYTES, NITRITE, OR GLUCOSE <1000 mg/dL.  Sepsis Labs Invalid input(s): PROCALCITONIN,  WBC,  LACTICIDVEN Microbiology Recent Results (from the past 240 hour(s))  Respiratory Panel by RT PCR (Flu A&B, Covid) - Nasopharyngeal Swab     Status: None   Collection Time: 10/06/19  1:26 AM   Specimen: Nasopharyngeal Swab  Result Value Ref Range Status   SARS Coronavirus 2 by RT PCR NEGATIVE NEGATIVE Final    Comment: (NOTE) SARS-CoV-2 target nucleic acids are NOT DETECTED. The SARS-CoV-2 RNA is generally detectable in upper respiratoy specimens during the acute phase of infection. The lowest concentration of SARS-CoV-2 viral copies this assay can detect is 131 copies/mL. A negative result does not preclude SARS-Cov-2 infection and should not be used as the sole basis for treatment or other patient management decisions. A negative result may occur with  improper specimen collection/handling, submission of specimen other than nasopharyngeal swab, presence of viral  mutation(s) within the areas targeted by this assay, and inadequate number of viral copies (<131 copies/mL). A negative result must be combined with clinical observations, patient history, and epidemiological information. The expected result is Negative. Fact Sheet for Patients:  PinkCheek.be Fact Sheet for Healthcare Providers:  GravelBags.it This test is not yet ap proved or cleared by the [place] FDA and  has been authorized for detection and/or diagnosis of SARS-CoV-2 by FDA under an Emergency Use Authorization (EUA). This EUA will remain  in effect (meaning this test can be used) for the duration of the COVID-19 declaration under Section 564(b)(1) of the Act, 21 U.S.C. section 360bbb-3(b)(1), unless the authorization is terminated or revoked sooner.    Influenza A by PCR NEGATIVE NEGATIVE Final   Influenza B by PCR NEGATIVE NEGATIVE Final    Comment: (NOTE) The Xpert Xpress SARS-CoV-2/FLU/RSV assay is intended as an aid in  the diagnosis of influenza from Nasopharyngeal swab specimens and  should not be used as a sole basis for treatment. Nasal washings and  aspirates are unacceptable for Xpert Xpress SARS-CoV-2/FLU/RSV  testing. Fact Sheet for Patients: PinkCheek.be Fact Sheet for Healthcare Providers: GravelBags.it This test is not yet approved or cleared by the [place] FDA and  has been authorized for detection and/or diagnosis of SARS-CoV-2 by  FDA under an Emergency Use Authorization (EUA). This EUA will remain  in effect (meaning this test can be used) for the duration of the  Covid-19 declaration under Section 564(b)(1) of the Act, 21  U.S.C. section 360bbb-3(b)(1), unless the authorization is  terminated or revoked. Performed at [facility], [address] [place], [place] [address]      Time coordinating discharge: 40  minutes  SIGNED:   Barb Merino, MD  [facility] 10/10/2019, 12:12 PM

## 2019-10-10 NOTE — Progress Notes (Signed)
Went over discharged instructions with patient and patient's daughter at the bedside. Patient verbalizes understanding of a low sodium diet and smoking cessation. [facility] pharmacy has provided the medication for the patient. IV is taken out and CCMD has been notified.

## 2019-10-10 NOTE — Progress Notes (Addendum)
Progress Note  Patient Name: [name] Date of Encounter: 10/10/2019  Primary Cardiologist: Carlyle Dolly, MD   Subjective   Feels ok.  Inpatient Medications    Scheduled Meds: . aspirin  81 mg Oral Daily  . atorvastatin  40 mg Oral Daily  . carvedilol  6.25 mg Oral BID WC  . clopidogrel  75 mg Oral Daily  . enoxaparin (LOVENOX) injection  40 mg Subcutaneous Q24H  . nicotine  21 mg Transdermal Daily  . pantoprazole  40 mg Oral Daily  . sacubitril-valsartan  1 tablet Oral BID  . sodium chloride flush  3 mL Intravenous Q12H  . sodium chloride flush  3 mL Intravenous Q12H  . spironolactone  12.5 mg Oral Daily   Continuous Infusions: . sodium chloride    . sodium chloride     PRN Meds: sodium chloride, sodium chloride, acetaminophen, albuterol, ondansetron **OR** ondansetron (ZOFRAN) IV, sodium chloride flush, sodium chloride flush, traMADol, traZODone   Vital Signs    Vitals:   10/10/19 0623 10/10/19 0943 10/10/19 0950 10/10/19 1119  BP: (!) 100/56 (!) 93/52 (!) 106/54 (!) 101/44  Pulse: (!) 58 (!) 59 66 67  Resp:  16  18  Temp:  (!) 97.5 F (36.4 C)  97.7 F (36.5 C)  TempSrc:  Oral  Oral  SpO2:  97% (!) 87% 93%  Weight:      Height:        Intake/Output Summary (Last 24 hours) at 10/10/2019 1144 Last data filed at 10/10/2019 0945 Gross per 24 hour  Intake 1496 ml  Output 200 ml  Net 1296 ml   Last 3 Weights 10/10/2019 10/06/2019 10/05/2019  Weight (lbs) 136 lb 4.8 oz 144 lb 3.2 oz 140 lb  Weight (kg) 61.825 kg 65.409 kg 63.504 kg      Telemetry    NSR - Personally Reviewed  ECG      Physical Exam   GEN: No acute distress.  Hard of hearing Neck: No JVD Cardiac: RRR, no murmurs, rubs, or gallops.  Respiratory: Mild wheezing bilaterally GI: Soft, nontender, non-distended  MS: No edema; No deformity.  No hematoma at radial site Neuro:  Nonfocal  Psych: Normal affect   Labs    High Sensitivity Troponin:  No results for input(s):  TROPONINIHS in the last 720 hours.    Chemistry Recent Labs  Lab 10/06/19 0150 10/06/19 0150 10/07/19 0719 10/07/19 0719 10/08/19 [id number] 10/08/19 0712 10/09/19 0450 10/09/19 0450 10/09/19 1431 10/09/19 1441 10/10/19 0458  NA 135   < > 140   < > 140   < > 140   < > 142 142 140  K 3.7   < > 3.4*   < > 3.4*   < > 3.8   < > 4.0 4.0 4.4  CL 99   < > 99   < > 101  --  102  --   --   --  106  CO2 26   < > 31   < > 30  --  29  --   --   --  29  GLUCOSE 101*   < > 132*   < > 118*  --  145*  --   --   --  104*  BUN 9   < > 18   < > 23  --  23  --   --   --  21  CREATININE 0.89   < > 1.00   < >  0.88  --  0.82  --   --   --  1.01*  CALCIUM 8.5*   < > 8.7*   < > 8.4*  --  8.6*  --   --   --  8.6*  PROT 6.8  --  6.2*  --   --   --   --   --   --   --   --   ALBUMIN 3.6  --  3.3*  --   --   --   --   --   --   --   --   AST 16  --  14*  --   --   --   --   --   --   --   --   ALT 10  --  11  --   --   --   --   --   --   --   --   ALKPHOS 56  --  49  --   --   --   --   --   --   --   --   BILITOT 0.6  --  0.4  --   --   --   --   --   --   --   --   GFRNONAA >60   < > 55*   < > >60  --  >60  --   --   --  54*  GFRAA >60   < > >60   < > >60  --  >60  --   --   --  >60  ANIONGAP 10   < > 10   < > 9  --  9  --   --   --  5   < > = values in this interval not displayed.     Hematology Recent Labs  Lab 10/08/19 [phone] 10/08/19 [id number] 10/09/19 0450 10/09/19 0450 10/09/19 1431 10/09/19 1441 10/10/19 0458  WBC 10.5  --  9.4  --   --   --  8.8  RBC 3.55*  --  3.68*  --   --   --  3.80*  HGB 10.6*   < > 10.8*   < > 11.9* 11.9* 11.0*  HCT 33.6*   < > 34.8*   < > 35.0* 35.0* 36.4  MCV 94.6  --  94.6  --   --   --  95.8  MCH 29.9  --  29.3  --   --   --  28.9  MCHC 31.5  --  31.0  --   --   --  30.2  RDW 15.1  --  15.3  --   --   --  15.2  PLT 172  --  185  --   --   --  179   < > = values in this interval not displayed.    BNP Recent Labs  Lab 10/07/19 0719 10/08/19 0712 10/09/19 0450    BNP 3,321.0* 1,591.0* 1,217.0*     DDimer No results for input(s): DDIMER in the last 168 hours.   Radiology    CARDIAC CATHETERIZATION  Result Date: 10/09/2019 Conclusions: 1. Mild to moderate, non-obstructive coronary artery disease, including 15% ostial LMCA, 20% proximal and mid LAD, and 40% mid LCx stenoses, as well as 50% proximal and 30% mid/distal RCA lesions. 2. 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  The aneurysm  appears larger compared with 2010. 3. Patent stent in the mid RCA with minimal in-stent restenosis. 4. Normal right and left heart filling pressures. 5. Mild pulmonary hypertension. 6. Normal Fick cardiac output/index. Recommendations: 1. Optimize evidence-based medical therapy for acute systolic heart failure.  Reduction in left ventricular ejection fraction is out of proportion to the degree of coronary artery disease, consistent with non-ischemic cardiomyopathy. 2. Aggressive secondary prevention of coronary artery disease. Nelva Bush, MD [facility]    Cardiac Studies   Nonobstructive CAD  Patient Profile     74 y.o. female with nonischemic cardiomyopathy  Assessment & Plan    She appears euvolemic.  Now on good CHF regimen with Entresto, Coreg and can resume home dose of Lasix,.  Will need f/u with Dr. Harl Bowie. Repeat echo after several months of medical therapy.   She is insistent that she will go home today.    Low salt diet.  BP controlled. Avoid tobaco.  Continue atorvastatin for hyperlipidemia   For questions or updates, please contact [place] Please consult [email or web address] for contact info under        Signed, Larae Grooms, MD  10/10/2019, 11:44 AM

## 2019-10-10 NOTE — Telephone Encounter (Signed)
New message   Per Shanon Rosser. Setup a TOC Appt with Estella Husk on 10/25/19 at 1:00 pm.

## 2019-10-10 NOTE — Care Management Important Message (Signed)
Important Message  Patient Details  Name: [name] MRN: [id number] Date of Birth: October 06, 1945   Medicare Important Message Given:  Yes     Shelda Altes 10/10/2019, 10:24 AM

## 2019-10-18 NOTE — Progress Notes (Deleted)
Cardiology Office Note    Date:  10/18/2019   ID:  Dimples, Dukette 11-11-1945, MRN [id number]  PCP:  No primary care provider on file.  Cardiologist: Carlyle Dolly, MD EPS: None  No chief complaint on file.   History of Present Illness:  [name] is a 74 y.o. female with a history of BMS RCA 2010, L CEA 2010, COPD, HTN, HLD, tob use, breast CA admitted 10/09/19 with acute systolic CHF and new CM EF 20% with moderate to severe MR. Cardiac cath 10/09/19 patent stent RCA with minimal ISR, nonobstructive CAD, non-ischemic cardiomyopathy.     Past Medical History:  Diagnosis Date  . Breast cancer ([place])   . Carotid artery stenosis   . COPD (chronic obstructive pulmonary disease) ([place])   . Coronary artery disease   . Depression   . Enlargement of lymph node   . GERD (gastroesophageal reflux disease)   . Hyperlipidemia   . Hypertension   . LBP (low back pain)   . Mitral regurgitation   . Osteoarthritis   . Sciatica   . Systolic congestive heart failure ([facility])   . Tobacco user   . Urge urinary incontinence     Past Surgical History:  Procedure Laterality Date  . APPENDECTOMY     as a child  . BACK SURGERY  1995  . bare-metal stenting of critical mid ICA stenosis     w/ ulcerated plaque/thrombus  . CAROTID ENDARTERECTOMY    . [place]  . CHOLECYSTECTOMY  1980s  . MASTECTOMY  1992   L  . RIGHT/LEFT HEART CATH AND CORONARY ANGIOGRAPHY N/A 10/09/2019   Procedure: RIGHT/LEFT HEART CATH AND CORONARY ANGIOGRAPHY;  Surgeon: Nelva Bush, MD;  Location: [facility];  Service: Cardiovascular;  Laterality: N/A;    Current Medications: No outpatient medications have been marked as taking for the 10/25/19 encounter (Appointment) with Imogene Burn, PA-C.     Allergies:   Gabapentin   Social History   Socioeconomic History  . Marital status: Married    Spouse name: [name]  . Number of children: Y  . Years of education: Not  on file  . Highest education level: Not on file  Occupational History  . 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  Tobacco Use  . Smoking status: Current Every Day Smoker    Packs/day: 1.00    Years: 51.00    Pack years: 51.00    Types: Cigarettes  . Smokeless tobacco: Never Used  Substance and Sexual Activity  . Alcohol use: No  . Drug use: No  . Sexual activity: Not on file  Other Topics Concern  . Not on file  Social History Narrative   Lives with daughter, [name] and [name] and her husband, [name].   Social Determinants of Health   Financial Resource Strain:   . Difficulty of Paying Living Expenses:   Food Insecurity:   . Worried About Charity fundraiser in the Last Year:   . Arboriculturist in the Last Year:   Transportation Needs:   . Film/video editor (Medical):   Marland Kitchen Lack of Transportation (Non-Medical):   Physical Activity:   . Days of Exercise per Week:   . Minutes of Exercise per Session:   Stress:   . Feeling of Stress :   Social Connections:   . Frequency of Communication  with Friends and Family:   . Frequency of Social Gatherings with Friends and Family:   . Attends Religious Services:   . Active Member of Clubs or Organizations:   . Attends Archivist Meetings:   Marland Kitchen Marital Status:      Family History:  The patient's ***family history includes Diabetes in her mother; Heart attack in her mother; Heart disease in her brother; Hypertension in her mother; Lung cancer in her father; Stroke in her brother and father.   ROS:   Please see the history of present illness.    ROS All other systems reviewed and are negative.   PHYSICAL EXAM:   VS:  There were no vitals taken for this visit.  Physical Exam  GEN: Well nourished, well developed, in no acute distress  HEENT: normal  Neck: no JVD, carotid bruits, or masses Cardiac:RRR; no murmurs, rubs, or gallops    Respiratory:  clear to auscultation bilaterally, normal work of breathing GI: soft, nontender, nondistended, + BS Ext: without cyanosis, clubbing, or edema, Good distal pulses bilaterally MS: no deformity or atrophy  Skin: warm and dry, no rash Neuro:  Alert and Oriented x 3, Strength and sensation are intact Psych: euthymic mood, full affect  Wt Readings from Last 3 Encounters:  10/10/19 136 lb 4.8 oz (61.8 kg)  04/20/14 160 lb 12.8 oz (72.9 kg)  02/23/14 164 lb 12.8 oz (74.8 kg)      Studies/Labs Reviewed:   EKG:  EKG is*** ordered today.  The ekg ordered today demonstrates ***  Recent Labs: 10/07/2019: ALT 11 10/09/2019: B Natriuretic Peptide 1,217.0 10/10/2019: BUN 21; Creatinine, Ser 1.01; Hemoglobin 11.0; Magnesium 2.2; Platelets 179; Potassium 4.4; Sodium 140   Lipid Panel    Component Value Date/Time   CHOL 107 10/07/2019 0719   TRIG 74 10/07/2019 0719   HDL 32 (L) 10/07/2019 0719   CHOLHDL 3.3 10/07/2019 0719   VLDL 15 10/07/2019 0719   LDLCALC 60 10/07/2019 0719   LDLDIRECT 94 12/30/2007 2059    Additional studies/ records that were reviewed today include:  Cardiac cath 10/09/19 Conclusions: 1. Mild to moderate, non-obstructive coronary artery disease, including 15% ostial LMCA, 20% proximal and mid LAD, and 40% mid LCx stenoses, as well as 50% proximal and 30% mid/distal RCA lesions. 2. 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  The aneurysm appears larger compared with 2010. 3. Patent stent in the mid RCA with minimal in-stent restenosis. 4. Normal right and left heart filling pressures. 5. Mild pulmonary hypertension. 6. Normal Fick cardiac output/index.   Recommendations: 1. Optimize evidence-based medical therapy for acute systolic heart failure.  Reduction in left ventricular ejection fraction is out of proportion to the degree of coronary artery disease, consistent with non-ischemic cardiomyopathy. 2. Aggressive  secondary prevention of coronary artery disease.   Nelva Bush, MD [facility] 2D Echocardiogram 10/06/19 IMPRESSIONS   1. Left ventricular ejection fraction, by estimation, is 20%. The left ventricle has severely decreased function. The left ventricle demonstrates global hypokinesis. There is moderate left ventricular hypertrophy. Left ventricular diastolic parameters  are indeterminate. Elevated left atrial pressure.   2. Right ventricular systolic function is mildly reduced. The right ventricular size is mildly enlarged. There is mildly elevated pulmonary artery systolic pressure.   3. Left atrial size was severely dilated.   4. Right atrial size was severely dilated.   5. Functional MR due to tethering of the posterior leaflet in setting of LV systolic dysfunction. Marland Kitchen  The mitral valve is abnormal. Moderate to severe mitral valve regurgitation.   6. The aortic valve was not well visualized. Aortic valve regurgitation is mild to moderate. No aortic stenosis is present.   7. The inferior vena cava is normal in size with greater than 50% respiratory variability, suggesting right atrial pressure of 3 mmHg.    CATH: 07/20/2008 severe single-vessel CAD with 99% occlusion of the  mid RCA, with evidence of ulcerated plaque and thrombus.  Dr. Burt Knack  proceeded was successful bare-metal stenting, with no noted  complications.       ASSESSMENT:    No diagnosis found.   PLAN:  In order of problems listed above:  NICM EF 20% echo 09/09/19 on entresto, coreg, aldactone, lasix.  CAD-BMS RCA 2004, cath 10/09/19 nonobstructive CAD medical therapy on Plavix and ASA  Chronic systolic CHF  HTN  HLD  Tobacco abuse    Medication Adjustments/Labs and Tests Ordered: Current medicines are reviewed at length with the patient today.  Concerns regarding medicines are outlined above.  Medication changes, Labs and Tests ordered today are listed in the Patient Instructions below. There are no  Patient Instructions on file for this visit.   Sumner Boast, PA-C  10/18/2019 8:28 AM    [facility] [address], [place], [place]  [address] Phone: [phone]; Fax: [phone]

## 2019-10-25 ENCOUNTER — Ambulatory Visit: Payer: Medicare Other | Admitting: Physician Assistant

## 2019-10-26 ENCOUNTER — Encounter: Payer: Self-pay | Admitting: Physician Assistant

## 2019-12-04 ENCOUNTER — Other Ambulatory Visit: Payer: Self-pay

## 2019-12-04 ENCOUNTER — Emergency Department (HOSPITAL_COMMUNITY): Payer: Medicare Other

## 2019-12-04 ENCOUNTER — Encounter (HOSPITAL_COMMUNITY): Payer: Self-pay | Admitting: *Deleted

## 2019-12-04 ENCOUNTER — Inpatient Hospital Stay (HOSPITAL_COMMUNITY)
Admission: EM | Admit: 2019-12-04 | Discharge: 2019-12-09 | DRG: 291 | Disposition: A | Discharge status: Home / Self Care | Payer: Medicare Other | Attending: Internal Medicine | Admitting: Internal Medicine

## 2019-12-04 DIAGNOSIS — Z853 Personal history of malignant neoplasm of breast: Secondary | ICD-10-CM

## 2019-12-04 DIAGNOSIS — J449 Chronic obstructive pulmonary disease, unspecified: Secondary | ICD-10-CM

## 2019-12-04 DIAGNOSIS — I110 Hypertensive heart disease with heart failure: Secondary | ICD-10-CM | POA: Diagnosis not present

## 2019-12-04 DIAGNOSIS — T465X6A Underdosing of other antihypertensive drugs, initial encounter: Secondary | ICD-10-CM | POA: Diagnosis present

## 2019-12-04 DIAGNOSIS — I509 Heart failure, unspecified: Secondary | ICD-10-CM

## 2019-12-04 DIAGNOSIS — Z7902 Long term (current) use of antithrombotics/antiplatelets: Secondary | ICD-10-CM

## 2019-12-04 DIAGNOSIS — Z9010 Acquired absence of unspecified breast and nipple: Secondary | ICD-10-CM

## 2019-12-04 DIAGNOSIS — Z823 Family history of stroke: Secondary | ICD-10-CM

## 2019-12-04 DIAGNOSIS — I959 Hypotension, unspecified: Secondary | ICD-10-CM | POA: Diagnosis present

## 2019-12-04 DIAGNOSIS — J810 Acute pulmonary edema: Secondary | ICD-10-CM | POA: Diagnosis not present

## 2019-12-04 DIAGNOSIS — I2510 Atherosclerotic heart disease of native coronary artery without angina pectoris: Secondary | ICD-10-CM | POA: Diagnosis present

## 2019-12-04 DIAGNOSIS — Z79899 Other long term (current) drug therapy: Secondary | ICD-10-CM

## 2019-12-04 DIAGNOSIS — H9190 Unspecified hearing loss, unspecified ear: Secondary | ICD-10-CM | POA: Diagnosis present

## 2019-12-04 DIAGNOSIS — E785 Hyperlipidemia, unspecified: Secondary | ICD-10-CM | POA: Diagnosis present

## 2019-12-04 DIAGNOSIS — J441 Chronic obstructive pulmonary disease with (acute) exacerbation: Secondary | ICD-10-CM | POA: Diagnosis present

## 2019-12-04 DIAGNOSIS — Z801 Family history of malignant neoplasm of trachea, bronchus and lung: Secondary | ICD-10-CM

## 2019-12-04 DIAGNOSIS — R0902 Hypoxemia: Secondary | ICD-10-CM | POA: Diagnosis present

## 2019-12-04 DIAGNOSIS — E876 Hypokalemia: Secondary | ICD-10-CM | POA: Diagnosis present

## 2019-12-04 DIAGNOSIS — Z7982 Long term (current) use of aspirin: Secondary | ICD-10-CM

## 2019-12-04 DIAGNOSIS — Z20822 Contact with and (suspected) exposure to covid-19: Secondary | ICD-10-CM | POA: Diagnosis present

## 2019-12-04 DIAGNOSIS — Z833 Family history of diabetes mellitus: Secondary | ICD-10-CM

## 2019-12-04 DIAGNOSIS — Z91120 Patient's intentional underdosing of medication regimen due to financial hardship: Secondary | ICD-10-CM

## 2019-12-04 DIAGNOSIS — Z8249 Family history of ischemic heart disease and other diseases of the circulatory system: Secondary | ICD-10-CM

## 2019-12-04 DIAGNOSIS — J9601 Acute respiratory failure with hypoxia: Secondary | ICD-10-CM | POA: Diagnosis present

## 2019-12-04 DIAGNOSIS — F17210 Nicotine dependence, cigarettes, uncomplicated: Secondary | ICD-10-CM | POA: Diagnosis present

## 2019-12-04 DIAGNOSIS — K219 Gastro-esophageal reflux disease without esophagitis: Secondary | ICD-10-CM | POA: Diagnosis present

## 2019-12-04 DIAGNOSIS — I5043 Acute on chronic combined systolic (congestive) and diastolic (congestive) heart failure: Secondary | ICD-10-CM | POA: Diagnosis present

## 2019-12-04 LAB — CBC
HCT: 36.6 % (ref 36.0–46.0)
Hemoglobin: 11.1 g/dL — ABNORMAL LOW (ref 12.0–15.0)
MCH: 27.1 pg (ref 26.0–34.0)
MCHC: 30.3 g/dL (ref 30.0–36.0)
MCV: 89.3 fL (ref 80.0–100.0)
Platelets: 211 10*3/uL (ref 150–400)
RBC: 4.1 MIL/uL (ref 3.87–5.11)
RDW: 15.8 % — ABNORMAL HIGH (ref 11.5–15.5)
WBC: 7.2 10*3/uL (ref 4.0–10.5)
nRBC: 0 % (ref 0.0–0.2)

## 2019-12-04 LAB — BASIC METABOLIC PANEL
Anion gap: 12 (ref 5–15)
BUN: 9 mg/dL (ref 8–23)
CO2: 26 mmol/L (ref 22–32)
Calcium: 8.7 mg/dL — ABNORMAL LOW (ref 8.9–10.3)
Chloride: 98 mmol/L (ref 98–111)
Creatinine, Ser: 0.88 mg/dL (ref 0.44–1.00)
GFR calc Af Amer: >60 mL/min (ref >60)
GFR calc non Af Amer: >60 mL/min (ref >60)
Glucose, Bld: 114 mg/dL — ABNORMAL HIGH (ref 70–99)
Potassium: 3.5 mmol/L (ref 3.5–5.1)
Sodium: 136 mmol/L (ref 135–145)

## 2019-12-04 LAB — BRAIN NATRIURETIC PEPTIDE: B Natriuretic Peptide: 2430 pg/mL — ABNORMAL HIGH (ref 0.0–100.0)

## 2019-12-04 LAB — SARS CORONAVIRUS 2 BY RT PCR (HOSPITAL ORDER, PERFORMED IN ~~LOC~~ HOSPITAL LAB): SARS Coronavirus 2: NEGATIVE

## 2019-12-04 LAB — TROPONIN I (HIGH SENSITIVITY): Troponin I (High Sensitivity): 19 ng/L — ABNORMAL HIGH (ref <18)

## 2019-12-04 MED ORDER — FUROSEMIDE 10 MG/ML IJ SOLN: 40.0000 mg | INTRAMUSCULAR | Status: AC

## 2019-12-04 MED ORDER — FUROSEMIDE 10 MG/ML IJ SOLN
INTRAMUSCULAR | Status: AC
  Administered 2019-12-04: 40 mg via INTRAVENOUS
  Filled 2019-12-04: qty 4

## 2019-12-04 NOTE — ED Triage Notes (Addendum)
Pt recently signed out of [employer] ama for sob in may,  ems was called out today for sob, pt also has bilateral lower extremity swelling. Pt states that she swelling has gotten worse since being home and that sob has increased as well,

## 2019-12-04 NOTE — ED Provider Notes (Signed)
[facility] EMERGENCY DEPARTMENT Provider Note   CSN: [id number] Arrival date & time: 12/04/19  2114     History Chief Complaint  Patient presents with  . Shortness of Breath    [name] is a 74 y.o. female.  HPI   This patient is a 74 year old female, she has known COPD as well as a history of systolic congestive heart failure.  I have personally reviewed the medical record, her last echocardiogram and heart catheterization occurred in April 2021 and the first week of May 2021.  The echocardiogram showed that she had an estimated 20% ejection fraction with severely decreased left ventricular function moderate LVH.  Her heart catheterization performed several days later showed mild to moderate nonobstructive coronary disease.  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  This patient presents today with increasing shortness of breath, it appears that this been going on for quite some time.  In fact review of the medical record shows that she was admitted to the hospital at the end of April during which those tests were performed, she evidently had left [place].  She presents again today with his worsening shortness of breath, worsening swelling, dyspnea on exertion and severe wheezing.  She feels sweaty.  Symptoms are persistent gradually worsening to become severe prompting her visit.  Past Medical History:  Diagnosis Date  . Breast cancer ([place])   . Carotid artery stenosis   . COPD (chronic obstructive pulmonary disease) ([place])   . Coronary artery disease   . Depression   . Enlargement of lymph node   . GERD (gastroesophageal reflux disease)   . Hyperlipidemia   . Hypertension   . LBP (low back pain)   . Mitral regurgitation   . Osteoarthritis   . Sciatica   . Systolic congestive heart failure ([place])   . Tobacco user   . Urge urinary incontinence      Patient Active Problem List   Diagnosis Date Noted  . COPD exacerbation ([place]) 10/06/2019  . Acute systolic CHF (congestive heart failure) ([place]) 10/06/2019  . Acute systolic congestive heart failure ([place])   . COPD (chronic obstructive pulmonary disease) ([place])   . MITRAL REGURGITATION 03/26/2009  . Coronary atherosclerosis 03/26/2009  . Bilateral carotid bruits 03/26/2009  . Tobacco abuse 02/23/2009  . DEPRESSION 09/04/2007  . CONGESTIVE HEART FAILURE 09/04/2007  . GERD 09/04/2007  . OSTEOARTHRITIS 09/04/2007  . LOW BACK PAIN 09/04/2007  . Hyperlipidemia 09/02/2007  . Hypertension 09/02/2007  . Chronic diastolic heart failure ([place]) 09/02/2007  . COPD with chronic bronchitis ([place]) 09/02/2007  . URINARY INCONTINENCE, URGE 09/02/2007    Past Surgical History:  Procedure Laterality Date  . APPENDECTOMY     as a child  . BACK SURGERY  1995  . bare-metal stenting of critical mid ICA stenosis     w/ ulcerated plaque/thrombus  . CAROTID ENDARTERECTOMY    . [place]  . CHOLECYSTECTOMY  1980s  . MASTECTOMY  1992   L  . RIGHT/LEFT HEART CATH AND CORONARY ANGIOGRAPHY N/A 10/09/2019   Procedure: RIGHT/LEFT HEART CATH AND CORONARY ANGIOGRAPHY;  Surgeon: Nelva Bush, MD;  Location: [facility];  Service: Cardiovascular;  Laterality: N/A;     OB History   No obstetric history on file.     Family History  Problem Relation Age of Onset  . Diabetes Mother   .  Heart attack Mother        deceased at age 56  . Hypertension Mother   . Lung cancer Father        deceased at age 50  . Stroke Father   . Stroke Brother        deceased at age 3s  . Heart disease Brother     Social History   Tobacco Use  . Smoking status: Current Every Day Smoker    Packs/day: 1.00    Years: 51.00    Pack years: 51.00    Types: Cigarettes  . Smokeless tobacco: Never Used  Substance Use Topics  . Alcohol use: No  . Drug use: No    Home Medications Prior  to Admission medications   Medication Sig Start Date End Date Taking? Authorizing Provider  albuterol (VENTOLIN HFA) 108 (90 Base) MCG/ACT inhaler Inhale 2 puffs into the lungs every 6 (six) hours as needed for wheezing or shortness of breath. 05/01/19 04/30/20  [provider]  aspirin 81 MG tablet Take 81 mg by mouth daily.    [provider]  carvedilol (COREG) 6.25 MG tablet Take 6.25 mg by mouth 2 (two) times daily with a meal.      [provider]  clopidogrel (PLAVIX) 75 MG tablet Take 75 mg by mouth daily.      [provider]  furosemide (LASIX) 40 MG tablet Take 40 mg by mouth daily.      [provider]  lisinopril (ZESTRIL) 40 MG tablet Take 40 mg by mouth daily. 11/09/19   [provider]  simvastatin (ZOCOR) 80 MG tablet Take 80 mg by mouth at bedtime.      [provider]  spironolactone (ALDACTONE) 25 MG tablet Take 0.5 tablets (12.5 mg total) by mouth daily. 10/11/19 11/10/19  Barb Merino, MD  traMADol (ULTRAM) 50 MG tablet Take 50 mg by mouth every 6 (six) hours as needed. 09/07/19   [provider]  traZODone (DESYREL) 100 MG tablet Take 100 mg by mouth at bedtime as needed. 05/01/19   [provider]  buPROPion (WELLBUTRIN XL) 150 MG 24 hr tablet Take 1 tablet (150 mg total) by mouth daily. 01/26/11 08/28/11  Josue Hector, MD  fluticasone (FLONASE) 50 MCG/ACT nasal spray Place 2 sprays into the nose daily.    08/28/11  [provider]    Allergies    Gabapentin  Review of Systems   Review of Systems  All other systems reviewed and are negative.   Physical Exam Updated Vital Signs BP (!) 87/55   Pulse 78   Temp 98.1 F (36.7 C) (Oral)   Resp 19   Ht 1.689 m (5' 6.5")   Wt 63.5 kg   SpO2 96%   BMI 22.26 kg/m   Physical Exam Vitals and nursing note reviewed.  Constitutional:      General: She is in acute distress.     Appearance: She is well-developed. She is diaphoretic.    HENT:     Head: Normocephalic and atraumatic.     Mouth/Throat:     Pharynx: No oropharyngeal exudate.  Eyes:     General: No scleral icterus.       Right eye: No discharge.        Left eye: No discharge.     Conjunctiva/sclera: Conjunctivae normal.     Pupils: Pupils are equal, round, and reactive to light.  Neck:     Thyroid: No thyromegaly.  Vascular: No JVD.     Comments: JVD present bilaterally Cardiovascular:     Rate and Rhythm: Normal rate and regular rhythm.     Heart sounds: Normal heart sounds. No murmur heard.  No friction rub. No gallop.   Pulmonary:     Effort: Tachypnea, accessory muscle usage and respiratory distress present.     Breath sounds: Wheezing and rales present.  Abdominal:     General: Bowel sounds are normal. There is no distension.     Palpations: Abdomen is soft. There is no mass.     Tenderness: There is no abdominal tenderness.  Musculoskeletal:        General: No tenderness. Normal range of motion.     Cervical back: Normal range of motion and neck supple.     Right lower leg: Edema present.     Left lower leg: Edema present.  Lymphadenopathy:     Cervical: No cervical adenopathy.  Skin:    General: Skin is warm.     Findings: No erythema or rash.  Neurological:     Mental Status: She is alert.     Coordination: Coordination normal.  Psychiatric:        Behavior: Behavior normal.     ED Results / Procedures / Treatments   Labs (all labs ordered are listed, but only abnormal results are displayed) Labs Reviewed  BASIC METABOLIC PANEL - Abnormal; Notable for the following components:      Result Value   Glucose, Bld 114 (*)    Calcium 8.7 (*)    All other components within normal limits  CBC - Abnormal; Notable for the following components:   Hemoglobin 11.1 (*)    RDW 15.8 (*)    All other components within normal limits  BRAIN NATRIURETIC PEPTIDE - Abnormal; Notable for the following components:   B Natriuretic Peptide  2,430.0 (*)    All other components within normal limits  TROPONIN I (HIGH SENSITIVITY) - Abnormal; Notable for the following components:   Troponin I (High Sensitivity) 19 (*)    All other components within normal limits  SARS CORONAVIRUS 2 BY RT PCR (HOSPITAL ORDER, [facility])  TROPONIN I (HIGH SENSITIVITY)    EKG EKG Interpretation  Date/Time:  Monday December 04 2019 21:26:17 EDT Ventricular Rate:  76 PR Interval:    QRS Duration: 167 QT Interval:  454 QTC Calculation: 511 R Axis:   -93 Text Interpretation: Sinus rhythm Nonspecific IVCD with LAD Anteroseptal infarct, old since last tracing no significant change Confirmed by Noemi Chapel [phone]) on 12/04/2019 10:06:50 PM   Radiology DG Chest Portable 1 View  Result Date: 12/04/2019 CLINICAL DATA:  Cough. Shortness of breath. Bilateral lower extremity swelling. EXAM: PORTABLE CHEST 1 VIEW COMPARISON:  Radiograph 10/06/2019. FINDINGS: Cardiomegaly, equivocal increased from prior exam. Unchanged mediastinal contours. Aortic atherosclerosis. Progressive interstitial edema from prior exam. Suspected small pleural effusions. Linear atelectasis in the left mid lung zone. No confluent airspace disease. No pneumothorax. Mild hyperinflation. No acute osseous abnormalities are seen. IMPRESSION: Cardiomegaly with interstitial edema and probable small pleural effusions. Findings consistent with CHF. Aortic Atherosclerosis (ICD10-I70.0). Electronically Signed   By: Keith Rake M.D.   On: 12/04/2019 22:14    Procedures .Critical Care Performed by: Noemi Chapel, MD Authorized by: Noemi Chapel, MD   Critical care provider statement:    Critical care time (minutes):  35   Critical care time was exclusive of:  Separately billable procedures and treating other patients  and teaching time   Critical care was necessary to treat or prevent imminent or life-threatening deterioration of the following conditions:  Cardiac  failure   Critical care was time spent personally by me on the following activities:  Blood draw for specimens, development of treatment plan with patient or surrogate, discussions with consultants, evaluation of patient's response to treatment, examination of patient, obtaining history from patient or surrogate, ordering and performing treatments and interventions, ordering and review of laboratory studies, ordering and review of radiographic studies, pulse oximetry, re-evaluation of patient's condition and review of old charts   (including critical care time)  Medications Ordered in ED Medications  furosemide (LASIX) injection 40 mg (40 mg Intravenous Given 12/04/19 2236)    ED Course  I have reviewed the triage vital signs and the nursing notes.  Pertinent labs & imaging results that were available during my care of the patient were reviewed by me and considered in my medical decision making (see chart for details).    MDM Rules/Calculators/A&P                          Unfortunately this patient has severe cardiac dysfunction with a very low ejection fraction of 20%.  She appears critically ill with what appears to be decompensated congestive heart failure.  She will need diuresis but also will need to be admitted to the hospital due to her respiratory distress wheezing increased work of breathing etc.  We will start with Lasix, supplemental oxygen as needed, her x-ray has been evaluated by myself, viewed by myself and shows severe cardiomegaly along with interstitial edema consistent with congestive heart failure.  Her EKG is rather unchanged from prior EKGs, showing likely chronic heart disease with intraventricular conduction delay or bundle branch block.  Labs are pending, will discuss with hospitalist for admission.  This patient continues to be hypoxic, tachypneic, she becomes intermittently diaphoretic and her blood pressure varies between mid 04U systolic up to 889 systolic.  She needs  supplemental oxygen as she drops to approximately 85% on room air all suggestive of acute pulmonary edema.  At this time the patient has been given some Lasix, she may need BiPAP if this continues but at this time she is doing well on 4 L.  She has a severely depressed ejection fraction suggestive that her cardiac function is going to limit her ability to recover quickly.  I will discuss her case with the hospitalist for admission, she is critically ill, please see separate documentation.  Troponin is 19 consistent with CHF and not acute ischemia  Discussed with Dr. Darrick Meigs who will admit  Final Clinical Impression(s) / ED Diagnoses Final diagnoses:  Acute pulmonary edema ([place])  Acute on chronic congestive heart failure, unspecified heart failure type [facility])      Noemi Chapel, MD 12/04/19 [phone]

## 2019-12-05 DIAGNOSIS — J810 Acute pulmonary edema: Secondary | ICD-10-CM | POA: Diagnosis present

## 2019-12-05 DIAGNOSIS — H9190 Unspecified hearing loss, unspecified ear: Secondary | ICD-10-CM | POA: Diagnosis present

## 2019-12-05 DIAGNOSIS — E785 Hyperlipidemia, unspecified: Secondary | ICD-10-CM | POA: Diagnosis present

## 2019-12-05 DIAGNOSIS — Z7902 Long term (current) use of antithrombotics/antiplatelets: Secondary | ICD-10-CM | POA: Diagnosis not present

## 2019-12-05 DIAGNOSIS — I2510 Atherosclerotic heart disease of native coronary artery without angina pectoris: Secondary | ICD-10-CM | POA: Diagnosis present

## 2019-12-05 DIAGNOSIS — E876 Hypokalemia: Secondary | ICD-10-CM | POA: Diagnosis present

## 2019-12-05 DIAGNOSIS — J9601 Acute respiratory failure with hypoxia: Secondary | ICD-10-CM | POA: Diagnosis present

## 2019-12-05 DIAGNOSIS — Z9010 Acquired absence of unspecified breast and nipple: Secondary | ICD-10-CM | POA: Diagnosis not present

## 2019-12-05 DIAGNOSIS — Z823 Family history of stroke: Secondary | ICD-10-CM | POA: Diagnosis not present

## 2019-12-05 DIAGNOSIS — Z91120 Patient's intentional underdosing of medication regimen due to financial hardship: Secondary | ICD-10-CM | POA: Diagnosis not present

## 2019-12-05 DIAGNOSIS — Z853 Personal history of malignant neoplasm of breast: Secondary | ICD-10-CM | POA: Diagnosis not present

## 2019-12-05 DIAGNOSIS — J449 Chronic obstructive pulmonary disease, unspecified: Secondary | ICD-10-CM | POA: Diagnosis not present

## 2019-12-05 DIAGNOSIS — Z8249 Family history of ischemic heart disease and other diseases of the circulatory system: Secondary | ICD-10-CM | POA: Diagnosis not present

## 2019-12-05 DIAGNOSIS — Z20822 Contact with and (suspected) exposure to covid-19: Secondary | ICD-10-CM | POA: Diagnosis present

## 2019-12-05 DIAGNOSIS — F17210 Nicotine dependence, cigarettes, uncomplicated: Secondary | ICD-10-CM | POA: Diagnosis present

## 2019-12-05 DIAGNOSIS — Z801 Family history of malignant neoplasm of trachea, bronchus and lung: Secondary | ICD-10-CM | POA: Diagnosis not present

## 2019-12-05 DIAGNOSIS — I5023 Acute on chronic systolic (congestive) heart failure: Secondary | ICD-10-CM

## 2019-12-05 DIAGNOSIS — I509 Heart failure, unspecified: Secondary | ICD-10-CM

## 2019-12-05 DIAGNOSIS — K219 Gastro-esophageal reflux disease without esophagitis: Secondary | ICD-10-CM | POA: Diagnosis present

## 2019-12-05 DIAGNOSIS — R0902 Hypoxemia: Secondary | ICD-10-CM | POA: Diagnosis not present

## 2019-12-05 DIAGNOSIS — Z79899 Other long term (current) drug therapy: Secondary | ICD-10-CM | POA: Diagnosis not present

## 2019-12-05 DIAGNOSIS — J441 Chronic obstructive pulmonary disease with (acute) exacerbation: Secondary | ICD-10-CM | POA: Diagnosis present

## 2019-12-05 DIAGNOSIS — T465X6A Underdosing of other antihypertensive drugs, initial encounter: Secondary | ICD-10-CM | POA: Diagnosis present

## 2019-12-05 DIAGNOSIS — I5043 Acute on chronic combined systolic (congestive) and diastolic (congestive) heart failure: Secondary | ICD-10-CM | POA: Diagnosis present

## 2019-12-05 DIAGNOSIS — Z7982 Long term (current) use of aspirin: Secondary | ICD-10-CM | POA: Diagnosis not present

## 2019-12-05 DIAGNOSIS — Z833 Family history of diabetes mellitus: Secondary | ICD-10-CM | POA: Diagnosis not present

## 2019-12-05 DIAGNOSIS — I340 Nonrheumatic mitral (valve) insufficiency: Secondary | ICD-10-CM | POA: Diagnosis not present

## 2019-12-05 DIAGNOSIS — I959 Hypotension, unspecified: Secondary | ICD-10-CM | POA: Diagnosis present

## 2019-12-05 DIAGNOSIS — I110 Hypertensive heart disease with heart failure: Secondary | ICD-10-CM | POA: Diagnosis present

## 2019-12-05 LAB — CBC
HCT: 37 % (ref 36.0–46.0)
Hemoglobin: 11.2 g/dL — ABNORMAL LOW (ref 12.0–15.0)
MCH: 27.2 pg (ref 26.0–34.0)
MCHC: 30.3 g/dL (ref 30.0–36.0)
MCV: 89.8 fL (ref 80.0–100.0)
Platelets: 186 10*3/uL (ref 150–400)
RBC: 4.12 MIL/uL (ref 3.87–5.11)
RDW: 15.6 % — ABNORMAL HIGH (ref 11.5–15.5)
WBC: 5.4 10*3/uL (ref 4.0–10.5)
nRBC: 0 % (ref 0.0–0.2)

## 2019-12-05 LAB — COMPREHENSIVE METABOLIC PANEL
ALT: 12 U/L (ref 0–44)
AST: 17 U/L (ref 15–41)
Albumin: 3.5 g/dL (ref 3.5–5.0)
Alkaline Phosphatase: 65 U/L (ref 38–126)
Anion gap: 12 (ref 5–15)
BUN: 10 mg/dL (ref 8–23)
CO2: 27 mmol/L (ref 22–32)
Calcium: 8.6 mg/dL — ABNORMAL LOW (ref 8.9–10.3)
Chloride: 99 mmol/L (ref 98–111)
Creatinine, Ser: 0.86 mg/dL (ref 0.44–1.00)
GFR calc Af Amer: >60 mL/min (ref >60)
GFR calc non Af Amer: >60 mL/min (ref >60)
Glucose, Bld: 135 mg/dL — ABNORMAL HIGH (ref 70–99)
Potassium: 3.4 mmol/L — ABNORMAL LOW (ref 3.5–5.1)
Sodium: 138 mmol/L (ref 135–145)
Total Bilirubin: 0.9 mg/dL (ref 0.3–1.2)
Total Protein: 6.6 g/dL (ref 6.5–8.1)

## 2019-12-05 LAB — TROPONIN I (HIGH SENSITIVITY): Troponin I (High Sensitivity): 24 ng/L — ABNORMAL HIGH (ref <18)

## 2019-12-05 MED ORDER — ASPIRIN EC 81 MG PO TBEC
81.0000 mg | DELAYED_RELEASE_TABLET | Freq: Every day | ORAL | Status: DC
  Administered 2019-12-05 – 2019-12-09 (×5): 81 mg via ORAL
  Filled 2019-12-05 (×5): qty 1

## 2019-12-05 MED ORDER — SPIRONOLACTONE 12.5 MG HALF TABLET
12.5000 mg | ORAL_TABLET | Freq: Every day | ORAL | Status: DC
  Administered 2019-12-06 – 2019-12-09 (×4): 12.5 mg via ORAL
  Filled 2019-12-05 (×7): qty 1

## 2019-12-05 MED ORDER — GUAIFENESIN ER 600 MG PO TB12
ORAL_TABLET | ORAL | Status: AC
  Filled 2019-12-05: qty 1

## 2019-12-05 MED ORDER — ATORVASTATIN CALCIUM 40 MG PO TABS
40.0000 mg | ORAL_TABLET | Freq: Every day | ORAL | Status: DC
  Administered 2019-12-05 – 2019-12-09 (×5): 40 mg via ORAL
  Filled 2019-12-05 (×3): qty 1
  Filled 2019-12-05: qty 2
  Filled 2019-12-05: qty 1

## 2019-12-05 MED ORDER — CLOPIDOGREL BISULFATE 75 MG PO TABS
75.0000 mg | ORAL_TABLET | Freq: Every day | ORAL | Status: DC
  Administered 2019-12-05 – 2019-12-09 (×5): 75 mg via ORAL
  Filled 2019-12-05 (×5): qty 1

## 2019-12-05 MED ORDER — FUROSEMIDE 40 MG PO TABS: 40.0000 mg | ORAL_TABLET | Freq: Every day | ORAL | Status: DC

## 2019-12-05 MED ORDER — IPRATROPIUM-ALBUTEROL 0.5-2.5 (3) MG/3ML IN SOLN
RESPIRATORY_TRACT | Status: AC
  Filled 2019-12-05: qty 3

## 2019-12-05 MED ORDER — IPRATROPIUM-ALBUTEROL 0.5-2.5 (3) MG/3ML IN SOLN
3.0000 mL | Freq: Two times a day (BID) | RESPIRATORY_TRACT | Status: DC
  Administered 2019-12-06: 3 mL via RESPIRATORY_TRACT
  Filled 2019-12-05: qty 3

## 2019-12-05 MED ORDER — SODIUM CHLORIDE 0.9% FLUSH
3.0000 mL | Freq: Two times a day (BID) | INTRAVENOUS | Status: DC
  Administered 2019-12-05 – 2019-12-09 (×9): 3 mL via INTRAVENOUS

## 2019-12-05 MED ORDER — METHYLPREDNISOLONE SODIUM SUCC 125 MG IJ SOLR
INTRAMUSCULAR | Status: AC
  Filled 2019-12-05: qty 2

## 2019-12-05 MED ORDER — SODIUM CHLORIDE 0.9 % IV SOLN: 250.0000 mL | INTRAVENOUS | Status: DC | PRN

## 2019-12-05 MED ORDER — POTASSIUM CHLORIDE CRYS ER 20 MEQ PO TBCR
40.0000 meq | EXTENDED_RELEASE_TABLET | Freq: Once | ORAL | Status: AC
  Administered 2019-12-05: 40 meq via ORAL
  Filled 2019-12-05: qty 2

## 2019-12-05 MED ORDER — FUROSEMIDE 10 MG/ML IJ SOLN
40.0000 mg | Freq: Two times a day (BID) | INTRAMUSCULAR | Status: AC
  Administered 2019-12-05 – 2019-12-06 (×4): 40 mg via INTRAVENOUS
  Filled 2019-12-05 (×3): qty 4

## 2019-12-05 MED ORDER — ENOXAPARIN SODIUM 40 MG/0.4ML ~~LOC~~ SOLN
40.0000 mg | SUBCUTANEOUS | Status: DC
  Administered 2019-12-05 – 2019-12-06 (×2): 40 mg via SUBCUTANEOUS
  Filled 2019-12-05 (×2): qty 0.4

## 2019-12-05 MED ORDER — METHYLPREDNISOLONE SODIUM SUCC 125 MG IJ SOLR
60.0000 mg | Freq: Four times a day (QID) | INTRAMUSCULAR | Status: DC
  Administered 2019-12-05 – 2019-12-06 (×5): 60 mg via INTRAVENOUS
  Filled 2019-12-05 (×4): qty 2

## 2019-12-05 MED ORDER — IPRATROPIUM-ALBUTEROL 0.5-2.5 (3) MG/3ML IN SOLN: 3.0000 mL | Freq: Four times a day (QID) | RESPIRATORY_TRACT | Status: DC | PRN

## 2019-12-05 MED ORDER — CARVEDILOL 3.125 MG PO TABS
6.2500 mg | ORAL_TABLET | Freq: Two times a day (BID) | ORAL | Status: AC
  Administered 2019-12-05 – 2019-12-08 (×7): 6.25 mg via ORAL
  Filled 2019-12-05 (×7): qty 2

## 2019-12-05 MED ORDER — IPRATROPIUM-ALBUTEROL 0.5-2.5 (3) MG/3ML IN SOLN
3.0000 mL | Freq: Four times a day (QID) | RESPIRATORY_TRACT | Status: DC
  Administered 2019-12-05 (×4): 3 mL via RESPIRATORY_TRACT
  Filled 2019-12-05 (×3): qty 3

## 2019-12-05 MED ORDER — SODIUM CHLORIDE 0.9% FLUSH: 3.0000 mL | INTRAVENOUS | Status: DC | PRN

## 2019-12-05 MED ORDER — LEVOFLOXACIN 500 MG PO TABS
500.0000 mg | ORAL_TABLET | Freq: Every day | ORAL | Status: AC
  Administered 2019-12-05 – 2019-12-09 (×5): 500 mg via ORAL
  Filled 2019-12-05 (×5): qty 1

## 2019-12-05 MED ORDER — GUAIFENESIN ER 600 MG PO TB12
600.0000 mg | ORAL_TABLET | Freq: Two times a day (BID) | ORAL | Status: DC
  Administered 2019-12-05 – 2019-12-09 (×9): 600 mg via ORAL
  Filled 2019-12-05 (×9): qty 1

## 2019-12-05 NOTE — Consult Note (Addendum)
Cardiology Consult    Patient ID: [name]; [id number]; [birth year]   Admit date: 12/04/2019 Date of Consult: 12/05/2019  Primary Care Provider: Patient, No Pcp Per Primary Cardiologist: Carlyle Dolly, MD   Patient Profile    [name] is a 74 y.o. female with past medical history of chronic systolic CHF (EF 53% by echocardiogram in 09/2019), CAD (s/p BMS to RCA in 2010, cath in 09/2019 showing mild to moderate disease and patent RCA stent with minimal ISR), HTN, HLD, carotid artery stenosis (s/p L CEA in 2010), moderate to severe MR, COPD and history of breast cancer who is being seen today for the evaluation of CHF at the request of Dr. Roger Shelter.   History of Present Illness   Ms. Hernan was most recently admitted from 4/29 - 10/10/2019 for evaluation of worsening dyspnea and found to have an acute CHF exacerbation and new cardiomyopathy. Echocardiogram showed a reduced EF of 20% with global HK and moderate left ventricular hypertrophy. RV function was mildly reduced. She had severe biatrial dilation and functional MR due to tethering of the posterior leaflet in setting of LV systolic dysfunction with moderate to severe mitral valve regurgitation. She was transferred to [facility] and underwent a cardiac catheterization on 10/09/2019 by Dr. Saunders Revel which showed mild to moderate non-obstructive coronary artery disease, including 15% ostial LMCA, 20% proximal and mid LAD, and 40% mid LCx stenoses, as well as 50% proximal and 30% mid/distal RCA lesions. 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 She had normal filling pressures and mild pulmonary HTN. Continued medical therapy for her CHF was recommended.  Weight at the time of discharge was 136 lbs and she was placed on ASA 81mg  daily, Plavix 75mg  daily, Coreg 6.25mg  BID, Lasix 40mg  daily, Simvastatin 80mg  daily, Entresto 24-26mg  BID and Spironolactone 12.5mg  daily. She had  a follow-up visit scheduled with Ermalinda Barrios, PA-C on 10/25/2019 but did not show-up for her visit.   She presented to [facility] on 12/04/2019 for evaluation of worsening dyspnea and lower extremity edema. She reports worsening symptoms over the past several days and says that her weight had increased by 6 pounds within 24 hours up to 143 lbs. She is unsure of which medications she is currently taking but states she was unable to afford several of the newer medications following her recent hospital discharge. She is able to confirm she is taking Spironolactone but she does not believe she is taking Coreg, Lasix or Entresto. She reports associated orthopnea and lower extremity edema.  Denies any recent chest pain or palpitations.  Initial labs show WBC 7.2, Hgb 11.1, platelets 211, Na+ 136, K+ 3.5 and creatinine 0.88. Initial HS Troponin 19 with repeat of 24. BNP 2430. COVID negative. CXR shows cardiomegaly with interstitial edema and probable small pleural effusion. EKG shows NSR, HR 76 with known LBBB.   She has been admitted and received IV Lasix 40mg  yesterday.  Was scheduled to switch back to PO Lasix today.    Past Medical History:  Diagnosis Date  . Breast cancer ([place])   . Carotid artery stenosis   . COPD (chronic obstructive pulmonary disease) ([place])   . Coronary artery disease   . Depression   . Enlargement of lymph node   . GERD (gastroesophageal reflux disease)   . Hyperlipidemia   . Hypertension   . LBP (low back pain)   . Mitral regurgitation   . Osteoarthritis   .  Sciatica   . Systolic congestive heart failure ([place])   . Tobacco user   . Urge urinary incontinence     Past Surgical History:  Procedure Laterality Date  . APPENDECTOMY     as a child  . BACK SURGERY  1995  . bare-metal stenting of critical mid ICA stenosis     w/ ulcerated plaque/thrombus  . CAROTID ENDARTERECTOMY    . [place]  . CHOLECYSTECTOMY  1980s  . MASTECTOMY  1992   L   . RIGHT/LEFT HEART CATH AND CORONARY ANGIOGRAPHY N/A 10/09/2019   Procedure: RIGHT/LEFT HEART CATH AND CORONARY ANGIOGRAPHY;  Surgeon: Nelva Bush, MD;  Location: [facility];  Service: Cardiovascular;  Laterality: N/A;     Home Medications:  Prior to Admission medications   Medication Sig Start Date End Date Taking? Authorizing Provider  albuterol (VENTOLIN HFA) 108 (90 Base) MCG/ACT inhaler Inhale 2 puffs into the lungs every 6 (six) hours as needed for wheezing or shortness of breath. 05/01/19 04/30/20  [provider]  aspirin 81 MG tablet Take 81 mg by mouth daily.    [provider]  carvedilol (COREG) 6.25 MG tablet Take 6.25 mg by mouth 2 (two) times daily with a meal.      [provider]  clopidogrel (PLAVIX) 75 MG tablet Take 75 mg by mouth daily.      [provider]  furosemide (LASIX) 40 MG tablet Take 40 mg by mouth daily.      [provider]  lisinopril (ZESTRIL) 40 MG tablet Take 40 mg by mouth daily. 11/09/19   [provider]  simvastatin (ZOCOR) 80 MG tablet Take 80 mg by mouth at bedtime.      [provider]  spironolactone (ALDACTONE) 25 MG tablet Take 0.5 tablets (12.5 mg total) by mouth daily. 10/11/19 11/10/19  Barb Merino, MD  traMADol (ULTRAM) 50 MG tablet Take 50 mg by mouth every 6 (six) hours as needed. 09/07/19   [provider]  traZODone (DESYREL) 100 MG tablet Take 100 mg by mouth at bedtime as needed. 05/01/19   [provider]  buPROPion (WELLBUTRIN XL) 150 MG 24 hr tablet Take 1 tablet (150 mg total) by mouth daily. 01/26/11 08/28/11  Josue Hector, MD  fluticasone (FLONASE) 50 MCG/ACT nasal spray Place 2 sprays into the nose daily.    08/28/11  [provider]    Inpatient Medications: Scheduled Meds: . aspirin EC  81 mg Oral Daily  . atorvastatin  40 mg Oral Daily  . carvedilol  6.25 mg Oral BID WC  . clopidogrel  75 mg Oral Daily  . enoxaparin (LOVENOX)  injection  40 mg Subcutaneous Q24H  . furosemide  40 mg Intravenous BID  . guaiFENesin  600 mg Oral BID  . guaiFENesin      . ipratropium-albuterol  3 mL Nebulization Q6H  . ipratropium-albuterol      . levofloxacin  500 mg Oral Daily  . methylPREDNISolone (SOLU-MEDROL) injection  60 mg Intravenous Q6H  . methylPREDNISolone sodium succinate      . sodium chloride flush  3 mL Intravenous Q12H  . spironolactone  12.5 mg Oral Daily   Continuous Infusions: . sodium chloride     PRN Meds: sodium chloride, sodium chloride flush  Allergies:    Allergies  Allergen Reactions  . Gabapentin Swelling    Social History:   Social History   Socioeconomic History  . Marital status: Married  Spouse name: [name]  . Number of children: Y  . Years of education: Not on file  . Highest education level: Not on file  Occupational History  . 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  Tobacco Use  . Smoking status: Current Every Day Smoker    Packs/day: 1.00    Years: 51.00    Pack years: 51.00    Types: Cigarettes  . Smokeless tobacco: Never Used  Substance and Sexual Activity  . Alcohol use: No  . Drug use: No  . Sexual activity: Not on file  Other Topics Concern  . Not on file  Social History Narrative   Lives with daughter, [name] and [name] and her husband, [name].   Social Determinants of Health   Financial Resource Strain:   . Difficulty of Paying Living Expenses:   Food Insecurity:   . Worried About Charity fundraiser in the Last Year:   . Arboriculturist in the Last Year:   Transportation Needs:   . Film/video editor (Medical):   Marland Kitchen Lack of Transportation (Non-Medical):   Physical Activity:   . Days of Exercise per Week:   . Minutes of Exercise per Session:   Stress:   . Feeling of Stress :   Social Connections:   . Frequency of Communication with Friends and Family:   .  Frequency of Social Gatherings with Friends and Family:   . Attends Religious Services:   . Active Member of Clubs or Organizations:   . Attends Archivist Meetings:   Marland Kitchen Marital Status:   Intimate Partner Violence:   . Fear of Current or Ex-Partner:   . Emotionally Abused:   Marland Kitchen Physically Abused:   . Sexually Abused:      Family History:    Family History  Problem Relation Age of Onset  . Diabetes Mother   . Heart attack Mother        deceased at age 67  . Hypertension Mother   . Lung cancer Father        deceased at age 75  . Stroke Father   . Stroke Brother        deceased at age 55s  . Heart disease Brother       Review of Systems    General:  No chills, fever, night sweats or weight changes.  Cardiovascular:  No chest pain, palpitations, paroxysmal nocturnal dyspnea. Positive for dyspnea on exertion, orthopnea and edema.  Dermatological: No rash, lesions/masses Respiratory: No cough, dyspnea Urologic: No hematuria, dysuria Abdominal:   No nausea, vomiting, diarrhea, bright red blood per rectum, melena, or hematemesis Neurologic:  No visual changes, wkns, changes in mental status. All other systems reviewed and are otherwise negative except as noted above.  Physical Exam/Data    Vitals:   12/05/19 0800 12/05/19 0810 12/05/19 0827 12/05/19 0843  BP: (!) 111/50   (!) 121/52  Pulse: 72   61  Resp:    18  Temp:  (!) 97.5 F (36.4 C)  97.9 F (36.6 C)  TempSrc:  Oral  Oral  SpO2: 97%  99% 94%  Weight:      Height:       No intake or output data in the 24 hours ending 12/05/19 1015 Filed Weights   12/04/19 2123  Weight: 63.5 kg   Body mass index is 22.26 kg/m.   General: Pleasant, Caucasian female  appearing in NAD Psych: Normal affect. Neuro: Alert and oriented X 3. Moves all extremities spontaneously. HEENT: Normal  Neck: Supple without bruits or JVD. Lungs:  Resp regular and unlabored, rhonchi along upper lung fields and rales  bilaterally. Heart: RRR no s3, s4, or murmurs. Abdomen: Soft, non-tender, non-distended, BS + x 4.  Extremities: No clubbing or cyanosis. 1+ pitting edema bilaterally. DP/PT/Radials 2+ and equal bilaterally.   EKG:  The EKG was personally reviewed and demonstrates: NSR, HR 76 with known LBBB.  Telemetry:  Not connected to telemetry.    Labs/Studies     Relevant CV Studies:  Echocardiogram: 09/2019 IMPRESSIONS    1. Left ventricular ejection fraction, by estimation, is 20%. The left  ventricle has severely decreased function. The left ventricle demonstrates  global hypokinesis. There is moderate left ventricular hypertrophy. Left  ventricular diastolic parameters  are indeterminate. Elevated left atrial pressure.  2. Right ventricular systolic function is mildly reduced. The right  ventricular size is mildly enlarged. There is mildly elevated pulmonary  artery systolic pressure.  3. Left atrial size was severely dilated.  4. Right atrial size was severely dilated.  5. Functional MR due to tethering of the posterior leaflet in setting of  LV systolic dysfunction. . The mitral valve is abnormal. Moderate to  severe mitral valve regurgitation.  6. The aortic valve was not well visualized. Aortic valve regurgitation  is mild to moderate. No aortic stenosis is present.  7. The inferior vena cava is normal in size with greater than 50%  respiratory variability, suggesting right atrial pressure of 3 mmHg.    Cardiac Catheterization: 10/2019 Conclusions: 1. Mild to moderate, non-obstructive coronary artery disease, including 15% ostial LMCA, 20% proximal and mid LAD, and 40% mid LCx stenoses, as well as 50% proximal and 30% mid/distal RCA lesions. 2. 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  The aneurysm appears larger compared with 2010. 3. Patent stent in the mid RCA with minimal in-stent restenosis. 4. Normal right and left  heart filling pressures. 5. Mild pulmonary hypertension. 6. Normal Fick cardiac output/index.  Recommendations: 1. Optimize evidence-based medical therapy for acute systolic heart failure.  Reduction in left ventricular ejection fraction is out of proportion to the degree of coronary artery disease, consistent with non-ischemic cardiomyopathy. 2. Aggressive secondary prevention of coronary artery disease.  Nelva Bush, MD [facility]   Laboratory Data:  Chemistry Recent Labs  Lab 12/04/19 2135 12/05/19 0915  NA 136 138  K 3.5 3.4*  CL 98 99  CO2 26 27  GLUCOSE 114* 135*  BUN 9 10  CREATININE 0.88 0.86  CALCIUM 8.7* 8.6*  GFRNONAA >60 >60  GFRAA >60 >60  ANIONGAP 12 12    Recent Labs  Lab 12/05/19 0915  PROT 6.6  ALBUMIN 3.5  AST 17  ALT 12  ALKPHOS 65  BILITOT 0.9   Hematology Recent Labs  Lab 12/04/19 2135 12/05/19 0915  WBC 7.2 5.4  RBC 4.10 4.12  HGB 11.1* 11.2*  HCT 36.6 37.0  MCV 89.3 89.8  MCH 27.1 27.2  MCHC 30.3 30.3  RDW 15.8* 15.6*  PLT 211 186   Cardiac EnzymesNo results for input(s): TROPONINI in the last 168 hours. No results for input(s): TROPIPOC in the last 168 hours.  BNP Recent Labs  Lab 12/04/19 2135  BNP 2,430.0*    DDimer No results for input(s): DDIMER in the last 168 hours.  Radiology/Studies:  DG Chest Portable 1 View  Result  Date: 12/04/2019 CLINICAL DATA:  Cough. Shortness of breath. Bilateral lower extremity swelling. EXAM: PORTABLE CHEST 1 VIEW COMPARISON:  Radiograph 10/06/2019. FINDINGS: Cardiomegaly, equivocal increased from prior exam. Unchanged mediastinal contours. Aortic atherosclerosis. Progressive interstitial edema from prior exam. Suspected small pleural effusions. Linear atelectasis in the left mid lung zone. No confluent airspace disease. No pneumothorax. Mild hyperinflation. No acute osseous abnormalities are seen. IMPRESSION: Cardiomegaly with interstitial edema and probable small pleural  effusions. Findings consistent with CHF. Aortic Atherosclerosis (ICD10-I70.0). Electronically Signed   By: Keith Rake M.D.   On: 12/04/2019 22:14     Assessment & Plan    1. Acute on Chronic Systolic CHF - Recent echocardiogram in 09/2019 showed a reduced EF of 20%. Since hospital discharge, she has only been taking one of her newer cardiac medications and she believes this is Spironolactone. She was unable to afford Coreg, Lasix and Entresto. Over the past several days, her weight had increased by over 6 pounds on her home scales and she developed worsening dyspnea on exertion and orthopnea. Weight at the time of prior hospital discharge was 136 lbs, up to 143 lbs on her home scales the day of admission.  - BNP 2430 and CXR consistent with CHF. She received IV Lasix yesterday and given volume overload by examination, will continue with IV Lasix today at 40mg  BID. Follow I&O's along with daily weights.  - She was previously discharged on Coreg 6.25mg  BID, Lasix 40mg  daily, Entresto 24-26mg  BID and Spironolactone 12.5mg  daily but it is unclear what she was taking prior to admission. She did mention cutting a tablet in half which would suggest she had been taking Spironolactone. She has been continued on Coreg. Will restart Spironolactone. I have consulted Case Management for assistance with medications as she reports she does not have Part D and was unable to afford her medications. Will likely not be able to restart Entresto until approved for patient assistance as an outpatient so would consider adding Losartan 12.5mg  daily tomorrow if BP allows. Will ultimately plan for a repeat echo in 3 months to reassess EF and consider referral for CRT at that time given her LBBB if EF remains reduced.   2. CAD - She is s/p BMS to RCA in 2010 with recent cath in 09/2019 showing mild to moderate disease and patent RCA stent with minimal ISR as outlined above. HS Troponin values have been flat at 19 with repeat  of 24. EKG shows NSR, HR 76 with known LBBB.  - continue ASA, Plavix (has been continued on this since prior CEA?), BB and statin therapy.   3. Moderate to Severe MR - Echocardiogram in 09/2019 showed functional MR due to tethering of the posterior leaflet in setting of LV systolic dysfunction with moderate to severe mitral valve regurgitation. Would plan for repeat imaging in 3 months following optimal medical therapy.   4.HLD - She was on Simvastatin 80 mg daily prior to admission. This is above the recommended dose and agree with switching to Atorvastatin 40 mg daily. She will need repeat FLP and LFT's in 6-8 weeks.   5. Carotid Artery Stenosis - She is s/p L CEA in 2010 and most recent dopplers in 2015 showed 60-79% bilateral stenosis. She will need repeat dopplers as an outpatient as this is overdue. Remains on ASA, Plavix and statin therapy.   6. COPD Exacerbation - She has been started on Levaquin, IV steroids and scheduled nebulizer treatments by the admitting team.    For questions  or updates, please contact [place] Please consult [email or web address] for contact info under Cardiology/STEMI.  Signed, Erma Heritage, PA-C 12/05/2019, 10:15 AM Pager: [phone]  Attending note Patient seen and discussed with PA Ahmed Prima, I agree with her documentation. 74 yo female history of CAD with BMS to RCA in 2010, carotid stenosis with left CEA, COPD, HTN, HLD, chronic sysotlic HF LVEF 29% diagonsed 09/2019 during admission admitted with SOB. Being managed for both COPD exacerbation and acute on chronic systolic HF    07/9088 echo LVEF 20%, mild RV dysfunction, severe biatrial enlargement, mod to severe MR 10/2019 cath: mild to mod nonobstructive CAD, CI 3.4, LVEDP 10, mean PA 20, PCWP 9   10/10/19 discharge weight 61.8 kg (136 lbs) Admit weight 63.5 kg (140 lbs)   K 3.5 BUN 9 Cr 0.88 BUN 9 WBC 7.2 Hgb 11.1 Plt 211 BNP 2430 COVID neg hstrop 19-->24 CXR cardiomegaly,  +edema  Patient presents with acute on chronic systolic HF due to medication nonadherence as reported above, issues affording meds.  Dry weight during 10/2019 admission when filling pressures were normal by cath was 136 lbs, admitted at 140 lbs. No I/O data available thus far, she received IV lasix 40mg  x 1 yesterday. STable renal function. Continue coreg, aldactone. Due to cost would remain off entresto and start low dose ARB pending bp's this admission.   Carlyle Dolly MD

## 2019-12-05 NOTE — H&P (Signed)
TRH H&P    Patient Demographics:    [name], is a 74 y.o. female  MRN: [id number]  DOB - [birth year]  Admit Date - 12/04/2019  Referring MD/NP/PA: Noemi Chapel  Outpatient Primary MD for the patient is Patient, No Pcp Per  Patient coming from: Home  Chief complaint-shortness of breath   HPI:    [name]  is a 74 y.o. female, with history of hypertension, hyperlipidemia, GERD, chronic systolic and diastolic CHF , COPD came to hospital with worsening shortness of breath.  Patient had echocardiogram and left heart catheterization in April 2021 and first week of May 20 21.  Echocardiogram showed EF 20% with severely decreased left function, moderate LVH.  Her heart catheterization performed several days later showed mild to moderate nonobstructive CAD.  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 Patient says that she has been having worsening shortness of breath over the past few days.  Also coughing up clear phlegm.  Denies coughing up yellow phlegm.  No fever or chills.  Denies abdominal pain or dysuria. Denies history of stroke or seizures.    Review of systems:    In addition to the HPI above,    All other systems reviewed and are negative.    Past History of the following :    Past Medical History:  Diagnosis Date   Breast cancer ([place])    Carotid artery stenosis    COPD (chronic obstructive pulmonary disease) (HCC)    Coronary artery disease    Depression    Enlargement of lymph node    GERD (gastroesophageal reflux disease)    Hyperlipidemia    Hypertension    LBP (low back pain)    Mitral regurgitation    Osteoarthritis    Sciatica    Systolic congestive heart failure ([place])    Tobacco user    Urge urinary incontinence       Past Surgical History:    Procedure Laterality Date   APPENDECTOMY     as a child   [place]   bare-metal stenting of critical mid ICA stenosis     w/ ulcerated plaque/thrombus   CAROTID ENDARTERECTOMY     CERVICAL [place], [place]   L   RIGHT/LEFT HEART CATH AND CORONARY ANGIOGRAPHY N/A 10/09/2019   Procedure: RIGHT/LEFT HEART CATH AND CORONARY ANGIOGRAPHY;  Surgeon: Nelva Bush, MD;  Location: [facility];  Service: Cardiovascular;  Laterality: N/A;      Social History:      Social History   Tobacco Use   Smoking status: Current Every Day Smoker    Packs/day: 1.00    Years: 51.00    Pack years: 51.00    Types: Cigarettes   Smokeless tobacco: Never Used  Substance Use Topics   Alcohol use: No       Family History :     Family  History  Problem Relation Age of Onset   Diabetes Mother    Heart attack Mother        deceased at age 52   Hypertension Mother    Lung cancer Father        deceased at age 13   Stroke Father    Stroke Brother        deceased at age 52s   Heart disease Brother       Home Medications:   Prior to Admission medications   Medication Sig Start Date End Date Taking? Authorizing Provider  albuterol (VENTOLIN HFA) 108 (90 Base) MCG/ACT inhaler Inhale 2 puffs into the lungs every 6 (six) hours as needed for wheezing or shortness of breath. 05/01/19 04/30/20  [provider]  aspirin 81 MG tablet Take 81 mg by mouth daily.    [provider]  carvedilol (COREG) 6.25 MG tablet Take 6.25 mg by mouth 2 (two) times daily with a meal.      [provider]  clopidogrel (PLAVIX) 75 MG tablet Take 75 mg by mouth daily.      [provider]  furosemide (LASIX) 40 MG tablet Take 40 mg by mouth daily.      [provider]  lisinopril (ZESTRIL) 40 MG tablet Take 40 mg by mouth daily. 11/09/19   [provider]  simvastatin (ZOCOR) 80 MG  tablet Take 80 mg by mouth at bedtime.      [provider]  spironolactone (ALDACTONE) 25 MG tablet Take 0.5 tablets (12.5 mg total) by mouth daily. 10/11/19 11/10/19  Barb Merino, MD  traMADol (ULTRAM) 50 MG tablet Take 50 mg by mouth every 6 (six) hours as needed. 09/07/19   [provider]  traZODone (DESYREL) 100 MG tablet Take 100 mg by mouth at bedtime as needed. 05/01/19   [provider]  buPROPion (WELLBUTRIN XL) 150 MG 24 hr tablet Take 1 tablet (150 mg total) by mouth daily. 01/26/11 08/28/11  Josue Hector, MD  fluticasone (FLONASE) 50 MCG/ACT nasal spray Place 2 sprays into the nose daily.    08/28/11  [provider]     Allergies:     Allergies  Allergen Reactions   Gabapentin Swelling     Physical Exam:   Vitals  Blood pressure (!) 115/56, pulse 74, temperature 98.1 F (36.7 C), temperature source Oral, resp. rate 16, height 5' 6.5" (1.689 m), weight 63.5 kg, SpO2 100 %.  1.  General: Appears in no acute distress  2. Psychiatric: Alert, oriented x3, intact insight and judgment  3. Neurologic: Cranial nerves II through XII grossly intact, no focal deficit noted  4. HEENMT:  Atraumatic normocephalic, extraocular muscles are intact  5. Respiratory : Bilateral rhonchi auscultated  6. Cardiovascular : S1-S2, regular, no murmur auscultated, no edema in the lower extremity  7. Gastrointestinal:  Abdomen is soft, nontender, no organomegaly  8. Skin:  No rashes noted      Data Review:    CBC Recent Labs  Lab 12/04/19 2135  WBC 7.2  HGB 11.1*  HCT 36.6  PLT 211  MCV 89.3  MCH 27.1  MCHC 30.3  RDW 15.8*   ------------------------------------------------------------------------------------------------------------------  Results for orders placed or performed during the hospital encounter of 12/04/19 (from the past 48 hour(s))  Basic metabolic panel     Status: Abnormal   Collection Time: 12/04/19  9:35 PM    Result Value Ref Range   Sodium 136 135 - 145 mmol/L  Potassium 3.5 3.5 - 5.1 mmol/L   Chloride 98 98 - 111 mmol/L   CO2 26 22 - 32 mmol/L   Glucose, Bld 114 (H) 70 - 99 mg/dL    Comment: Glucose reference range applies only to samples taken after fasting for at least 8 hours.   BUN 9 8 - 23 mg/dL   Creatinine, Ser 0.88 0.44 - 1.00 mg/dL   Calcium 8.7 (L) 8.9 - 10.3 mg/dL   GFR calc non Af Amer >60 >60 mL/min   GFR calc Af Amer >60 >60 mL/min   Anion gap 12 5 - 15    Comment: Performed at [facility], [address] [place], [place] [address]  CBC     Status: Abnormal   Collection Time: 12/04/19  9:35 PM  Result Value Ref Range   WBC 7.2 4.0 - 10.5 K/uL   RBC 4.10 3.87 - 5.11 MIL/uL   Hemoglobin 11.1 (L) 12.0 - 15.0 g/dL   HCT 36.6 36 - 46 %   MCV 89.3 80.0 - 100.0 fL   MCH 27.1 26.0 - 34.0 pg   MCHC 30.3 30.0 - 36.0 g/dL   RDW 15.8 (H) 11.5 - 15.5 %   Platelets 211 150 - 400 K/uL   nRBC 0.0 0.0 - 0.2 %    Comment: Performed at [facility], [address], [place], [place] [address]  Troponin I (High Sensitivity)     Status: Abnormal   Collection Time: 12/04/19  9:35 PM  Result Value Ref Range   Troponin I (High Sensitivity) 19 (H) <18 ng/L    Comment: (NOTE) Elevated high sensitivity troponin I (hsTnI) values and significant  changes across serial measurements may suggest ACS but many other  chronic and acute conditions are known to elevate hsTnI results.  Refer to the "Links" section for chest pain algorithms and additional  guidance. Performed at [facility], [address], [place], [place] [address]   Brain natriuretic peptide     Status: Abnormal   Collection Time: 12/04/19  9:35 PM  Result Value Ref Range   B Natriuretic Peptide 2,430.0 (H) 0.0 - 100.0 pg/mL    Comment: Performed at [facility], [address] [place], [place] [address]  SARS Coronavirus 2 by RT PCR (hospital order, performed in [facility] hospital lab) Nasopharyngeal Nasopharyngeal Swab      Status: None   Collection Time: 12/04/19 10:40 PM   Specimen: Nasopharyngeal Swab  Result Value Ref Range   SARS Coronavirus 2 NEGATIVE NEGATIVE    Comment: (NOTE) SARS-CoV-2 target nucleic acids are NOT DETECTED.  The SARS-CoV-2 RNA is generally detectable in upper and lower respiratory specimens during the acute phase of infection. The lowest concentration of SARS-CoV-2 viral copies this assay can detect is 250 copies / mL. A negative result does not preclude SARS-CoV-2 infection and should not be used as the sole basis for treatment or other patient management decisions.  A negative result may occur with improper specimen collection / handling, submission of specimen other than nasopharyngeal swab, presence of viral mutation(s) within the areas targeted by this assay, and inadequate number of viral copies (<250 copies / mL). A negative result must be combined with clinical observations, patient history, and epidemiological information.  Fact Sheet for Patients:   StrictlyIdeas.no  Fact Sheet for Healthcare Providers: BankingDealers.co.za  This test is not yet approved or  cleared by the [place] FDA and has been authorized for detection and/or diagnosis of SARS-CoV-2 by FDA under an  Emergency Use Authorization (EUA).  This EUA will remain in effect (meaning this test can be used) for the duration of the COVID-19 declaration under Section 564(b)(1) of the Act, 21 U.S.C. section 360bbb-3(b)(1), unless the authorization is terminated or revoked sooner.  Performed at [facility], [address], [place], [place] [address]   Troponin I (High Sensitivity)     Status: Abnormal   Collection Time: 12/04/19 11:55 PM  Result Value Ref Range   Troponin I (High Sensitivity) 24 (H) <18 ng/L    Comment: (NOTE) Elevated high sensitivity troponin I (hsTnI) values and significant  changes across serial measurements may suggest ACS but many  other  chronic and acute conditions are known to elevate hsTnI results.  Refer to the "Links" section for chest pain algorithms and additional  guidance. Performed at [facility], [address] [place], [place] [address]     Chemistries  Recent Labs  Lab 12/04/19 2135  NA 136  K 3.5  CL 98  CO2 26  GLUCOSE 114*  BUN 9  CREATININE 0.88  CALCIUM 8.7*   ------------------------------------------------------------------------------------------------------------------  ------------------------------------------------------------------------------------------------------------------ --------------------------------------------------------------------------------------------------------------- Urine analysis:    Component Value Date/Time   COLORURINE YELLOW 11/13/2009 1557   APPEARANCEUR CLEAR 11/13/2009 1557   LABSPEC 1.010 11/13/2009 1557   PHURINE 6.0 11/13/2009 1557   GLUCOSEU NEGATIVE 11/13/2009 1557   HGBUR NEGATIVE 11/13/2009 1557   [place] 11/13/2009 1557   KETONESUR NEGATIVE 11/13/2009 1557   PROTEINUR NEGATIVE 11/13/2009 1557   UROBILINOGEN 0.2 11/13/2009 1557   NITRITE NEGATIVE 11/13/2009 1557   LEUKOCYTESUR  11/13/2009 1557    NEGATIVE MICROSCOPIC NOT DONE ON URINES WITH NEGATIVE PROTEIN, BLOOD, LEUKOCYTES, NITRITE, OR GLUCOSE <1000 mg/dL.      Imaging Results:    DG Chest Portable 1 View  Result Date: 12/04/2019 CLINICAL DATA:  Cough. Shortness of breath. Bilateral lower extremity swelling. EXAM: PORTABLE CHEST 1 VIEW COMPARISON:  Radiograph 10/06/2019. FINDINGS: Cardiomegaly, equivocal increased from prior exam. Unchanged mediastinal contours. Aortic atherosclerosis. Progressive interstitial edema from prior exam. Suspected small pleural effusions. Linear atelectasis in the left mid lung zone. No confluent airspace disease. No pneumothorax. Mild hyperinflation. No acute osseous abnormalities are seen. IMPRESSION: Cardiomegaly with interstitial  edema and probable small pleural effusions. Findings consistent with CHF. Aortic Atherosclerosis (ICD10-I70.0). Electronically Signed   By: Keith Rake M.D.   On: 12/04/2019 22:14    My personal review of EKG: Rhythm NSR, IVCD, LAD.  No ST changes   Assessment & Plan:    Active Problems:   Acute respiratory failure with hypoxemia (HCC)   1. Acute respiratory failure with hypoxemia-multifactorial patient has underlying CHF, BNP elevated to 2400, she also has COPD.  She does have bilateral rhonchi.  Will start Solu-Medrol 60 mg IV every 6 hours, DuoNeb nebulizer every 6 hours, Mucinex 1 tablet p.o. twice daily.  Patient received Lasix 40 mg IV in the ED.  She does have soft blood pressure.  Will closely monitor on telemetry.  Patient is requiring oxygen 4 L/min via nasal cannula. 2. Acute on chronic systolic and diastolic CHF-patient takes Lasix 40 mg daily at home.  Continue with Lasix 40 mg daily.  Will hold Aldactone due to soft blood pressure.  Continue Coreg 6.25 mg p.o. twice daily.  3. COPD exacerbation-patient started on Solu-Medrol, DuoNeb nebulizer as above.  Continue oxygen via nasal cannula. 4. Hypertension-continue Coreg.  Hold lisinopril as blood pressure is soft.   DVT Prophylaxis-   Lovenox   AM Labs Ordered, also  please review Full Orders  Family Communication: Admission, patients condition and plan of care including tests being ordered have been discussed with the patient  who indicate understanding and agree with the plan and Code Status.  Code Status: Full code  Admission status: Inpatient :The appropriate admission status for this patient is INPATIENT. Inpatient status is judged to be reasonable and necessary in order to provide the required intensity of service to ensure the patient's safety. The patient's presenting symptoms, physical exam findings, and initial radiographic and laboratory data in the context of their chronic comorbidities is felt to place them at  high risk for further clinical deterioration. Furthermore, it is not anticipated that the patient will be medically stable for discharge from the hospital within 2 midnights of admission. The following factors support the admission status of inpatient.     The patient's presenting symptoms include shortness of breath. The worrisome physical exam findings include acute hypoxemic respiratory failure. The initial radiographic and laboratory data are worrisome because of CHF exacerbation. The chronic co-morbidities include COPD.       * I certify that at the point of admission it is my clinical judgment that the patient will require inpatient hospital care spanning beyond 2 midnights from the point of admission due to high intensity of service, high risk for further deterioration and high frequency of surveillance required.*  Time spent in minutes : 60 minutes   Anjulie Dipierro S Council Munguia M.D

## 2019-12-05 NOTE — Progress Notes (Signed)
PROGRESS NOTE    Patient: [name]                            PCP: Patient, No Pcp Per                    DOB: 03/25/1946            DOA: 12/04/2019 VOZ:[id number]             DOS: 12/05/2019, 10:10 AM   LOS: 0 days   Date of Service: The patient was seen and examined on 12/05/2019  Subjective:   The patient was seen and examined this Am. Stable  Still complaining of :  Otherwise no issues overnight .  Brief Narrative:  Per HPI  Evamaria Detore  is a 74 y.o. female, with history of hypertension, hyperlipidemia, GERD, chronic systolic and diastolic CHF , COPD came to hospital with worsening shortness of breath. Reported worsening shortness of breath at rest and with exertion over past few days.  Also having cough with clear phlegm.  Not O2 dependent.  Patient had echocardiogram and left heart catheterization in April 2021 and first week of May 20 21. Echocardiogram showed EF 20% with severely decreased left function, moderate LVH.  Her heart catheterization performed several days later showed mild to moderate nonobstructive CAD.   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 SARS-CoV-2 negative   Assessment & Plan:   Active Problems:   Acute respiratory failure with hypoxemia (HCC)   Acute respiratory failure with hypoxemia -Shortness of breath hypoxemia seems to be multifactorial, including underlying CHF, COPD  -BNP elevated to 2400, she also has COPD.   -She does have bilateral rhonchi.   -We will continue to treat underlying causes including COPD with IV Solu-Medrol, scheduled and as needed nebs, mucolytic's -IV diuretics Lasix 40 mg ..   She does have soft blood pressure.    Will continue to monitor closely on telemetry Patient is requiring oxygen 4 L/min via nasal cannula..... Currently down to 2 L of oxygen via nasal cannula, satting 94%.  Acute on chronic  systolic and diastolic CHF -Shortness of breath with rhonchi, mild crackles at lower bases -Congestion finding on imaging -Elevated proBNP -Monitoring patient very closely -Cardiology consult --we will not repeat imaging and echo unless recommended by cardiology -Continue diuretics-currently on Lasix IV daily -We will monitor I's and O's closely and daily weight. -We will continue home medication including Coreg Holding Aldactone due to soft blood pressure -Mildly elevated troponin, likely ischemic demand due to COPD and CHF exacerbation -Cardiology consulted monitoring closely denies any chest pain known noted changes in EKG Continue beta-blocker  COPD exacerbation -Mild exacerbation with minimal wheezes and rhonchi -patient started on Solu-Medrol, DuoNeb nebulizer as above.   -Continue oxygen via nasal cannula. -Currently on 2 L of oxygen satting 94% --goal is to wean down to room air With O2 sat of greater than 92% -Patient is not O2 dependent at home  Hypertension /hypotensive on admission -On admission blood pressure was 87/55 has improved this morning to 121/52 -Holding home medication of lisinopril and Aldactone due to soft BP -continue Coreg.     Nutritional status:         Cultures; None   Antimicrobials: Empiric p.o. antibiotics of Levaquin (for COPD/bronchitis, shortness of breath, hypoxia)    Consultants:  Cardiology    ------------------------------------------------------------------------------------------------------------------------------------------------  DVT prophylaxis:  SCD/Compression stockings and Lovenox SQ Code Status:   Code Status: Full Code Family Communication: No family member present at bedside- attempt will be made to update daily The above findings and plan of care has been discussed with patient (and family )  in detail,  they expressed understanding and agreement of above. -Advance care planning has been discussed.   Admission  status:    Status is: Inpatient  Remains inpatient appropriate because:Inpatient level of care appropriate due to severity of illness   Dispo: The patient is from: Home              Anticipated d/c is to: Home with home health              Anticipated d/c date is: 2 days              Patient currently is not medically stable to d/c.        Procedures:   No admission procedures for hospital encounter.   None   Antimicrobials:  Anti-infectives (From admission, onward)   Start     Dose/Rate Route Frequency Ordered Stop   12/05/19 1000  levofloxacin (LEVAQUIN) tablet 500 mg     Discontinue    Note to Pharmacy: COPD   500 mg Oral Daily 12/05/19 0723 12/10/19 0959       Medication:  . aspirin EC  81 mg Oral Daily  . atorvastatin  40 mg Oral Daily  . carvedilol  6.25 mg Oral BID WC  . clopidogrel  75 mg Oral Daily  . enoxaparin (LOVENOX) injection  40 mg Subcutaneous Q24H  . furosemide  40 mg Intravenous BID  . guaiFENesin  600 mg Oral BID  . guaiFENesin      . ipratropium-albuterol  3 mL Nebulization Q6H  . ipratropium-albuterol      . levofloxacin  500 mg Oral Daily  . methylPREDNISolone (SOLU-MEDROL) injection  60 mg Intravenous Q6H  . methylPREDNISolone sodium succinate      . sodium chloride flush  3 mL Intravenous Q12H    sodium chloride, sodium chloride flush   Objective:   Vitals:   12/05/19 0800 12/05/19 0810 12/05/19 0827 12/05/19 0843  BP: (!) 111/50   (!) 121/52  Pulse: 72   61  Resp:    18  Temp:  (!) 97.5 F (36.4 C)  97.9 F (36.6 C)  TempSrc:  Oral  Oral  SpO2: 97%  99% 94%  Weight:      Height:       No intake or output data in the 24 hours ending 12/05/19 1010 Filed Weights   12/04/19 2123  Weight: 63.5 kg     Examination:   Physical Exam  Constitution: On 2 L of oxygen satting greater 90%  (not O2 dependent at home ) alert, cooperative, no distress,  Appears calm and comfortable  Psychiatric: Normal and stable mood and  affect, cognition intact,   HEENT: Normocephalic, PERRL, otherwise with in Normal limits  Chest:Chest symmetric Cardio vascular:  S1/S2, RRR, No murmure, No Rubs or Gallops  pulmonary: Clear to auscultation bilaterally, respirations unlabored, negative wheezes / crackles Abdomen: Soft, non-tender, non-distended, bowel sounds,no masses, no organomegaly Muscular skeletal: Limited exam - in bed, able to move all 4 extremities, Normal strength,  Neuro: CNII-XII intact. , normal motor and sensation, reflexes intact  Extremities: +2 pitting edema lower extremities, +2 pulses  Skin: Dry, warm to touch, negative for any  Rashes, No open wounds Wounds: per nursing documentation    ------------------------------------------------------------------------------------------------------------------------------------------    LABs:  CBC Latest Ref Rng & Units 12/05/2019 12/04/2019 10/10/2019  WBC 4.0 - 10.5 K/uL 5.4 7.2 8.8  Hemoglobin 12.0 - 15.0 g/dL 11.2(L) 11.1(L) 11.0(L)  Hematocrit 36 - 46 % 37.0 36.6 36.4  Platelets 150 - 400 K/uL 186 211 179   CMP Latest Ref Rng & Units 12/05/2019 12/04/2019 10/10/2019  Glucose 70 - 99 mg/dL 135(H) 114(H) 104(H)  BUN 8 - 23 mg/dL 10 9 21   Creatinine 0.44 - 1.00 mg/dL 0.86 0.88 1.01(H)  Sodium 135 - 145 mmol/L 138 136 140  Potassium 3.5 - 5.1 mmol/L 3.4(L) 3.5 4.4  Chloride 98 - 111 mmol/L 99 98 106  CO2 22 - 32 mmol/L 27 26 29   Calcium 8.9 - 10.3 mg/dL 8.6(L) 8.7(L) 8.6(L)  Total Protein 6.5 - 8.1 g/dL 6.6 - -  Total Bilirubin 0.3 - 1.2 mg/dL 0.9 - -  Alkaline Phos 38 - 126 U/L 65 - -  AST 15 - 41 U/L 17 - -  ALT 0 - 44 U/L 12 - -       Micro Results Recent Results (from the past 240 hour(s))  SARS Coronavirus 2 by RT PCR (hospital order, performed in [place] hospital lab) Nasopharyngeal Nasopharyngeal Swab     Status: None   Collection Time: 12/04/19 10:40 PM   Specimen: Nasopharyngeal Swab  Result Value Ref Range Status   SARS Coronavirus 2  NEGATIVE NEGATIVE Final    Comment: (NOTE) SARS-CoV-2 target nucleic acids are NOT DETECTED.  The SARS-CoV-2 RNA is generally detectable in upper and lower respiratory specimens during the acute phase of infection. The lowest conc Performed at [facility], [address], [place], [place] [address]     Radiology Reports DG Chest Portable 1 View  Result Date: 12/04/2019 CLINICAL DATA:  Cough. Shortness of breath. Bilateral lower extremity swelling. EXAM: PORTABLE CHEST 1 VIEW COMPARISON:  Radiograph 10/06/2019. FINDINGS: Cardiomegaly, equivocal increased from prior exam. Unchanged mediastinal contours. Aortic atherosclerosis. Progressive interstitial edema from prior exam. Suspected small pleural effusions. Linear atelectasis in the left mid lung zone. No confluent airspace disease. No pneumothorax. Mild hyperinflation. No acute osseous abnormalities are seen. IMPRESSION: Cardiomegaly with interstitial edema and probable small pleural effusions. Findings consistent with CHF. Aortic Atherosclerosis (ICD10-I70.0). Electronically Signed   By: Keith Rake M.D.   On: 12/04/2019 22:14    SIGNED: Deatra James, MD, FACP, FHM. [facility],  Pager (please use [email or web address] to page/text)  If 7PM-7AM, please contact night-coverage [email or web address], Password [facility] 12/05/2019, 10:10 AM

## 2019-12-05 NOTE — Evaluation (Signed)
Physical Therapy Evaluation Only Patient Details Name: [name] MRN: [id number] DOB: 09-29-1945 Today's Date: 12/05/2019   History of Present Illness  74 y.o. female, with history of hypertension, hyperlipidemia, GERD, chronic systolic and diastolic CHF , COPD came to hospital with worsening shortness of breath.  Patient had echocardiogram and left heart catheterization in April 2021 and first week of May 20 21.  Echocardiogram showed EF 20% with severely decreased left function, moderate LVH.  Her heart catheterization performed several days later showed mild to moderate nonobstructive CAD.  She did have aneurysm of proximal mid RCA which seemed larger than 2010, normal right and left heart filling pressures, mild pulmonary hypertension.Patient says that she has been having worsening shortness of breath over the past few days.  Also coughing up clear phlegm.  Clinical Impression  Physical therapy evaluation completed, patient is at baseline and no further PT services recommended at this time. Pt ambulates throughout room and in hallway without AD, no unsteadiness noted, on RA with SpO2 97-94% and no increased work of breathing. Pt declines further ambulation due to "I just feel tired" so returned to room, pt supine in bed with RN in room to administer medication. Patient discharged to care of nursing for ambulation daily as tolerated for length of stay.      Follow Up Recommendations No PT follow up    Equipment Recommendations  None recommended by PT    Recommendations for Other Services       Precautions / Restrictions Precautions Precautions: None Restrictions Weight Bearing Restrictions: No      Mobility  Bed Mobility Overal bed mobility: Independent   Transfers Overall transfer level: Independent Equipment used: None  General transfer comment: no UE assist to rise, good steadiness  Ambulation/Gait Ambulation/Gait assistance: Independent Gait Distance (Feet): 110  Feet Assistive device: None Gait Pattern/deviations: WFL(Within Functional Limits) Gait velocity: WNL   General Gait Details: Pt ambualtes in hallway and room, clearing past obstacles at safe distance, good steadiness with turns, limited in distance 2* "I just feel tired", on RA with SpO2 97-94% and no increased work of breathing  Financial trader Rankin (Stroke Patients Only)       Balance Overall balance assessment: Independent           Pertinent Vitals/Pain Pain Assessment: No/denies pain    Home Living Family/patient expects to be discharged to:: Private residence Living Arrangements: Children;Other relatives (daughter and adult grandson) Available Help at Discharge: Family;Available 24 hours/day Type of Home: Mobile home Home Access: Level entry     Home Layout: One level Home Equipment: None      Prior Function Level of Independence: Independent         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, but recently more fatigued and ambulating household distances; drives, Ind with ADLs, daughter or grandson are always home with pt     Hand Dominance        Extremity/Trunk Assessment   Upper Extremity Assessment Upper Extremity Assessment: Overall WFL for tasks assessed    Lower Extremity Assessment Lower Extremity Assessment: Overall WFL for tasks assessed (AROM WNL, strength 5/5, denies numbness/tingling)    Cervical / Trunk Assessment Cervical / Trunk Assessment: Normal  Communication   Communication: HOH  Cognition Arousal/Alertness: Awake/alert Behavior During Therapy: WFL for tasks assessed/performed Overall Cognitive Status: Within Functional Limits for tasks assessed       General Comments  Exercises     Assessment/Plan    PT Assessment Patent does not need any further PT services  PT Problem List         PT Treatment Interventions      PT Goals (Current goals can be found in the  Care Plan section)  Acute Rehab PT Goals Patient Stated Goal: go home PT Goal Formulation: With patient Time For Goal Achievement: 12/12/19 Potential to Achieve Goals: Good    Frequency     Barriers to discharge        Co-evaluation               AM-PAC PT "6 Clicks" Mobility  Outcome Measure Help needed turning from your back to your side while in a flat bed without using bedrails?: None Help needed moving from lying on your back to sitting on the side of a flat bed without using bedrails?: None Help needed moving to and from a bed to a chair (including a wheelchair)?: None Help needed standing up from a chair using your arms (e.g., wheelchair or bedside chair)?: None Help needed to walk in hospital room?: None Help needed climbing 3-5 steps with a railing? : None 6 Click Score: 24    End of Session   Activity Tolerance: Patient tolerated treatment well Patient left: in bed;with call bell/phone within reach;with nursing/sitter in room Nurse Communication: Mobility status;Other (comment) (O2) PT Visit Diagnosis: Other abnormalities of gait and mobility (R26.89)    Time: 5189-8421 PT Time Calculation (min) (ACUTE ONLY): 12 min   Charges:   PT Evaluation $PT Eval Low Complexity: 1 Low           Tori Manna Gose PT, DPT 12/05/19, 12:11 PM [phone]

## 2019-12-05 NOTE — TOC Initial Note (Signed)
Transition of Care [facility]) - Initial/Assessment Note   Patient Details  Name: [name] MRN: [id number] Date of Birth: 27-Dec-1945  Transition of [facility] CM/SW Contact:    Sherie Don, LCSW Phone Number: 12/05/2019, 10:48 AM  Clinical Narrative: Patient is a 74 year old female who was admitted for acute respiratory failure with hypoxemia. TOC received consult for medication assistance. Patient currently has Medicare [place] met with patient to complete initial assessment. Per patient, she lives at home with her daughter, Idora Brosious, and does not have any DME at home. Patient reported she also does not have a history of receiving [place] services. CSW asked about patient's ability to afford medications. Patient reported she is able to afford her medications although "it's difficult" at times. Patient reported some of her medications are generic and she picks up all her medications. TOC to follow.  Expected Discharge Plan: Home/Self Care Barriers to Discharge: Continued Medical Work up  Patient Goals and CMS Choice Patient states their goals for this hospitalization and ongoing recovery are:: Discharge home Choice offered to / list presented to : NA  Expected Discharge Plan and Services Expected Discharge Plan: Home/Self Care In-house Referral: Clinical Social Work Discharge Planning Services: NA Post Acute Care Choice: NA Living arrangements for the past 2 months: Mobile Home             DME Arranged: N/A DME Agency: NA HH Arranged: NA [place] Agency: NA  Prior Living Arrangements/Services Living arrangements for the past 2 months: Mobile Home Lives with:: Adult Children (Xara Paulding (daughter)) Patient language and need for interpreter reviewed:: Yes Do you feel safe going back to the place where you live?: Yes Need for Family Participation in Patient Care: No (Comment) Care giver support system in place?: Yes (comment) Voncille Lo (daughter)) Criminal Activity/Legal  Involvement Pertinent to Current Situation/Hospitalization: No - Comment as needed  Emotional Assessment Appearance:: Appears stated age Attitude/Demeanor/Rapport: Engaged Affect (typically observed): Accepting, Appropriate Orientation: : Oriented to Self, Oriented to Place, Oriented to  Time, Oriented to Situation Alcohol / Substance Use: Tobacco Use Psych Involvement: No (comment)  Admission diagnosis:  Acute pulmonary edema (HCC) [J81.0] Acute respiratory failure with hypoxemia (HCC) [J96.01] Acute on chronic congestive heart failure, unspecified heart failure type [facility]) [I50.9] Patient Active Problem List   Diagnosis Date Noted  . Acute respiratory failure with hypoxemia ([place]) 12/05/2019  . COPD exacerbation ([place]) 10/06/2019  . Acute systolic CHF (congestive heart failure) ([place]) 10/06/2019  . Acute systolic congestive heart failure ([place])   . COPD (chronic obstructive pulmonary disease) ([place])   . MITRAL REGURGITATION 03/26/2009  . Coronary atherosclerosis 03/26/2009  . Bilateral carotid bruits 03/26/2009  . Tobacco abuse 02/23/2009  . DEPRESSION 09/04/2007  . CONGESTIVE HEART FAILURE 09/04/2007  . GERD 09/04/2007  . OSTEOARTHRITIS 09/04/2007  . LOW BACK PAIN 09/04/2007  . Hyperlipidemia 09/02/2007  . Hypertension 09/02/2007  . Chronic diastolic heart failure ([place]) 09/02/2007  . COPD with chronic bronchitis ([place]) 09/02/2007  . URINARY INCONTINENCE, URGE 09/02/2007   PCP:  Patient, No Pcp Per Pharmacy:   [facility] [address] [place] [address] Phone: [phone] Fax: [place], [place] [address] [place] [address] Phone: [phone] Fax: [phone]  Readmission Risk Interventions No flowsheet data found.

## 2019-12-06 LAB — BASIC METABOLIC PANEL
Anion gap: 13 (ref 5–15)
BUN: 21 mg/dL (ref 8–23)
CO2: 28 mmol/L (ref 22–32)
Calcium: 8.5 mg/dL — ABNORMAL LOW (ref 8.9–10.3)
Chloride: 96 mmol/L — ABNORMAL LOW (ref 98–111)
Creatinine, Ser: 1.06 mg/dL — ABNORMAL HIGH (ref 0.44–1.00)
GFR calc Af Amer: 59 mL/min — ABNORMAL LOW (ref >60)
GFR calc non Af Amer: 51 mL/min — ABNORMAL LOW (ref >60)
Glucose, Bld: 152 mg/dL — ABNORMAL HIGH (ref 70–99)
Potassium: 3.4 mmol/L — ABNORMAL LOW (ref 3.5–5.1)
Sodium: 137 mmol/L (ref 135–145)

## 2019-12-06 LAB — CBC
HCT: 33.2 % — ABNORMAL LOW (ref 36.0–46.0)
Hemoglobin: 10.3 g/dL — ABNORMAL LOW (ref 12.0–15.0)
MCH: 27.6 pg (ref 26.0–34.0)
MCHC: 31 g/dL (ref 30.0–36.0)
MCV: 89 fL (ref 80.0–100.0)
Platelets: 175 10*3/uL (ref 150–400)
RBC: 3.73 MIL/uL — ABNORMAL LOW (ref 3.87–5.11)
RDW: 15.7 % — ABNORMAL HIGH (ref 11.5–15.5)
WBC: 8 10*3/uL (ref 4.0–10.5)
nRBC: 0 % (ref 0.0–0.2)

## 2019-12-06 LAB — BRAIN NATRIURETIC PEPTIDE: B Natriuretic Peptide: 3718 pg/mL — ABNORMAL HIGH (ref 0.0–100.0)

## 2019-12-06 MED ORDER — ALPRAZOLAM 0.5 MG PO TABS
0.5000 mg | ORAL_TABLET | Freq: Once | ORAL | Status: AC
  Administered 2019-12-06: 0.5 mg via ORAL
  Filled 2019-12-06: qty 1

## 2019-12-06 MED ORDER — ALBUTEROL SULFATE (2.5 MG/3ML) 0.083% IN NEBU
2.5000 mg | INHALATION_SOLUTION | RESPIRATORY_TRACT | Status: DC | PRN
  Administered 2019-12-07 – 2019-12-08 (×3): 2.5 mg via RESPIRATORY_TRACT
  Filled 2019-12-06 (×3): qty 3

## 2019-12-06 MED ORDER — POTASSIUM CHLORIDE CRYS ER 20 MEQ PO TBCR
40.0000 meq | EXTENDED_RELEASE_TABLET | Freq: Four times a day (QID) | ORAL | Status: AC
  Administered 2019-12-06: 40 meq via ORAL
  Filled 2019-12-06: qty 2

## 2019-12-06 MED ORDER — PREDNISONE 20 MG PO TABS
40.0000 mg | ORAL_TABLET | Freq: Every day | ORAL | Status: DC
  Filled 2019-12-06: qty 2

## 2019-12-06 MED ORDER — METHYLPREDNISOLONE SODIUM SUCC 125 MG IJ SOLR
60.0000 mg | Freq: Two times a day (BID) | INTRAMUSCULAR | Status: AC
  Administered 2019-12-06: 60 mg via INTRAVENOUS
  Filled 2019-12-06: qty 2

## 2019-12-06 MED ORDER — IPRATROPIUM-ALBUTEROL 0.5-2.5 (3) MG/3ML IN SOLN
3.0000 mL | Freq: Four times a day (QID) | RESPIRATORY_TRACT | Status: DC
  Administered 2019-12-06 – 2019-12-09 (×12): 3 mL via RESPIRATORY_TRACT
  Filled 2019-12-06 (×12): qty 3

## 2019-12-06 NOTE — Progress Notes (Signed)
PT Cancellation Note  Patient Details Name: [name] MRN: [id number] DOB: 08-03-1945   Cancelled Treatment:    Reason Eval/Treat Not Completed: PT screened, no needs identified, will sign off     3:24 PM, 12/06/19 Lonell Grandchild, MPT Physical Therapist with [facility] [phone] office [phone] mobile phone

## 2019-12-06 NOTE — Plan of Care (Signed)

## 2019-12-06 NOTE — Progress Notes (Signed)
PROGRESS NOTE    [name]  QIH:[id number] DOB: 1945/11/28 DOA: 12/04/2019 PCP: Patient, No Pcp Per   Chief Complaint  Patient presents with  . Shortness of Breath    Brief Narrative:  GlendaEdwardsis a74 y.o.female,with history of hypertension, hyperlipidemia, GERD, chronic systolic and diastolic CHF , COPD came to hospital with worsening shortness of breath.  Reported worsening shortness of breath at rest and with exertion over past few days.  Also having cough with clear phlegm.  Not O2 dependent at baseline.  Patient had echocardiogram and left heart catheterization in April 2021 and first week of May 2021, respectively. Echocardiogram showed EF 20% with severely decreased left function, moderate LVH. Her heart catheterization performed several days later showed mild to moderate nonobstructive CAD. 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  She's currently being treated for [name] COPD exacerbation and HF exacerbation, cardiology is following.  Assessment & Plan:   Active Problems:   Acute respiratory failure with hypoxemia (HCC)  Acute respiratory failure with hypoxemia -Shortness of breath hypoxemia seems to be multifactorial, including underlying CHF, COPD  -treat HF and COPD as noted below -she's been weaned to RA today, but continues to c/o dyspnea - CXR 12/07/19  Acute on chronic systolic and diastolic CHF - Echo 8/75/64 with EF 20%, RVSF mildly reduced, mildly elevated PASP, functional MR, moderate to severe MVR (see report) - cath from 10/2019 with mild to mod non obstructive CAD -> reduction in EF out of proportion to CAD -> c/w non ischemic cardiomyopathy (see report) - BNP up today - cardiology c/s, appreciate recommendations  - medications were an issue prior to admission - was discharged with coreg, lasix, entresto, and spironolactone - she was unable  to afford many of her meds (may have only been taking spironolactone?).  Per cards, instead of entresto, will likely start losartan if BP allows - continue coreg, spironolactone - elevated troponin is mild and likely related to demand ischemia  - I/O, daily weights  COPD exacerbation -continues to have some scattered faint wheezing today - continue solumedrol, transition to PO prednisone 12/07/19 - scheduled and prn nebs, levaquin x 5 days - appears she's only on albuterol at home - weaned to RA  Hypertension /hypotensive on admission - SBP 104 this am   - continue home BP meds as able   HLD: continue simvastatin  Carotid Artery Stenosis: s/p L CEA in 2010, on aspirin, plavix, statin  DVT prophylaxis: lovenox Code Status: full  Family Communication: none at bedside Disposition:   Status is: Inpatient  Remains inpatient appropriate because:Inpatient level of care appropriate due to severity of illness   Dispo: The patient is from: Home              Anticipated d/c is to: pending              Anticipated d/c date is: > 3 days              Patient currently is not medically stable to d/c.  Consultants:   cardiology  Procedures:   none  Antimicrobials:  Anti-infectives (From admission, onward)   Start     Dose/Rate Route Frequency Ordered Stop   12/05/19 1000  levofloxacin (LEVAQUIN) tablet 500 mg     Discontinue    Note to Pharmacy: COPD   500 mg Oral Daily 12/05/19 0723 12/10/19 0959  Subjective: Doesn't feel good Couldn't sleep well, SOB persistent Hard of hearing  Objective: Vitals:   12/05/19 1930 12/05/19 2014 12/06/19 0542 12/06/19 0804  BP:  (!) 107/55 (!) 104/47   Pulse:  65 76   Resp:  20 20   Temp:  98.8 F (37.1 C) 98 F (36.7 C)   TempSrc:  Oral Oral   SpO2: 94% 94% 98% 92%  Weight:      Height:        Intake/Output Summary (Last 24 hours) at 12/06/2019 1109 Last data filed at 12/05/2019 1900 Gross per 24 hour  Intake 240 ml    Output --  Net 240 ml   Filed Weights   12/04/19 2123  Weight: 63.5 kg    Examination:  General exam: Appears calm and comfortable  Respiratory system: scattered rhonchi and wheezing  Cardiovascular system: S1 & S2 heard, RRR.  Gastrointestinal system: Abdomen is nondistended, soft and nontender.  Central nervous system: Alert and oriented. No focal neurological deficits.  Hard of hearing. Extremities: bilateral LE edema Skin: No rashes, lesions or ulcers Psychiatry: Judgement and insight appear normal. Mood & affect appropriate.     Data Reviewed: I have personally reviewed following labs and imaging studies  CBC: Recent Labs  Lab 12/04/19 2135 12/05/19 0915 12/06/19 0442  WBC 7.2 5.4 8.0  HGB 11.1* 11.2* 10.3*  HCT 36.6 37.0 33.2*  MCV 89.3 89.8 89.0  PLT 211 186 527    Basic Metabolic Panel: Recent Labs  Lab 12/04/19 2135 12/05/19 0915 12/06/19 0442  NA 136 138 137  K 3.5 3.4* 3.4*  CL 98 99 96*  CO2 26 27 28   GLUCOSE 114* 135* 152*  BUN 9 10 21   CREATININE 0.88 0.86 1.06*  CALCIUM 8.7* 8.6* 8.5*    GFR: Estimated Creatinine Clearance: 43.1 mL/min ([name]) (by C-G formula based on SCr of 1.06 mg/dL (H)).  Liver Function Tests: Recent Labs  Lab 12/05/19 0915  AST 17  ALT 12  ALKPHOS 65  BILITOT 0.9  PROT 6.6  ALBUMIN 3.5    CBG: No results for input(s): GLUCAP in the last 168 hours.   Recent Results (from the past 240 hour(s))  SARS Coronavirus 2 by RT PCR (hospital order, performed in [facility] hospital lab) Nasopharyngeal Nasopharyngeal Swab     Status: None   Collection Time: 12/04/19 10:40 PM   Specimen: Nasopharyngeal Swab  Result Value Ref Range Status   SARS Coronavirus 2 NEGATIVE NEGATIVE Final    Comment: (NOTE) SARS-CoV-2 target nucleic acids are NOT DETECTED.  The SARS-CoV-2 RNA is generally detectable in upper and lower respiratory specimens during the acute phase of infection. The lowest concentration of SARS-CoV-2 viral  copies this assay can detect is 250 copies / mL. [name] negative result does not preclude SARS-CoV-2 infection and should not be used as the sole basis for treatment or other patient management decisions.  [name] negative result may occur with improper specimen collection / handling, submission of specimen other than nasopharyngeal swab, presence of viral mutation(s) within the areas targeted by this assay, and inadequate number of viral copies (<250 copies / mL). Gabbrielle Mcnicholas negative result must be combined with clinical observations, patient history, and epidemiological information.  Fact Sheet for Patients:   StrictlyIdeas.no  Fact Sheet for Healthcare Providers: BankingDealers.co.za  This test is not yet approved or  cleared by the [place] FDA and has been authorized for detection and/or diagnosis of SARS-CoV-2 by FDA under an Emergency Use Authorization (EUA).  This EUA will remain in effect (meaning this test can be used) for the duration of the COVID-19 declaration under Section 564(b)(1) of the Act, 21 U.S.C. section 360bbb-3(b)(1), unless the authorization is terminated or revoked sooner.  Performed at [facility], [address], [place], [place] [address]          Radiology Studies: DG Chest Portable 1 View  Result Date: 12/04/2019 CLINICAL DATA:  Cough. Shortness of breath. Bilateral lower extremity swelling. EXAM: PORTABLE CHEST 1 VIEW COMPARISON:  Radiograph 10/06/2019. FINDINGS: Cardiomegaly, equivocal increased from prior exam. Unchanged mediastinal contours. Aortic atherosclerosis. Progressive interstitial edema from prior exam. Suspected small pleural effusions. Linear atelectasis in the left mid lung zone. No confluent airspace disease. No pneumothorax. Mild hyperinflation. No acute osseous abnormalities are seen. IMPRESSION: Cardiomegaly with interstitial edema and probable small pleural effusions. Findings consistent with CHF.  Aortic Atherosclerosis (ICD10-I70.0). Electronically Signed   By: Keith Rake M.D.   On: 12/04/2019 22:14        Scheduled Meds: . aspirin EC  81 mg Oral Daily  . atorvastatin  40 mg Oral Daily  . carvedilol  6.25 mg Oral BID WC  . clopidogrel  75 mg Oral Daily  . enoxaparin (LOVENOX) injection  40 mg Subcutaneous Q24H  . furosemide  40 mg Intravenous BID  . guaiFENesin  600 mg Oral BID  . ipratropium-albuterol  3 mL Nebulization QID  . levofloxacin  500 mg Oral Daily  . methylPREDNISolone (SOLU-MEDROL) injection  60 mg Intravenous Q12H  . potassium chloride  40 mEq Oral Q6H  . [START ON 12/07/2019] predniSONE  40 mg Oral Q breakfast  . sodium chloride flush  3 mL Intravenous Q12H  . spironolactone  12.5 mg Oral Daily   Continuous Infusions: . sodium chloride       LOS: 1 day    Time spent: over 30 min    Fayrene Helper, MD [facility]   To contact the attending provider between 7A-7P or the covering provider during after hours 7P-7A, please log into the web site [email or web address] and access using universal [place] password for that web site. If you do not have the password, please call the hospital operator.  12/06/2019, 11:09 AM

## 2019-12-06 NOTE — Progress Notes (Signed)
Progress Note  Patient Name: [name] Date of Encounter: 12/06/2019  [facility] Cardiologist: Carlyle Dolly, MD   Subjective   SOB improving. Some fatigue this AM, troubles sleeping at night  Inpatient Medications    Scheduled Meds: . aspirin EC  81 mg Oral Daily  . atorvastatin  40 mg Oral Daily  . carvedilol  6.25 mg Oral BID WC  . clopidogrel  75 mg Oral Daily  . enoxaparin (LOVENOX) injection  40 mg Subcutaneous Q24H  . furosemide  40 mg Intravenous BID  . guaiFENesin  600 mg Oral BID  . ipratropium-albuterol  3 mL Nebulization BID  . levofloxacin  500 mg Oral Daily  . methylPREDNISolone (SOLU-MEDROL) injection  60 mg Intravenous Q6H  . sodium chloride flush  3 mL Intravenous Q12H  . spironolactone  12.5 mg Oral Daily   Continuous Infusions: . sodium chloride     PRN Meds: sodium chloride, ipratropium-albuterol, sodium chloride flush   Vital Signs    Vitals:   12/05/19 1930 12/05/19 2014 12/06/19 0542 12/06/19 0804  BP:  (!) 107/55 (!) 104/47   Pulse:  65 76   Resp:  20 20   Temp:  98.8 F (37.1 C) 98 F (36.7 C)   TempSrc:  Oral Oral   SpO2: 94% 94% 98% 92%  Weight:      Height:        Intake/Output Summary (Last 24 hours) at 12/06/2019 0925 Last data filed at 12/05/2019 1900 Gross per 24 hour  Intake 240 ml  Output --  Net 240 ml   Last 3 Weights 12/04/2019 10/10/2019 10/06/2019  Weight (lbs) 140 lb 136 lb 4.8 oz 144 lb 3.2 oz  Weight (kg) 63.504 kg 61.825 kg 65.409 kg      Telemetry    SR - Personally Reviewed  ECG    n/a - Personally Reviewed  Physical Exam   GEN: No acute distress.   Neck: No JVD Cardiac: RRR, no murmurs, rubs, or gallops.  Respiratory: faint crackles bilaterally GI: Soft, nontender, non-distended  MS: 1+ bilateral LE edema; No deformity. Neuro:  Nonfocal  Psych: Normal affect   Labs    High Sensitivity Troponin:   Recent Labs  Lab 12/04/19 2135 12/04/19 2355  TROPONINIHS 19* 24*       Chemistry Recent Labs  Lab 12/04/19 2135 12/05/19 0915 12/06/19 0442  NA 136 138 137  K 3.5 3.4* 3.4*  CL 98 99 96*  CO2 26 27 28   GLUCOSE 114* 135* 152*  BUN 9 10 21   CREATININE 0.88 0.86 1.06*  CALCIUM 8.7* 8.6* 8.5*  PROT  --  6.6  --   ALBUMIN  --  3.5  --   AST  --  17  --   ALT  --  12  --   ALKPHOS  --  65  --   BILITOT  --  0.9  --   GFRNONAA >60 >60 51*  GFRAA >60 >60 59*  ANIONGAP 12 12 13      Hematology Recent Labs  Lab 12/04/19 2135 12/05/19 0915 12/06/19 0442  WBC 7.2 5.4 8.0  RBC 4.10 4.12 3.73*  HGB 11.1* 11.2* 10.3*  HCT 36.6 37.0 33.2*  MCV 89.3 89.8 89.0  MCH 27.1 27.2 27.6  MCHC 30.3 30.3 31.0  RDW 15.8* 15.6* 15.7*  PLT 211 186 175    BNP Recent Labs  Lab 12/04/19 2135 12/06/19 0442  BNP 2,430.0* 3,718.0*     DDimer No results  for input(s): DDIMER in the last 168 hours.   Radiology    DG Chest Portable 1 View  Result Date: 12/04/2019 CLINICAL DATA:  Cough. Shortness of breath. Bilateral lower extremity swelling. EXAM: PORTABLE CHEST 1 VIEW COMPARISON:  Radiograph 10/06/2019. FINDINGS: Cardiomegaly, equivocal increased from prior exam. Unchanged mediastinal contours. Aortic atherosclerosis. Progressive interstitial edema from prior exam. Suspected small pleural effusions. Linear atelectasis in the left mid lung zone. No confluent airspace disease. No pneumothorax. Mild hyperinflation. No acute osseous abnormalities are seen. IMPRESSION: Cardiomegaly with interstitial edema and probable small pleural effusions. Findings consistent with CHF. Aortic Atherosclerosis (ICD10-I70.0). Electronically Signed   By: Keith Rake M.D.   On: 12/04/2019 22:14    Cardiac Studies     Patient Profile       [name] is a 74 y.o. female with past medical history of chronic systolic CHF (EF 74% by echocardiogram in 09/2019), CAD (s/p BMS to RCA in 2010, cath in 09/2019 showing mild to moderate disease and patent RCA stent with minimal  ISR), HTN, HLD, carotid artery stenosis (s/p L CEA in 2010), moderate to severe MR, COPD and history of breast cancer who is being seen today for the evaluation of CHF at the request of Dr. Roger Shelter.   Assessment & Plan    1. Acute on chronic systolic HF - chronic sysotlic HF LVEF 08% diagonsed 09/2019  - admitted with volume overloaded due to medication nonadherence, issues affording meds -  Dry weight during 10/2019 admission when filling pressures were normal by cath was 136 lbs, admitted at 140 lbs  - I/Os are incomplete. Daily weight pending. BNP has trended higher.  - she is on IV lasix 40mg  bid. Uptrend in Cr. Despite uptrend would continue IV lasix today given ongoing edema, potentially change to oral tomorrow pending labs.  - medical therapy with coreg 6.25mg  bid, aldactone 12.5mg  daily. Soft bp's at times, not on ACE/ARB/ARNI. ARNI cost will be an issue unless approved for assistance.    2. Hypokalemia - replace with 24mEq PO x 2 today  3.History of CEA - has been on plavix regarding this it appears, not for a cardiac reason  For questions or updates, please contact [place] Please consult [email or web address] for contact info under        Signed, Carlyle Dolly, MD  12/06/2019, 9:25 AM

## 2019-12-07 ENCOUNTER — Inpatient Hospital Stay (HOSPITAL_COMMUNITY): Payer: Medicare Other

## 2019-12-07 DIAGNOSIS — I340 Nonrheumatic mitral (valve) insufficiency: Secondary | ICD-10-CM

## 2019-12-07 LAB — COMPREHENSIVE METABOLIC PANEL
ALT: 12 U/L (ref 0–44)
AST: 16 U/L (ref 15–41)
Albumin: 3.5 g/dL (ref 3.5–5.0)
Alkaline Phosphatase: 55 U/L (ref 38–126)
Anion gap: 12 (ref 5–15)
BUN: 42 mg/dL — ABNORMAL HIGH (ref 8–23)
CO2: 29 mmol/L (ref 22–32)
Calcium: 8.8 mg/dL — ABNORMAL LOW (ref 8.9–10.3)
Chloride: 97 mmol/L — ABNORMAL LOW (ref 98–111)
Creatinine, Ser: 1.33 mg/dL — ABNORMAL HIGH (ref 0.44–1.00)
GFR calc Af Amer: 45 mL/min — ABNORMAL LOW (ref >60)
GFR calc non Af Amer: 39 mL/min — ABNORMAL LOW (ref >60)
Glucose, Bld: 141 mg/dL — ABNORMAL HIGH (ref 70–99)
Potassium: 4.3 mmol/L (ref 3.5–5.1)
Sodium: 138 mmol/L (ref 135–145)
Total Bilirubin: 0.7 mg/dL (ref 0.3–1.2)
Total Protein: 6.4 g/dL — ABNORMAL LOW (ref 6.5–8.1)

## 2019-12-07 LAB — MAGNESIUM: Magnesium: 2.2 mg/dL (ref 1.7–2.4)

## 2019-12-07 LAB — CBC WITH DIFFERENTIAL/PLATELET
Abs Immature Granulocytes: 0.07 10*3/uL (ref 0.00–0.07)
Basophils Absolute: 0 10*3/uL (ref 0.0–0.1)
Basophils Relative: 0 %
Eosinophils Absolute: 0 10*3/uL (ref 0.0–0.5)
Eosinophils Relative: 0 %
HCT: 35.9 % — ABNORMAL LOW (ref 36.0–46.0)
Hemoglobin: 10.8 g/dL — ABNORMAL LOW (ref 12.0–15.0)
Immature Granulocytes: 1 %
Lymphocytes Relative: 5 %
Lymphs Abs: 0.7 10*3/uL (ref 0.7–4.0)
MCH: 27.1 pg (ref 26.0–34.0)
MCHC: 30.1 g/dL (ref 30.0–36.0)
MCV: 90 fL (ref 80.0–100.0)
Monocytes Absolute: 0.3 10*3/uL (ref 0.1–1.0)
Monocytes Relative: 2 %
Neutro Abs: 12 10*3/uL — ABNORMAL HIGH (ref 1.7–7.7)
Neutrophils Relative %: 92 %
Platelets: 205 10*3/uL (ref 150–400)
RBC: 3.99 MIL/uL (ref 3.87–5.11)
RDW: 16.1 % — ABNORMAL HIGH (ref 11.5–15.5)
WBC: 13 10*3/uL — ABNORMAL HIGH (ref 4.0–10.5)
nRBC: 0 % (ref 0.0–0.2)

## 2019-12-07 LAB — BRAIN NATRIURETIC PEPTIDE: B Natriuretic Peptide: 2876 pg/mL — ABNORMAL HIGH (ref 0.0–100.0)

## 2019-12-07 LAB — ECHOCARDIOGRAM LIMITED
Height: 66.5 in
Weight: 2229.29 oz

## 2019-12-07 LAB — PHOSPHORUS: Phosphorus: 3.8 mg/dL (ref 2.5–4.6)

## 2019-12-07 MED ORDER — BUDESONIDE 0.25 MG/2ML IN SUSP
0.2500 mg | Freq: Two times a day (BID) | RESPIRATORY_TRACT | Status: DC
  Administered 2019-12-07 – 2019-12-09 (×4): 0.25 mg via RESPIRATORY_TRACT
  Filled 2019-12-07 (×4): qty 2

## 2019-12-07 MED ORDER — METHYLPREDNISOLONE SODIUM SUCC 125 MG IJ SOLR
60.0000 mg | Freq: Two times a day (BID) | INTRAMUSCULAR | Status: DC
  Administered 2019-12-07 (×2): 60 mg via INTRAVENOUS
  Filled 2019-12-07 (×3): qty 2

## 2019-12-07 NOTE — Progress Notes (Addendum)
PROGRESS NOTE    [name]  GTX:[id number] DOB: [birth year] DOA: 12/04/2019 PCP: Patient, No Pcp Per   Chief Complaint  Patient presents with  . Shortness of Breath    Brief Narrative:  GlendaEdwardsis a74 y.o.female,with history of hypertension, hyperlipidemia, GERD, chronic systolic and diastolic CHF , COPD came to hospital with worsening shortness of breath.  Reported worsening shortness of breath at rest and with exertion over past few days.  Also having cough with clear phlegm.  Not O2 dependent at baseline.  Patient had echocardiogram and left heart catheterization in April 2021 and first week of May 2021, respectively. Echocardiogram showed EF 20% with severely decreased left function, moderate LVH. Her heart catheterization performed several days later showed mild to moderate nonobstructive CAD. 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  She's currently being treated for [name] COPD exacerbation and HF exacerbation, cardiology is following.  Assessment & Plan:   Active Problems:   Acute respiratory failure with hypoxemia (HCC)  Acute respiratory failure with hypoxemia -Shortness of breath hypoxemia seems to be multifactorial, including underlying CHF, COPD  -treat HF and COPD as noted below -she's been weaned to RA today, but continues to c/o dyspnea - CXR 12/07/19 -> interstitial pulm edema, cardiopericardial enlargement -> follow limited echo for effusion  Acute on chronic systolic and diastolic CHF - Echo 2/48/25 with EF 20%, RVSF mildly reduced, mildly elevated PASP, functional MR, moderate to severe MVR (see report) - cath from 10/2019 with mild to mod non obstructive CAD -> reduction in EF out of proportion to CAD -> c/w non ischemic cardiomyopathy (see report) - BNP down today - creatinine up today, lasix on hold today - cardiology c/s, appreciate  recommendations  - medications were an issue prior to admission - was discharged with coreg, lasix, entresto, and spironolactone - she was unable to afford many of her meds (may have only been taking spironolactone?).  Per cards, instead of entresto, will likely start losartan if BP allows - continue coreg, spironolactone - elevated troponin is mild and likely related to demand ischemia  - I/O, daily weights  COPD exacerbation - continued wheezing today - transition back to IV solumedrol, add budesonide - scheduled and prn nebs, levaquin x 5 days - appears she's only on albuterol at home - wean as tolerated  Hypertension /hypotensive on admission - SBP 104 this am   - continue home BP meds as able   HLD: continue simvastatin  Carotid Artery Stenosis: s/p L CEA in 2010, on aspirin, plavix, statin  DVT prophylaxis: lovenox Code Status: full  Family Communication: none at bedside - discussed with daughter over phone Disposition:   Status is: Inpatient  Remains inpatient appropriate because:Inpatient level of care appropriate due to severity of illness   Dispo: The patient is from: Home              Anticipated d/c is to: pending              Anticipated d/c date is: > 3 days              Patient currently is not medically stable to d/c.  Consultants:   cardiology  Procedures:   none  Antimicrobials:  Anti-infectives (From admission, onward)   Start     Dose/Rate Route Frequency Ordered Stop   12/05/19 1000  levofloxacin (LEVAQUIN) tablet 500 mg     Discontinue  Note to Pharmacy: COPD   500 mg Oral Daily 12/05/19 0723 12/10/19 0959      Subjective: Continues to not feel well  Objective: Vitals:   12/07/19 0416 12/07/19 0430 12/07/19 0447 12/07/19 0825  BP:   119/63   Pulse:   87   Resp:   (!) 22   Temp:   97.8 F (36.6 C)   TempSrc:   Oral   SpO2: 96% 96% 99% 98%  Weight:      Height:        Intake/Output Summary (Last 24 hours) at 12/07/2019  1024 Last data filed at 12/06/2019 2105 Gross per 24 hour  Intake 150 ml  Output --  Net 150 ml   Filed Weights   12/04/19 2123  Weight: 63.5 kg    Examination:  General: No acute distress. Cardiovascular: Heart sounds show [name] regular rate, and rhythm.  Lungs: wheezing diffusely Abdomen: Soft, nontender, nondistended Neurological: Alert and oriented 3. Moves all extremities 4. Cranial nerves II through XII grossly intact. Skin: Warm and dry. No rashes or lesions. Extremities: 1+ LE edema    Data Reviewed: I have personally reviewed following labs and imaging studies  CBC: Recent Labs  Lab 12/04/19 2135 12/05/19 0915 12/06/19 0442 12/07/19 0616  WBC 7.2 5.4 8.0 13.0*  NEUTROABS  --   --   --  12.0*  HGB 11.1* 11.2* 10.3* 10.8*  HCT 36.6 37.0 33.2* 35.9*  MCV 89.3 89.8 89.0 90.0  PLT 211 186 175 540    Basic Metabolic Panel: Recent Labs  Lab 12/04/19 2135 12/05/19 0915 12/06/19 0442 12/07/19 0616  NA 136 138 137 138  K 3.5 3.4* 3.4* 4.3  CL 98 99 96* 97*  CO2 26 27 28 29   GLUCOSE 114* 135* 152* 141*  BUN 9 10 21  42*  CREATININE 0.88 0.86 1.06* 1.33*  CALCIUM 8.7* 8.6* 8.5* 8.8*  MG  --   --   --  2.2  PHOS  --   --   --  3.8    GFR: Estimated Creatinine Clearance: 34.4 mL/min ([name]) (by C-G formula based on SCr of 1.33 mg/dL (H)).  Liver Function Tests: Recent Labs  Lab 12/05/19 0915 12/07/19 0616  AST 17 16  ALT 12 12  ALKPHOS 65 55  BILITOT 0.9 0.7  PROT 6.6 6.4*  ALBUMIN 3.5 3.5    CBG: No results for input(s): GLUCAP in the last 168 hours.   Recent Results (from the past 240 hour(s))  SARS Coronavirus 2 by RT PCR (hospital order, performed in [facility] hospital lab) Nasopharyngeal Nasopharyngeal Swab     Status: None   Collection Time: 12/04/19 10:40 PM   Specimen: Nasopharyngeal Swab  Result Value Ref Range Status   SARS Coronavirus 2 NEGATIVE NEGATIVE Final    Comment: (NOTE) SARS-CoV-2 target nucleic acids are NOT  DETECTED.  The SARS-CoV-2 RNA is generally detectable in upper and lower respiratory specimens during the acute phase of infection. The lowest concentration of SARS-CoV-2 viral copies this assay can detect is 250 copies / mL. [name] negative result does not preclude SARS-CoV-2 infection and should not be used as the sole basis for treatment or other patient management decisions.  Wenzel Backlund negative result may occur with improper specimen collection / handling, submission of specimen other than nasopharyngeal swab, presence of viral mutation(s) within the areas targeted by this assay, and inadequate number of viral copies (<250 copies / mL). Anali Cabanilla negative result must be combined with clinical observations, patient  history, and epidemiological information.  Fact Sheet for Patients:   StrictlyIdeas.no  Fact Sheet for Healthcare Providers: BankingDealers.co.za  This test is not yet approved or  cleared by the [place] FDA and has been authorized for detection and/or diagnosis of SARS-CoV-2 by FDA under an Emergency Use Authorization (EUA).  This EUA will remain in effect (meaning this test can be used) for the duration of the COVID-19 declaration under Section 564(b)(1) of the Act, 21 U.S.C. section 360bbb-3(b)(1), unless the authorization is terminated or revoked sooner.  Performed at [facility], [address], [place], [place] [address]          Radiology Studies: DG CHEST PORT 1 VIEW  Result Date: 12/07/2019 CLINICAL DATA:  Hypoxia EXAM: PORTABLE CHEST 1 VIEW COMPARISON:  12/04/2019 FINDINGS: Hazy increased density on the left attributed to patient's left-sided breast implant. There is interlobular septal thickening on both sides. Cardiopericardial enlargement. Heavily calcified aorta IMPRESSION: 1. Interstitial pulmonary edema. 2. Cardiopericardial enlargement. The mediastinal contours raise the possibility of Estalene Bergey pericardial effusion.  Electronically Signed   By: Monte Fantasia M.D.   On: 12/07/2019 05:12        Scheduled Meds: . aspirin EC  81 mg Oral Daily  . atorvastatin  40 mg Oral Daily  . budesonide (PULMICORT) nebulizer solution  0.25 mg Nebulization BID  . carvedilol  6.25 mg Oral BID WC  . clopidogrel  75 mg Oral Daily  . guaiFENesin  600 mg Oral BID  . ipratropium-albuterol  3 mL Nebulization QID  . levofloxacin  500 mg Oral Daily  . methylPREDNISolone (SOLU-MEDROL) injection  60 mg Intravenous Q12H  . sodium chloride flush  3 mL Intravenous Q12H  . spironolactone  12.5 mg Oral Daily   Continuous Infusions: . sodium chloride       LOS: 2 days    Time spent: over 30 min    Fayrene Helper, MD [facility]   To contact the attending provider between 7A-7P or the covering provider during after hours 7P-7A, please log into the web site [email or web address] and access using universal [place] password for that web site. If you do not have the password, please call the hospital operator.  12/07/2019, 10:24 AM

## 2019-12-07 NOTE — Progress Notes (Signed)
*  PRELIMINARY RESULTS* Echocardiogram 2D Echocardiogram LIMITED has been performed.  Leavy Cella 12/07/2019, 12:27 PM

## 2019-12-07 NOTE — Progress Notes (Signed)
Progress Note  Patient Name: [name] Date of Encounter: 12/07/2019  [facility] Cardiologist: Carlyle Dolly, MD   Subjective   Some ongoing SOB  Inpatient Medications    Scheduled Meds: . aspirin EC  81 mg Oral Daily  . atorvastatin  40 mg Oral Daily  . budesonide (PULMICORT) nebulizer solution  0.25 mg Nebulization BID  . carvedilol  6.25 mg Oral BID WC  . clopidogrel  75 mg Oral Daily  . guaiFENesin  600 mg Oral BID  . ipratropium-albuterol  3 mL Nebulization QID  . levofloxacin  500 mg Oral Daily  . methylPREDNISolone (SOLU-MEDROL) injection  60 mg Intravenous Q12H  . sodium chloride flush  3 mL Intravenous Q12H  . spironolactone  12.5 mg Oral Daily   Continuous Infusions: . sodium chloride     PRN Meds: sodium chloride, albuterol, sodium chloride flush   Vital Signs    Vitals:   12/07/19 0416 12/07/19 0430 12/07/19 0447 12/07/19 0825  BP:   119/63   Pulse:   87   Resp:   (!) 22   Temp:   97.8 F (36.6 C)   TempSrc:   Oral   SpO2: 96% 96% 99% 98%  Weight:      Height:        Intake/Output Summary (Last 24 hours) at 12/07/2019 0921 Last data filed at 12/06/2019 2105 Gross per 24 hour  Intake 150 ml  Output --  Net 150 ml   Last 3 Weights 12/04/2019 10/10/2019 10/06/2019  Weight (lbs) 140 lb 136 lb 4.8 oz 144 lb 3.2 oz  Weight (kg) 63.504 kg 61.825 kg 65.409 kg      Telemetry    SR- Personally Reviewed  ECG    n/a - Personally Reviewed  Physical Exam   GEN: No acute distress.   Neck: No JVD Cardiac: RRR, no murmurs, rubs, or gallops.  Respiratory: mild bilateral wheezign GI: Soft, nontender, non-distended  MS: trace bilateral edema; No deformity. Neuro:  Nonfocal  Psych: Normal affect   Labs    High Sensitivity Troponin:   Recent Labs  Lab 12/04/19 2135 12/04/19 2355  TROPONINIHS 19* 24*      Chemistry Recent Labs  Lab 12/05/19 0915 12/06/19 0442 12/07/19 0616  NA 138 137 138  K 3.4* 3.4* 4.3  CL 99 96* 97*   CO2 27 28 29   GLUCOSE 135* 152* 141*  BUN 10 21 42*  CREATININE 0.86 1.06* 1.33*  CALCIUM 8.6* 8.5* 8.8*  PROT 6.6  --  6.4*  ALBUMIN 3.5  --  3.5  AST 17  --  16  ALT 12  --  12  ALKPHOS 65  --  55  BILITOT 0.9  --  0.7  GFRNONAA >60 51* 39*  GFRAA >60 59* 45*  ANIONGAP 12 13 12      Hematology Recent Labs  Lab 12/05/19 0915 12/06/19 0442 12/07/19 0616  WBC 5.4 8.0 13.0*  RBC 4.12 3.73* 3.99  HGB 11.2* 10.3* 10.8*  HCT 37.0 33.2* 35.9*  MCV 89.8 89.0 90.0  MCH 27.2 27.6 27.1  MCHC 30.3 31.0 30.1  RDW 15.6* 15.7* 16.1*  PLT 186 175 205    BNP Recent Labs  Lab 12/04/19 2135 12/06/19 0442 12/07/19 0616  BNP 2,430.0* 3,718.0* 2,876.0*     DDimer No results for input(s): DDIMER in the last 168 hours.   Radiology    DG CHEST PORT 1 VIEW  Result Date: 12/07/2019 CLINICAL DATA:  Hypoxia EXAM: PORTABLE  CHEST 1 VIEW COMPARISON:  12/04/2019 FINDINGS: Hazy increased density on the left attributed to patient's left-sided breast implant. There is interlobular septal thickening on both sides. Cardiopericardial enlargement. Heavily calcified aorta IMPRESSION: 1. Interstitial pulmonary edema. 2. Cardiopericardial enlargement. The mediastinal contours raise the possibility of a pericardial effusion. Electronically Signed   By: Monte Fantasia M.D.   On: 12/07/2019 05:12    Cardiac Studies    Patient Profile     [name] a 74 y.o.femalewith past medical history of chronic systolic CHF (EF 09% by echocardiogram in 09/2019), CAD (s/p BMS to RCA in 2010, cath in 09/2019 showing mild to moderate disease and patent RCA stent with minimal ISR), HTN, HLD, carotid artery stenosis (s/p L CEA in 2010), moderate to severe MR, COPD and history of breast cancerwho is being seen today for the evaluation ofCHFat the request of Dr. Roger Shelter.   Assessment & Plan    1. Acute on chronic systolic HF - chronic sysotlic HF LVEF 47% diagonsed 09/2019  - admitted with volume  overloaded due to medication nonadherence, issues affording meds - Dry weight during 10/2019 admission when filling pressures were normal by cath was 136 lbs, admitted at 140 lbs  - I/Os are incomplete. Weights are labile and appear inaccurate.  - she is on IV lasix 40mg  bid. Significant uptrend in Cr, diuretics held today  - difficult to know where we are regarding diuresis given insufficent I/Os and weights, I have spoken with nursing today about this, have also asked for standing weight. Dry weight is around 136 lbs based on weight at time of RHC when filling pressures were normal. I think if 136 or less by weight would assume she is near euvolemia despite her BNP levels, by exam does not apper signisficant volume overloaded.  CXR still with some interstitial edema, BNP remains elevated but downtrend from prior  - medical therapy with coreg 6.25mg  bid, aldactone 12.5mg  daily. Soft bp's at times, not on ACE/ARB/ARNI. ARNI cost will be an issue unless approved for assistance.     2.History of CEA - has been on plavix regarding this it appears, not for a cardiac reason  3 . COPD  - per primary team   For questions or updates, please contact [facility] Please consult [email or web address] for contact info under        Signed, Carlyle Dolly, MD  12/07/2019, 9:21 AM

## 2019-12-07 NOTE — Progress Notes (Addendum)
Pt. Is complaining of difficulty of breathing, Increased O2 to 3lpm, SPO2 @ 99%. Jessica,RT notified to give breathing tx per order.

## 2019-12-08 DIAGNOSIS — J810 Acute pulmonary edema: Secondary | ICD-10-CM

## 2019-12-08 DIAGNOSIS — J441 Chronic obstructive pulmonary disease with (acute) exacerbation: Secondary | ICD-10-CM

## 2019-12-08 DIAGNOSIS — R0902 Hypoxemia: Secondary | ICD-10-CM | POA: Diagnosis present

## 2019-12-08 LAB — COMPREHENSIVE METABOLIC PANEL
ALT: 14 U/L (ref 0–44)
AST: 16 U/L (ref 15–41)
Albumin: 3.4 g/dL — ABNORMAL LOW (ref 3.5–5.0)
Alkaline Phosphatase: 49 U/L (ref 38–126)
Anion gap: 12 (ref 5–15)
BUN: 38 mg/dL — ABNORMAL HIGH (ref 8–23)
CO2: 29 mmol/L (ref 22–32)
Calcium: 8.9 mg/dL (ref 8.9–10.3)
Chloride: 97 mmol/L — ABNORMAL LOW (ref 98–111)
Creatinine, Ser: 1 mg/dL (ref 0.44–1.00)
GFR calc Af Amer: >60 mL/min (ref >60)
GFR calc non Af Amer: 55 mL/min — ABNORMAL LOW (ref >60)
Glucose, Bld: 142 mg/dL — ABNORMAL HIGH (ref 70–99)
Potassium: 3.9 mmol/L (ref 3.5–5.1)
Sodium: 138 mmol/L (ref 135–145)
Total Bilirubin: 0.4 mg/dL (ref 0.3–1.2)
Total Protein: 6.3 g/dL — ABNORMAL LOW (ref 6.5–8.1)

## 2019-12-08 LAB — CBC WITH DIFFERENTIAL/PLATELET
Abs Immature Granulocytes: 0.06 10*3/uL (ref 0.00–0.07)
Basophils Absolute: 0 10*3/uL (ref 0.0–0.1)
Basophils Relative: 0 %
Eosinophils Absolute: 0 10*3/uL (ref 0.0–0.5)
Eosinophils Relative: 0 %
HCT: 34.4 % — ABNORMAL LOW (ref 36.0–46.0)
Hemoglobin: 10.4 g/dL — ABNORMAL LOW (ref 12.0–15.0)
Immature Granulocytes: 1 %
Lymphocytes Relative: 7 %
Lymphs Abs: 0.6 10*3/uL — ABNORMAL LOW (ref 0.7–4.0)
MCH: 27.5 pg (ref 26.0–34.0)
MCHC: 30.2 g/dL (ref 30.0–36.0)
MCV: 91 fL (ref 80.0–100.0)
Monocytes Absolute: 0.3 10*3/uL (ref 0.1–1.0)
Monocytes Relative: 3 %
Neutro Abs: 8 10*3/uL — ABNORMAL HIGH (ref 1.7–7.7)
Neutrophils Relative %: 89 %
Platelets: 185 10*3/uL (ref 150–400)
RBC: 3.78 MIL/uL — ABNORMAL LOW (ref 3.87–5.11)
RDW: 16.2 % — ABNORMAL HIGH (ref 11.5–15.5)
WBC: 9 10*3/uL (ref 4.0–10.5)
nRBC: 0 % (ref 0.0–0.2)

## 2019-12-08 LAB — MAGNESIUM: Magnesium: 2.5 mg/dL — ABNORMAL HIGH (ref 1.7–2.4)

## 2019-12-08 LAB — PHOSPHORUS: Phosphorus: 4.2 mg/dL (ref 2.5–4.6)

## 2019-12-08 LAB — BRAIN NATRIURETIC PEPTIDE: B Natriuretic Peptide: 2850 pg/mL — ABNORMAL HIGH (ref 0.0–100.0)

## 2019-12-08 MED ORDER — FUROSEMIDE 10 MG/ML IJ SOLN
60.0000 mg | Freq: Once | INTRAMUSCULAR | Status: AC
  Administered 2019-12-08: 60 mg via INTRAVENOUS
  Filled 2019-12-08: qty 6

## 2019-12-08 MED ORDER — METHYLPREDNISOLONE SODIUM SUCC 125 MG IJ SOLR
60.0000 mg | Freq: Four times a day (QID) | INTRAMUSCULAR | Status: DC
  Administered 2019-12-08 – 2019-12-09 (×4): 60 mg via INTRAVENOUS
  Filled 2019-12-08 (×4): qty 2

## 2019-12-08 MED ORDER — METOPROLOL SUCCINATE ER 25 MG PO TB24
25.0000 mg | ORAL_TABLET | Freq: Every day | ORAL | Status: DC
  Administered 2019-12-09: 25 mg via ORAL
  Filled 2019-12-08: qty 1

## 2019-12-08 MED ORDER — ALBUTEROL SULFATE (2.5 MG/3ML) 0.083% IN NEBU: 2.5000 mg | INHALATION_SOLUTION | RESPIRATORY_TRACT | Status: DC | PRN

## 2019-12-08 NOTE — Progress Notes (Addendum)
Progress Note  Patient Name: [name] Date of Encounter: 12/08/2019  [facility] Cardiologist: Carlyle Dolly, MD   Subjective   SOB improving.   Inpatient Medications    Scheduled Meds: . aspirin EC  81 mg Oral Daily  . atorvastatin  40 mg Oral Daily  . budesonide (PULMICORT) nebulizer solution  0.25 mg Nebulization BID  . carvedilol  6.25 mg Oral BID WC  . clopidogrel  75 mg Oral Daily  . guaiFENesin  600 mg Oral BID  . ipratropium-albuterol  3 mL Nebulization QID  . levofloxacin  500 mg Oral Daily  . methylPREDNISolone (SOLU-MEDROL) injection  60 mg Intravenous Q6H  . sodium chloride flush  3 mL Intravenous Q12H  . spironolactone  12.5 mg Oral Daily   Continuous Infusions: . sodium chloride     PRN Meds: sodium chloride, albuterol, sodium chloride flush   Vital Signs    Vitals:   12/08/19 0355 12/08/19 0401 12/08/19 0618 12/08/19 0718  BP: 116/68  (!) 111/49   Pulse: 83  79   Resp: (!) 25  18   Temp: 97.6 F (36.4 C)  97.9 F (36.6 C)   TempSrc: Oral  Oral   SpO2: 100% 94%  98%  Weight:   63 kg   Height:        Intake/Output Summary (Last 24 hours) at 12/08/2019 0925 Last data filed at 12/08/2019 0900 Gross per 24 hour  Intake 920 ml  Output 900 ml  Net 20 ml   Last 3 Weights 12/08/2019 12/07/2019 12/04/2019  Weight (lbs) 139 lb 139 lb 5.3 oz 140 lb  Weight (kg) 63.05 kg 63.2 kg 63.504 kg      Telemetry    SR - Personally Reviewed  ECG    n/a - Personally Reviewed  Physical Exam   GEN: No acute distress.   Neck: mildly elevated JVD Cardiac: RRR, no murmurs, rubs, or gallops.  Respiratory: bilateral wheezing GI: Soft, nontender, non-distended  MS: No edema; No deformity. Neuro:  Nonfocal  Psych: Normal affect   Labs    High Sensitivity Troponin:   Recent Labs  Lab 12/04/19 2135 12/04/19 2355  TROPONINIHS 19* 24*      Chemistry Recent Labs  Lab 12/05/19 0915 12/06/19 0442 12/07/19 0616  NA 138 137 138  K 3.4* 3.4*  4.3  CL 99 96* 97*  CO2 27 28 29   GLUCOSE 135* 152* 141*  BUN 10 21 42*  CREATININE 0.86 1.06* 1.33*  CALCIUM 8.6* 8.5* 8.8*  PROT 6.6  --  6.4*  ALBUMIN 3.5  --  3.5  AST 17  --  16  ALT 12  --  12  ALKPHOS 65  --  55  BILITOT 0.9  --  0.7  GFRNONAA >60 51* 39*  GFRAA >60 59* 45*  ANIONGAP 12 13 12      Hematology Recent Labs  Lab 12/06/19 0442 12/07/19 0616 12/08/19 0825  WBC 8.0 13.0* 9.0  RBC 3.73* 3.99 3.78*  HGB 10.3* 10.8* 10.4*  HCT 33.2* 35.9* 34.4*  MCV 89.0 90.0 91.0  MCH 27.6 27.1 27.5  MCHC 31.0 30.1 30.2  RDW 15.7* 16.1* 16.2*  PLT 175 205 185    BNP Recent Labs  Lab 12/04/19 2135 12/06/19 0442 12/07/19 0616  BNP 2,430.0* 3,718.0* 2,876.0*     DDimer No results for input(s): DDIMER in the last 168 hours.   Radiology    DG CHEST PORT 1 VIEW  Result Date: 12/07/2019 CLINICAL DATA:  Hypoxia EXAM: PORTABLE CHEST 1 VIEW COMPARISON:  12/04/2019 FINDINGS: Hazy increased density on the left attributed to patient's left-sided breast implant. There is interlobular septal thickening on both sides. Cardiopericardial enlargement. Heavily calcified aorta IMPRESSION: 1. Interstitial pulmonary edema. 2. Cardiopericardial enlargement. The mediastinal contours raise the possibility of a pericardial effusion. Electronically Signed   By: Monte Fantasia M.D.   On: 12/07/2019 05:12   ECHOCARDIOGRAM LIMITED  Result Date: 12/07/2019    ECHOCARDIOGRAM LIMITED REPORT   Patient Name:   [name] Date of Exam: 12/07/2019 Medical Rec #:  [id number]        Height:       66.5 in Accession #:    [id number]       Weight:       139.3 lb Date of Birth:  1945/11/19        BSA:          1.725 m Patient Age:    16 years         BP:           119/63 mmHg Patient Gender: F                HR:           87 bpm. Exam Location:  [facility] Procedure: Limited Echo Indications:    Dyspnea 786.09 / R06.00  History:        Patient has prior history of Echocardiogram examinations, most                  recent 10/06/2019. COPD; Risk Factors:Dyslipidemia, Hypertension                 and Current Smoker. Acute respiratory failure, MITRAL                 REGURGITATION.  Sonographer:    Leavy Cella RDCS (AE) Referring Phys: [id number]  1. Left ventricular ejection fraction, by estimation, is 20%. Left ventricular endocardial border not optimally defined to evaluate regional wall motion.  2. The mitral valve is normal in structure. Moderate mitral valve regurgitation. No evidence of mitral stenosis.  3. There is moderately elevated pulmonary artery systolic pressure.  4. The inferior vena cava is dilated in size with >50% respiratory variability, suggesting right atrial pressure of 8 mmHg.  5. Limited echo FINDINGS  Left Ventricle: Left ventricular ejection fraction, by estimation, is 20%. Left ventricular endocardial border not optimally defined to evaluate regional wall motion. Right Ventricle: There is moderately elevated pulmonary artery systolic pressure. The tricuspid regurgitant velocity is 3.05 m/s, and with an assumed right atrial pressure of 10 mmHg, the estimated right ventricular systolic pressure is 03.0 mmHg. Pericardium: Trivial pericardial effusion is present. The pericardial effusion is circumferential. Mitral Valve: The mitral valve is normal in structure. Moderate mitral valve regurgitation. No evidence of mitral valve stenosis. Aortic Valve: Mild aortic valve annular calcification. There is mild thickening of the aortic valve. There is mild calcification of the aortic valve. Venous: The inferior vena cava is dilated in size with greater than 50% respiratory variability, suggesting right atrial pressure of 8 mmHg.  MITRAL VALVE               TRICUSPID VALVE MV Area (PHT): 3.12 cm    TR Peak grad:   37.2 mmHg MV Decel Time: 243 msec    TR Vmax:        305.00 cm/s MR Peak grad: 59.0 mmHg MR  Mean grad: 40.0 mmHg MR Vmax:      384.00 cm/s MR Vmean:     304.0 cm/s MV E  velocity: 81.50 cm/s MV A velocity: 63.50 cm/s MV E/A ratio:  1.28 Carlyle Dolly MD Electronically signed by Carlyle Dolly MD Signature Date/Time: 12/07/2019/4:43:05 PM    Final     Cardiac Studies     Patient Profile     [name] a 73 y.o.femalewith past medical history of chronic systolic CHF (EF 21% by echocardiogram in 09/2019), CAD (s/p BMS to RCA in 2010, cath in 09/2019 showing mild to moderate disease and patent RCA stent with minimal ISR), HTN, HLD, carotid artery stenosis (s/p L CEA in 2010), moderate to severe MR, COPD and history of breast cancerwho is being seen today for the evaluation ofCHFat the request of Dr. Roger Shelter.  Assessment & Plan    1. Acute on chronic systolic HF -chronic sysotlic HF LVEF 30% diagonsed 09/2019 - admitted with volume overloaded due to medication nonadherence, issues affording meds -Dry weight during 10/2019 admission when filling pressures were normal by cath was 136 lbs, admitted at 140 lbs  - I/Os are incomplete this admit. Standing weight today 139 lbs.  - she is on IV lasix 40mg  bid. Significant uptrend in Cr, diuretics held yesterday - labs pending today.   - diuretic pending labs today. She is 3 lbs above her dry weight of 136. May not tolerate IV any further and require oral.   - limited echo yesterday LVEF stable at 20% - CXR yesterday with interstitial pulmonary edema.  - some of her symptoms are related to COPD exacerbation as opposed to solely HF  - medical therapy with coreg 6.25mg  bid, aldactone 12.5mg  daily. Soft bp's at times, not on ACE/ARB/ARNI. ARNI cost will be an issue unless approved for assistance.  - change coreg to toprol givern her COPD  2.History of CEA - has been on plavix regarding this it appears, not for a cardiac reason  3 . COPD  - per primary team - bilateral wheezing.   For questions or updates, please contact [place] Please consult [email or web address] for contact info under          Signed, Carlyle Dolly, MD  12/08/2019, 9:25 AM

## 2019-12-08 NOTE — Plan of Care (Signed)

## 2019-12-08 NOTE — Consult Note (Signed)
NAME:  [name], MRN:  [id number], DOB:  [birth year], LOS: 3 ADMISSION DATE:  12/04/2019, CONSULTATION DATE:  7/2  REFERRING MD:  [facility]ell/ [facility] , CHIEF COMPLAINT:  Sob    Brief History   85 yowf active smoker with only gold II criteria for copd at last spirometry 06/26/09 and last seen by Dr Halford Chessman 02/23/14 unabl eto pay for meds and only using bid saba admit 6/28 with worseing sob with int edema on cxr and bnp 2850 rx for chf and copd and no better so PCCM asked to consult pm 7/2   History of present illness   *74 y.o. female, with history of hypertension, hyperlipidemia, GERD, chronic systolic and diastolic CHF , COPD came to hospital with worsening shortness of breath.    Echocardiogram 4/30/211 showed EF 20% with severely decreased left function, moderate LVH.  Her heart catheterization performed several days later showed mild to moderate nonobstructive CAD.  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 Patient says that she has been having worsening shortness of breath over the past few days.  Also coughing up clear phlegm.  Denies coughing up yellow phlegm.  No fever or chills.  Denies abdominal pain or dysuria. Denies history of stroke or seizures.  Past Medical History  74 y.o. female, with history of hypertension, hyperlipidemia, GERD, chronic systolic and diastolic CHF , COPD came to hospital with worsening shortness of breath.  Patient had echocardiogram and left heart catheterization in April 2021 and first week of May 20 21.  Echocardiogram showed EF 20% with severely decreased left function, moderate LVH.  Her heart catheterization performed several days later showed mild to moderate nonobstructive CAD.  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  C/o worsening shortness of  breath over the past few days.  Also coughing up clear phlegm.    Baseline doe = MMRC4  = sob if tries to leave home or while getting dressed  But really more limited by back than breathing, not on 02.  Extremely hard of hearing and not vaccinated so mask hinders communication.  No purulent sputum, dysphagia, choking, cp or leg swelling at present   Significant Hospital Events      Consults:  Cardiology PCCM   Procedures:    Significant Diagnostic Tests:  Echo 7/1 1. Left ventricular ejection fraction, by estimation, is 20%. Left  ventricular endocardial border not optimally defined to evaluate regional  wall motion.  2. The mitral valve is normal in structure. Moderate mitral valve  regurgitation. No evidence of mitral stenosis.  3. There is moderately elevated pulmonary artery systolic pressure.  4. The inferior vena cava is dilated in size with >50% respiratory  variability, suggesting right atrial pressure of 8 mmHg.     Micro Data:  COVID 19 PCR 6/28 neg   Antimicrobials:  Levaquin  6/29 >>>  Scheduled Meds: . aspirin EC  81 mg Oral Daily  . atorvastatin  40 mg Oral Daily  . budesonide (PULMICORT) nebulizer solution  0.25 mg Nebulization BID  . carvedilol  6.25 mg Oral BID WC  . clopidogrel  75 mg Oral Daily  . guaiFENesin  600 mg Oral BID  . ipratropium-albuterol  3 mL Nebulization QID  . levofloxacin  500 mg Oral Daily  . methylPREDNISolone (SOLU-MEDROL) injection  60 mg Intravenous  Q6H  . [START ON 12/09/2019] metoprolol succinate  25 mg Oral Daily  . sodium chloride flush  3 mL Intravenous Q12H  . spironolactone  12.5 mg Oral Daily   Continuous Infusions: . sodium chloride     PRN Meds:.sodium chloride, albuterol, sodium chloride flush    Interim history/subjective:  Can't stay out of bed due to back pain   Objective   Blood pressure (!) 111/49, pulse 79, temperature 97.9 F (36.6 C), temperature source Oral, resp. rate 18, height 5' 6.5" (1.689 m),  weight 63 kg, SpO2 95 %.        Intake/Output Summary (Last 24 hours) at 12/08/2019 1444 Last data filed at 12/08/2019 1300 Gross per 24 hour  Intake 923 ml  Output 1300 ml  Net -377 ml   Filed Weights   12/04/19 2123 12/07/19 1119 12/08/19 0618  Weight: 63.5 kg 63.2 kg 63 kg    Examination: Tmax 98 NECK :  without JVD/Nodes/TM/ nl carotid upstrokes bilaterally LUNGS: no acc muscle use,  Mild barrel  contour chest wall with bilateral  Distant bs s audible wheeze and  without cough on insp or exp maneuvers  and mild  Hyperresonant  to  percussion bilaterally   CV:  RRR  no s3 or murmur or increase in P2, and no edema  ABD:  soft and nontender with pos end  insp Hoover's  in the supine position. No bruits or organomegaly appreciated, bowel sounds nl MS:    without deformities, calf tenderness, cyanosis or clubbing No obvious joint restrictions  SKIN: warm and dry without lesions   NEURO:  alert, no deficits/ extremely HOH        I personally reviewed images and agree with radiology impression as follows:  CXR: portable 7/1  1. Interstitial pulmonary edema. 2. Cardiopericardial enlargement. The mediastinal contours raise the possibility of a pericardial effusion.  Resolved Hospital Problem list      Assessment & Plan:   1) COPD likely still only Mild/moderate though smoked until admit  DDX of  difficult airways management almost all start with A and  include Adherence, Ace Inhibitors, Acid Reflux, Active Sinus Disease, Alpha 1 Antitripsin deficiency, Anxiety masquerading as Airways dz,  ABPA,  Allergy(esp in young), Aspiration (esp in elderly), Adverse effects of meds,  Active smoking or vaping, A bunch of PE's (a small clot burden can't cause this syndrome unless there is already severe underlying pulm or vascular dz with poor reserve) plus two Bs  = Bronchiectasis and Beta blocker use..and one C= CHF   Adherence is always the initial "prime suspect" and is a multilayered  concern that requires a "trust but verify" approach in every patient - starting with knowing how to use medications, especially inhalers, correctly, keeping up with refills and understanding the fundamental difference between maintenance and prns vs those medications only taken for a very short course and then stopped and not refilled.  - my advice is keep it simple:  trelegy one click each am and prn saba and f/u with [facility] with all meds in hand using a trust but verify approach to confirm accurate Medication  Reconciliation The principal here is that until we are certain that the  patients are doing what we've asked, it makes no sense to ask them to do more.   Active smoking > advised: I emphasized that although we never turn away smokers from the pulmonary clinic, we do ask that they understand that the recommendations that we make  won't  work nearly as well in the presence of continued cigarette exposure. In fact, we may very well  reach a point where we can't promise to help the patient if he/she can't quit smoking. (We can and will promise to try to help, we just can't promise what we recommend will really work)   ? Ace case > ACE inhibitors are problematic in  pts with airway complaints because  even experienced pulmonologists can't always distinguish ace effects from copd/asthma.  By themselves they don't actually cause a problem, much like oxygen can't by itself start a fire, but they certainly serve as a powerful catalyst or enhancer for any "fire"  or inflammatory process in the upper airway, be it caused by an ET  tube or more commonly reflux (especially in the obese or pts with known GERD or who are on biphoshonates).    In the era of ARB near equivalency until we have a better handle on the reversibility of the airway problem, it just makes sense to avoid ACEI  entirely in the short run and then decide later, having established a level of airway control using a reasonable limited regimen, whether  to add back ace but even then being very careful to observe the pt for worsening airway control and number of meds used/ needed to control symptoms.    ? BB effects> unlikely with low dose coreg but keep in mind  In the setting of respiratory symptoms of unknown etiology,  It would be preferable to use  bisoprolol the most selective generic choice  on the market, at least on a trial basis, to make sure the spillover Beta 2 effects of the less specific Beta blockers are not contributing to this patient's symptoms.   ? chf > clearly suggested by cxr/ echo and bnp = "cardiac asthma" and for which LAMA is the drug of choice form a pulmonary perspective historically , if she can tolerate, plus continue rx    Group D in terms of symptom/risk and laba/lama/ICS  therefore appropriate rx at this point >>>  trelegy best choice for her as easiest to teach.    2) ? 02 dep > sats ok on RA but need to be repeated with ambulation prior to d/c    F/u as outpt per Dr Halford Chessman at either office - whatever works best.       Labs   CBC: Recent Labs  Lab 12/04/19 2135 12/05/19 0915 12/06/19 0442 12/07/19 0616 12/08/19 0825  WBC 7.2 5.4 8.0 13.0* 9.0  NEUTROABS  --   --   --  12.0* 8.0*  HGB 11.1* 11.2* 10.3* 10.8* 10.4*  HCT 36.6 37.0 33.2* 35.9* 34.4*  MCV 89.3 89.8 89.0 90.0 91.0  PLT 211 186 175 205 767    Basic Metabolic Panel: Recent Labs  Lab 12/04/19 2135 12/05/19 0915 12/06/19 0442 12/07/19 0616 12/08/19 0825  NA 136 138 137 138 138  K 3.5 3.4* 3.4* 4.3 3.9  CL 98 99 96* 97* 97*  CO2 26 27 28 29 29   GLUCOSE 114* 135* 152* 141* 142*  BUN 9 10 21  42* 38*  CREATININE 0.88 0.86 1.06* 1.33* 1.00  CALCIUM 8.7* 8.6* 8.5* 8.8* 8.9  MG  --   --   --  2.2 2.5*  PHOS  --   --   --  3.8 4.2   GFR: Estimated Creatinine Clearance: 45.7 mL/min (by C-G formula based on SCr of 1 mg/dL). Recent Labs  Lab 12/05/19 0915 12/06/19 3419  12/07/19 0616 12/08/19 0825  WBC 5.4 8.0 13.0* 9.0     Liver Function Tests: Recent Labs  Lab 12/05/19 0915 12/07/19 0616 12/08/19 0825  AST 17 16 16   ALT 12 12 14   ALKPHOS 65 55 49  BILITOT 0.9 0.7 0.4  PROT 6.6 6.4* 6.3*  ALBUMIN 3.5 3.5 3.4*   No results for input(s): LIPASE, AMYLASE in the last 168 hours. No results for input(s): AMMONIA in the last 168 hours.  ABG    Component Value Date/Time   PHART 7.469 (H) 10/09/2019 1441   PCO2ART 41.1 10/09/2019 1441   PO2ART 145 (H) 10/09/2019 1441   HCO3 29.9 (H) 10/09/2019 1441   TCO2 31 10/09/2019 1441   O2SAT 99.0 10/09/2019 1441     Coagulation Profile: No results for input(s): INR, PROTIME in the last 168 hours.  Cardiac Enzymes: No results for input(s): CKTOTAL, CKMB, CKMBINDEX, TROPONINI in the last 168 hours.  HbA1C: Hgb A1c MFr Bld  Date/Time Value Ref Range Status  10/06/2019 01:50 AM 5.5 4.8 - 5.6 % Final    Comment:    (NOTE) Pre diabetes:          5.7%-6.4% Diabetes:              >6.4% Glycemic control for   <7.0% adults with diabetes   10/31/2009 11:42 PM (H) <5.7 % Final   5.7 (NOTE)                                                                       According to the ADA Clinical Practice Recommendations for 2011, when HbA1c is used as a screening test:   >=6.5%   Diagnostic of Diabetes Mellitus           (if abnormal result  is confirmed)  5.7-6.4%   Increased risk of developing Diabetes Mellitus  References:Diagnosis and Classification of Diabetes Mellitus,Diabetes KWIO,9735,32(DJMEQ 1):S62-S69 and Standards of Medical Care in         Diabetes - 2011,Diabetes Care,2011,34  (Suppl 1):S11-S61.    CBG: No results for input(s): GLUCAP in the last 168 hours.     Past Medical History  She,  has a past medical history of Breast cancer ([place]), Carotid artery stenosis, COPD (chronic obstructive pulmonary disease) ([place]), Coronary artery disease, Depression, Enlargement of lymph node, GERD (gastroesophageal reflux disease), Hyperlipidemia, Hypertension,  LBP (low back pain), Mitral regurgitation, Osteoarthritis, Sciatica, Systolic congestive heart failure ([place]), Tobacco user, and Urge urinary incontinence.   Surgical History    Past Surgical History:  Procedure Laterality Date  . APPENDECTOMY     as a child  . BACK SURGERY  1995  . bare-metal stenting of critical mid ICA stenosis     w/ ulcerated plaque/thrombus  . CAROTID ENDARTERECTOMY    . [place]  . CHOLECYSTECTOMY  1980s  . MASTECTOMY  1992   L  . RIGHT/LEFT HEART CATH AND CORONARY ANGIOGRAPHY N/A 10/09/2019   Procedure: RIGHT/LEFT HEART CATH AND CORONARY ANGIOGRAPHY;  Surgeon: Nelva Bush, MD;  Location: [facility];  Service: Cardiovascular;  Laterality: N/A;     Social History   reports that she has been smoking cigarettes. She has a 51.00 pack-year smoking history. She has  never used smokeless tobacco. She reports that she does not drink alcohol and does not use drugs.   Family History   Her family history includes Diabetes in her mother; Heart attack in her mother; Heart disease in her brother; Hypertension in her mother; Lung cancer in her father; Stroke in her brother and father.   Allergies Allergies  Allergen Reactions  . Gabapentin Swelling     Home Medications  Prior to Admission medications   Medication Sig Start Date End Date Taking? Authorizing Provider  albuterol (VENTOLIN HFA) 108 (90 Base) MCG/ACT inhaler Inhale 2 puffs into the lungs every 6 (six) hours as needed for wheezing or shortness of breath. 05/01/19 04/30/20 Yes [provider]  aspirin 81 MG tablet Take 81 mg by mouth daily.   Yes [provider]  carvedilol (COREG) 6.25 MG tablet Take 6.25 mg by mouth 2 (two) times daily with a meal.     Yes [provider]  clopidogrel (PLAVIX) 75 MG tablet Take 75 mg by mouth daily.     Yes [provider]  furosemide (LASIX) 40 MG tablet Take 40 mg by mouth daily.     Yes [provider]  lisinopril (ZESTRIL) 40 MG tablet Take 40 mg by mouth daily. 11/09/19  Yes [provider]  simvastatin (ZOCOR) 80 MG tablet Take 80 mg by mouth at bedtime.     Yes [provider]  spironolactone (ALDACTONE) 25 MG tablet Take 0.5 tablets (12.5 mg total) by mouth daily. 10/11/19 12/05/19 Yes Ghimire, Dante Gang, MD  traMADol (ULTRAM) 50 MG tablet Take 50 mg by mouth every 6 (six) hours as needed. 09/07/19  Yes [provider]  traZODone (DESYREL) 100 MG tablet Take 100 mg by mouth at bedtime as needed. Patient not taking: Reported on 12/05/2019 05/01/19   [provider]  buPROPion (WELLBUTRIN XL) 150 MG 24 hr tablet Take 1 tablet (150 mg total) by mouth daily. 01/26/11 08/28/11  Josue Hector, MD  fluticasone (FLONASE) 50 MCG/ACT nasal spray Place 2 sprays into the nose daily.    08/28/11  [provider]       Christinia Gully, MD [facility] [place] [phone]   After 7:00 pm call Elink  [phone]

## 2019-12-08 NOTE — Progress Notes (Addendum)
PROGRESS NOTE    [name]  WYO:[id number] DOB: 01-16-1946 DOA: 12/04/2019 PCP: Patient, No Pcp Per   Chief Complaint  Patient presents with  . Shortness of Breath    Brief Narrative:  GlendaEdwardsis a74 y.o.female,with history of hypertension, hyperlipidemia, GERD, chronic systolic and diastolic CHF , COPD came to hospital with worsening shortness of breath.  Reported worsening shortness of breath at rest and with exertion over past few days.  Also having cough with clear phlegm.  Not O2 dependent at baseline.  Patient had echocardiogram and left heart catheterization in April 2021 and first week of May 2021, respectively. Echocardiogram showed EF 20% with severely decreased left function, moderate LVH. Her heart catheterization performed several days later showed mild to moderate nonobstructive CAD. 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  She's currently being treated for Shota Kohrs COPD exacerbation and HF exacerbation, cardiology is following.  Assessment & Plan:   Active Problems:   Acute respiratory failure with hypoxemia (HCC)  Acute respiratory failure with hypoxemia -Shortness of breath hypoxemia seems to be multifactorial, including underlying CHF, COPD  -treat HF and COPD as noted below -O2 needs fluctuating from RA to 2-3 L by Cloud - CXR 12/07/19 -> interstitial pulm edema, cardiopericardial enlargement -> follow limited echo for effusion - no effusion noted on limited echo  Acute on chronic systolic and diastolic CHF - Echo 7/74/12 with EF 20%, RVSF mildly reduced, mildly elevated PASP, functional MR, moderate to severe MVR (see report) - cath from 10/2019 with mild to mod non obstructive CAD -> reduction in EF out of proportion to CAD -> c/w non ischemic cardiomyopathy (see report) - BNP down today - creatinine improved - diuresis per cardiology -  cardiology c/s, appreciate recommendations - coreg changed to toprol with COPD - medications were an issue prior to admission - was discharged with coreg, lasix, entresto, and spironolactone - she was unable to afford many of her meds (may have only been taking spironolactone?).  Per cards, instead of entresto, will likely start losartan if BP allows - continue coreg -> metop, spironolactone - elevated troponin is mild and likely related to demand ischemia  - I/O, daily weights  COPD exacerbation - continued wheezing today, seems worse today  - continue solumedrol, budesonide - scheduled and prn nebs, levaquin x 5 days - appears she's only on albuterol at home - wean as tolerated - pulmonology consult today  Hypertension /hypotensive on admission - SBP 104 this am   - continue home BP meds as able   HLD: continue simvastatin  Carotid Artery Stenosis: s/p L CEA in 2010, on aspirin, plavix, statin  DVT prophylaxis: lovenox Code Status: full  Family Communication: none at bedside - discussed with daughter over phone 7/1 Disposition:   Status is: Inpatient  Remains inpatient appropriate because:Inpatient level of care appropriate due to severity of illness   Dispo: The patient is from: Home              Anticipated d/c is to: pending              Anticipated d/c date is: > 3 days              Patient currently is not medically stable to d/c.  Consultants:   cardiology  Procedures:   none  Antimicrobials:  Anti-infectives (From admission, onward)   Start     Dose/Rate Route  Frequency Ordered Stop   12/05/19 1000  levofloxacin (LEVAQUIN) tablet 500 mg     Discontinue    Note to Pharmacy: COPD   500 mg Oral Daily 12/05/19 0723 12/10/19 0959      Subjective: C/o continued SOB Not much better today  Objective: Vitals:   12/08/19 0355 12/08/19 0401 12/08/19 0618 12/08/19 0718  BP: 116/68  (!) 111/49   Pulse: 83  79   Resp: (!) 25  18   Temp: 97.6 F (36.4 C)   97.9 F (36.6 C)   TempSrc: Oral  Oral   SpO2: 100% 94%  98%  Weight:   63 kg   Height:        Intake/Output Summary (Last 24 hours) at 12/08/2019 1051 Last data filed at 12/08/2019 0929 Gross per 24 hour  Intake 923 ml  Output 1300 ml  Net -377 ml   Filed Weights   12/04/19 2123 12/07/19 1119 12/08/19 0618  Weight: 63.5 kg 63.2 kg 63 kg    Examination:  General: No acute distress. Cardiovascular: Heart sounds show [name] regular rate, and rhythm.  Lungs: audible wheezing from end of bed, diffuse wheezing on exam Abdomen: Soft, nontender, nondistended Neurological: Alert and oriented 3. Moves all extremities 4. Cranial nerves II through XII grossly intact. Skin: Warm and dry. No rashes or lesions. Extremities: No clubbing or cyanosis. Trace edema     Data Reviewed: I have personally reviewed following labs and imaging studies  CBC: Recent Labs  Lab 12/04/19 2135 12/05/19 0915 12/06/19 0442 12/07/19 0616 12/08/19 0825  WBC 7.2 5.4 8.0 13.0* 9.0  NEUTROABS  --   --   --  12.0* 8.0*  HGB 11.1* 11.2* 10.3* 10.8* 10.4*  HCT 36.6 37.0 33.2* 35.9* 34.4*  MCV 89.3 89.8 89.0 90.0 91.0  PLT 211 186 175 205 448    Basic Metabolic Panel: Recent Labs  Lab 12/04/19 2135 12/05/19 0915 12/06/19 0442 12/07/19 0616 12/08/19 0825  NA 136 138 137 138 138  K 3.5 3.4* 3.4* 4.3 3.9  CL 98 99 96* 97* 97*  CO2 26 27 28 29 29   GLUCOSE 114* 135* 152* 141* 142*  BUN 9 10 21  42* 38*  CREATININE 0.88 0.86 1.06* 1.33* 1.00  CALCIUM 8.7* 8.6* 8.5* 8.8* 8.9  MG  --   --   --  2.2 2.5*  PHOS  --   --   --  3.8 4.2    GFR: Estimated Creatinine Clearance: 45.7 mL/min (by C-G formula based on SCr of 1 mg/dL).  Liver Function Tests: Recent Labs  Lab 12/05/19 0915 12/07/19 0616 12/08/19 0825  AST 17 16 16   ALT 12 12 14   ALKPHOS 65 55 49  BILITOT 0.9 0.7 0.4  PROT 6.6 6.4* 6.3*  ALBUMIN 3.5 3.5 3.4*    CBG: No results for input(s): GLUCAP in the last 168 hours.   Recent  Results (from the past 240 hour(s))  SARS Coronavirus 2 by RT PCR (hospital order, performed in [facility] hospital lab) Nasopharyngeal Nasopharyngeal Swab     Status: None   Collection Time: 12/04/19 10:40 PM   Specimen: Nasopharyngeal Swab  Result Value Ref Range Status   SARS Coronavirus 2 NEGATIVE NEGATIVE Final    Comment: (NOTE) SARS-CoV-2 target nucleic acids are NOT DETECTED.  The SARS-CoV-2 RNA is generally detectable in upper and lower respiratory specimens during the acute phase of infection. The lowest concentration of SARS-CoV-2 viral copies this assay can detect is 250 copies /  mL. Todd Argabright negative result does not preclude SARS-CoV-2 infection and should not be used as the sole basis for treatment or other patient management decisions.  Ayman Brull negative result may occur with improper specimen collection / handling, submission of specimen other than nasopharyngeal swab, presence of viral mutation(s) within the areas targeted by this assay, and inadequate number of viral copies (<250 copies / mL). Lamira Borin negative result must be combined with clinical observations, patient history, and epidemiological information.  Fact Sheet for Patients:   StrictlyIdeas.no  Fact Sheet for Healthcare Providers: BankingDealers.co.za  This test is not yet approved or  cleared by the [place] FDA and has been authorized for detection and/or diagnosis of SARS-CoV-2 by FDA under an Emergency Use Authorization (EUA).  This EUA will remain in effect (meaning this test can be used) for the duration of the COVID-19 declaration under Section 564(b)(1) of the Act, 21 U.S.C. section 360bbb-3(b)(1), unless the authorization is terminated or revoked sooner.  Performed at [facility], [address], [place], [place] [address]          Radiology Studies: DG CHEST PORT 1 VIEW  Result Date: 12/07/2019 CLINICAL DATA:  Hypoxia EXAM: PORTABLE CHEST 1 VIEW  COMPARISON:  12/04/2019 FINDINGS: Hazy increased density on the left attributed to patient's left-sided breast implant. There is interlobular septal thickening on both sides. Cardiopericardial enlargement. Heavily calcified aorta IMPRESSION: 1. Interstitial pulmonary edema. 2. Cardiopericardial enlargement. The mediastinal contours raise the possibility of Daisy Lites pericardial effusion. Electronically Signed   By: Monte Fantasia M.D.   On: 12/07/2019 05:12   ECHOCARDIOGRAM LIMITED  Result Date: 12/07/2019    ECHOCARDIOGRAM LIMITED REPORT   Patient Name:   DELAYLA HOFFMASTER Date of Exam: 12/07/2019 Medical Rec #:  [id number]        Height:       66.5 in Accession #:    [id number]       Weight:       139.3 lb Date of Birth:  1945-10-01        BSA:          1.725 m Patient Age:    66 years         BP:           119/63 mmHg Patient Gender: F                HR:           87 bpm. Exam Location:  [facility] Procedure: Limited Echo Indications:    Dyspnea 786.09 / R06.00  History:        Patient has prior history of Echocardiogram examinations, most                 recent 10/06/2019. COPD; Risk Factors:Dyslipidemia, Hypertension                 and Current Smoker. Acute respiratory failure, MITRAL                 REGURGITATION.  Sonographer:    Leavy Cella RDCS (AE) Referring Phys: [id number] Hiko  1. Left ventricular ejection fraction, by estimation, is 20%. Left ventricular endocardial border not optimally defined to evaluate regional wall motion.  2. The mitral valve is normal in structure. Moderate mitral valve regurgitation. No evidence of mitral stenosis.  3. There is moderately elevated pulmonary artery systolic pressure.  4. The inferior vena cava is dilated in size with >50% respiratory variability, suggesting right atrial pressure of  8 mmHg.  5. Limited echo FINDINGS  Left Ventricle: Left ventricular ejection fraction, by estimation, is 20%. Left ventricular endocardial border not optimally  defined to evaluate regional wall motion. Right Ventricle: There is moderately elevated pulmonary artery systolic pressure. The tricuspid regurgitant velocity is 3.05 m/s, and with an assumed right atrial pressure of 10 mmHg, the estimated right ventricular systolic pressure is 62.8 mmHg. Pericardium: Trivial pericardial effusion is present. The pericardial effusion is circumferential. Mitral Valve: The mitral valve is normal in structure. Moderate mitral valve regurgitation. No evidence of mitral valve stenosis. Aortic Valve: Mild aortic valve annular calcification. There is mild thickening of the aortic valve. There is mild calcification of the aortic valve. Venous: The inferior vena cava is dilated in size with greater than 50% respiratory variability, suggesting right atrial pressure of 8 mmHg.  MITRAL VALVE               TRICUSPID VALVE MV Area (PHT): 3.12 cm    TR Peak grad:   37.2 mmHg MV Decel Time: 243 msec    TR Vmax:        305.00 cm/s MR Peak grad: 59.0 mmHg MR Mean grad: 40.0 mmHg MR Vmax:      384.00 cm/s MR Vmean:     304.0 cm/s MV E velocity: 81.50 cm/s MV Arlena Marsan velocity: 63.50 cm/s MV E/Savion Washam ratio:  1.28 Carlyle Dolly MD Electronically signed by Carlyle Dolly MD Signature Date/Time: 12/07/2019/4:43:05 PM    Final         Scheduled Meds: . aspirin EC  81 mg Oral Daily  . atorvastatin  40 mg Oral Daily  . budesonide (PULMICORT) nebulizer solution  0.25 mg Nebulization BID  . carvedilol  6.25 mg Oral BID WC  . clopidogrel  75 mg Oral Daily  . guaiFENesin  600 mg Oral BID  . ipratropium-albuterol  3 mL Nebulization QID  . levofloxacin  500 mg Oral Daily  . methylPREDNISolone (SOLU-MEDROL) injection  60 mg Intravenous Q6H  . [START ON 12/09/2019] metoprolol succinate  25 mg Oral Daily  . sodium chloride flush  3 mL Intravenous Q12H  . spironolactone  12.5 mg Oral Daily   Continuous Infusions: . sodium chloride       LOS: 3 days    Time spent: over 30 min    Fayrene Helper,  MD [facility]   To contact the attending provider between 7A-7P or the covering provider during after hours 7P-7A, please log into the web site [email or web address] and access using universal [facility] password for that web site. If you do not have the password, please call the hospital operator.  12/08/2019, 10:51 AM

## 2019-12-08 NOTE — Progress Notes (Signed)
Cr trending back down after holding IV diuretic yesterday. Would give IV lasix 60mg  x 1 today, likely tomorrow start oral 40mg  bid.     Carlyle Dolly MD

## 2019-12-09 DIAGNOSIS — I5043 Acute on chronic combined systolic (congestive) and diastolic (congestive) heart failure: Secondary | ICD-10-CM

## 2019-12-09 DIAGNOSIS — J449 Chronic obstructive pulmonary disease, unspecified: Secondary | ICD-10-CM

## 2019-12-09 LAB — CBC WITH DIFFERENTIAL/PLATELET
Abs Immature Granulocytes: 0.09 10*3/uL — ABNORMAL HIGH (ref 0.00–0.07)
Basophils Absolute: 0 10*3/uL (ref 0.0–0.1)
Basophils Relative: 0 %
Eosinophils Absolute: 0 10*3/uL (ref 0.0–0.5)
Eosinophils Relative: 0 %
HCT: 31.9 % — ABNORMAL LOW (ref 36.0–46.0)
Hemoglobin: 9.7 g/dL — ABNORMAL LOW (ref 12.0–15.0)
Immature Granulocytes: 1 %
Lymphocytes Relative: 9 %
Lymphs Abs: 0.8 10*3/uL (ref 0.7–4.0)
MCH: 27.4 pg (ref 26.0–34.0)
MCHC: 30.4 g/dL (ref 30.0–36.0)
MCV: 90.1 fL (ref 80.0–100.0)
Monocytes Absolute: 0.2 10*3/uL (ref 0.1–1.0)
Monocytes Relative: 2 %
Neutro Abs: 8 10*3/uL — ABNORMAL HIGH (ref 1.7–7.7)
Neutrophils Relative %: 88 %
Platelets: 187 10*3/uL (ref 150–400)
RBC: 3.54 MIL/uL — ABNORMAL LOW (ref 3.87–5.11)
RDW: 15.9 % — ABNORMAL HIGH (ref 11.5–15.5)
WBC: 9.1 10*3/uL (ref 4.0–10.5)
nRBC: 0 % (ref 0.0–0.2)

## 2019-12-09 LAB — COMPREHENSIVE METABOLIC PANEL
ALT: 16 U/L (ref 0–44)
AST: 17 U/L (ref 15–41)
Albumin: 3.4 g/dL — ABNORMAL LOW (ref 3.5–5.0)
Alkaline Phosphatase: 45 U/L (ref 38–126)
Anion gap: 11 (ref 5–15)
BUN: 52 mg/dL — ABNORMAL HIGH (ref 8–23)
CO2: 31 mmol/L (ref 22–32)
Calcium: 8.8 mg/dL — ABNORMAL LOW (ref 8.9–10.3)
Chloride: 96 mmol/L — ABNORMAL LOW (ref 98–111)
Creatinine, Ser: 0.92 mg/dL (ref 0.44–1.00)
GFR calc Af Amer: >60 mL/min (ref >60)
GFR calc non Af Amer: >60 mL/min (ref >60)
Glucose, Bld: 149 mg/dL — ABNORMAL HIGH (ref 70–99)
Potassium: 3.5 mmol/L (ref 3.5–5.1)
Sodium: 138 mmol/L (ref 135–145)
Total Bilirubin: 0.6 mg/dL (ref 0.3–1.2)
Total Protein: 6.1 g/dL — ABNORMAL LOW (ref 6.5–8.1)

## 2019-12-09 LAB — MAGNESIUM: Magnesium: 2.4 mg/dL (ref 1.7–2.4)

## 2019-12-09 LAB — PHOSPHORUS: Phosphorus: 4.2 mg/dL (ref 2.5–4.6)

## 2019-12-09 MED ORDER — SPIRONOLACTONE 12.5 MG HALF TABLET: 12.5000 mg | ORAL_TABLET | Freq: Every day | ORAL | Status: DC

## 2019-12-09 MED ORDER — PREDNISONE 20 MG PO TABS: 40.0000 mg | ORAL_TABLET | Freq: Every day | ORAL | 0 refills | Status: AC

## 2019-12-09 MED ORDER — FUROSEMIDE 40 MG PO TABS
40.0000 mg | ORAL_TABLET | Freq: Two times a day (BID) | ORAL | Status: DC
  Administered 2019-12-09: 40 mg via ORAL
  Filled 2019-12-09: qty 1

## 2019-12-09 MED ORDER — FUROSEMIDE 40 MG PO TABS: 40.0000 mg | ORAL_TABLET | Freq: Two times a day (BID) | ORAL | 2 refills | Status: DC

## 2019-12-09 MED ORDER — GUAIFENESIN ER 600 MG PO TB12: 600.0000 mg | ORAL_TABLET | Freq: Two times a day (BID) | ORAL | 0 refills | Status: AC

## 2019-12-09 MED ORDER — POTASSIUM CHLORIDE ER 10 MEQ PO TBCR: 10.0000 meq | EXTENDED_RELEASE_TABLET | Freq: Two times a day (BID) | ORAL | 2 refills | Status: DC

## 2019-12-09 MED ORDER — METOPROLOL SUCCINATE ER 25 MG PO TB24: 25.0000 mg | ORAL_TABLET | Freq: Every day | ORAL | 2 refills | Status: DC

## 2019-12-09 MED ORDER — METHYLPREDNISOLONE SODIUM SUCC 125 MG IJ SOLR: 60.0000 mg | Freq: Two times a day (BID) | INTRAMUSCULAR | Status: DC

## 2019-12-09 NOTE — Discharge Summary (Signed)
Physician Discharge Summary  ALAINAH PHANG GLO:[id number] DOB: [birth year] DOA: 12/04/2019  PCP: Patient, No Pcp Per  Admit date: 12/04/2019  Discharge date: 12/09/2019  Admitted From:Home  Disposition:  Home  Recommendations for Outpatient Follow-up:  1. Follow up with PCP in 1-2 weeks and obtain BMP in 1 week 2. Follow-up with [facility] pulmonology group as requested in 1-2 weeks 3. Follow-up with cardiology [place] office as requested.  Patient will be scheduled. 4. Continue on prednisone as prescribed for 5 more days 5. Continue now on Toprol instead of Coreg as prescribed by cardiology 6. Hold further use of ACE inhibitor 7. Continue Lasix 40 mg twice daily 8. Continue on potassium as prescribed  Home Health: None  Equipment/Devices: None  Discharge Condition: Stable  CODE STATUS: Full  Diet recommendation: Heart Healthy  Brief/Interim Summary: GlendaEdwardsis a74 y.o.female,with history of hypertension, hyperlipidemia, GERD, chronic systolic and diastolic CHF , COPD came to hospital with worsening shortness of breath.  Reported worsening shortness of breath at rest and with exertion over past few days. Also having cough with clear phlegm.  Not O2 dependent at baseline.  Patient had echocardiogram and left heart catheterization in April 2021 and first week of May 2021, respectively. Echocardiogram showed EF 20% with severely decreased left function, moderate LVH. Her heart catheterization performed several days later showed mild to moderate nonobstructive CAD. 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  Acute respiratory failure with hypoxemia -Multifactorial and resolved -Currently on room air with no dyspnea  Acute on chronic systolic and diastolic CHF - Echo 1/88/41 with EF 20%, RVSF mildly reduced, mildly elevated PASP, functional MR, moderate to  severe MVR (see report) - cath from 10/2019 with mild to mod non obstructive CAD -> reduction in EF out of proportion to CAD -> c/w non ischemic cardiomyopathy (see report) -Coreg changed to Toprol due to COPD and patient will continue on Aldactone.  She is unable to afford many of her medications to include Entresto.  May consider losartan in the outpatient setting if blood pressures remain stable now on Lasix 40 mg twice daily. -We will plan to schedule cardiology visit outpatient  COPD exacerbation-resolved -Continue on prednisone for taper as prescribed and use albuterol at home as needed -Follow-up with pulmonology as requested in 1-2 weeks  Hypertension -Continue medications as prescribed below   HLD: continue simvastatin  Carotid Artery Stenosis: s/p L CEA in 2010, on aspirin, plavix, statin-continue  Discharge Diagnoses:  Active Problems:   Acute on chronic congestive heart failure (HCC)   COPD exacerbation (HCC)   COPD (chronic obstructive pulmonary disease) (HCC)   Acute respiratory failure with hypoxemia (HCC)   Hypoxia   Acute pulmonary edema (HCC)  Principal discharge diagnosis: Acute hypoxemic respiratory failure secondary to acute on chronic systolic and diastolic CHF exacerbation as well as COPD exacerbation.  Discharge Instructions  Discharge Instructions    Ambulatory referral to Pulmonology   Complete by: As directed    Refer to Dr. Halford Chessman in 1-2 weeks.   Reason for referral: Asthma/COPD   Diet - low sodium heart healthy   Complete by: As directed    Increase activity slowly   Complete by: As directed    No wound care   Complete by: As directed      Allergies as of 12/09/2019      Reactions   Gabapentin Swelling      Medication List  STOP taking these medications   carvedilol 6.25 MG tablet Commonly known as: COREG   lisinopril 40 MG tablet Commonly known as: ZESTRIL     TAKE these medications   albuterol 108 (90 Base) MCG/ACT  inhaler Commonly known as: VENTOLIN HFA Inhale 2 puffs into the lungs every 6 (six) hours as needed for wheezing or shortness of breath.   aspirin 81 MG tablet Take 81 mg by mouth daily.   clopidogrel 75 MG tablet Commonly known as: PLAVIX Take 75 mg by mouth daily.   furosemide 40 MG tablet Commonly known as: LASIX Take 1 tablet (40 mg total) by mouth 2 (two) times daily. What changed: when to take this   guaiFENesin 600 MG 12 hr tablet Commonly known as: MUCINEX Take 1 tablet (600 mg total) by mouth 2 (two) times daily for 10 days.   metoprolol succinate 25 MG 24 hr tablet Commonly known as: TOPROL-XL Take 1 tablet (25 mg total) by mouth daily. Start taking on: December 10, 2019   potassium chloride 10 MEQ tablet Commonly known as: KLOR-CON Take 1 tablet (10 mEq total) by mouth 2 (two) times daily.   predniSONE 20 MG tablet Commonly known as: Deltasone Take 2 tablets (40 mg total) by mouth daily for 5 days.   simvastatin 80 MG tablet Commonly known as: ZOCOR Take 80 mg by mouth at bedtime.   spironolactone 25 MG tablet Commonly known as: ALDACTONE Take 0.5 tablets (12.5 mg total) by mouth daily.   traMADol 50 MG tablet Commonly known as: ULTRAM Take 50 mg by mouth every 6 (six) hours as needed.   traZODone 100 MG tablet Commonly known as: DESYREL Take 100 mg by mouth at bedtime as needed.       Follow-up Information    pcp Follow up in 1 week(s).        [facility] Follow up in 1 week(s).   Specialty: Pulmonology Contact information: [place] [address] [phone]             Allergies  Allergen Reactions  . Gabapentin Swelling    Consultations:  Cardiology  Pulmonology   Procedures/Studies: DG CHEST PORT 1 VIEW  Result Date: 12/07/2019 CLINICAL DATA:  Hypoxia EXAM: PORTABLE CHEST 1 VIEW COMPARISON:  12/04/2019 FINDINGS: Hazy increased density on the left attributed to patient's left-sided  breast implant. There is interlobular septal thickening on both sides. Cardiopericardial enlargement. Heavily calcified aorta IMPRESSION: 1. Interstitial pulmonary edema. 2. Cardiopericardial enlargement. The mediastinal contours raise the possibility of a pericardial effusion. Electronically Signed   By: Monte Fantasia M.D.   On: 12/07/2019 05:12   DG Chest Portable 1 View  Result Date: 12/04/2019 CLINICAL DATA:  Cough. Shortness of breath. Bilateral lower extremity swelling. EXAM: PORTABLE CHEST 1 VIEW COMPARISON:  Radiograph 10/06/2019. FINDINGS: Cardiomegaly, equivocal increased from prior exam. Unchanged mediastinal contours. Aortic atherosclerosis. Progressive interstitial edema from prior exam. Suspected small pleural effusions. Linear atelectasis in the left mid lung zone. No confluent airspace disease. No pneumothorax. Mild hyperinflation. No acute osseous abnormalities are seen. IMPRESSION: Cardiomegaly with interstitial edema and probable small pleural effusions. Findings consistent with CHF. Aortic Atherosclerosis (ICD10-I70.0). Electronically Signed   By: Keith Rake M.D.   On: 12/04/2019 22:14   ECHOCARDIOGRAM LIMITED  Result Date: 12/07/2019    ECHOCARDIOGRAM LIMITED REPORT   Patient Name:   CAYLEEN BENJAMIN Date of Exam: 12/07/2019 Medical Rec #:  [id number]        Height:  66.5 in Accession #:    [id number]       Weight:       139.3 lb Date of Birth:  1945-12-15        BSA:          1.725 m Patient Age:    25 years         BP:           119/63 mmHg Patient Gender: F                HR:           87 bpm. Exam Location:  [facility] Procedure: Limited Echo Indications:    Dyspnea 786.09 / R06.00  History:        Patient has prior history of Echocardiogram examinations, most                 recent 10/06/2019. COPD; Risk Factors:Dyslipidemia, Hypertension                 and Current Smoker. Acute respiratory failure, MITRAL                 REGURGITATION.  Sonographer:    Leavy Cella RDCS  (AE) Referring Phys: [id number] [place]  1. Left ventricular ejection fraction, by estimation, is 20%. Left ventricular endocardial border not optimally defined to evaluate regional wall motion.  2. The mitral valve is normal in structure. Moderate mitral valve regurgitation. No evidence of mitral stenosis.  3. There is moderately elevated pulmonary artery systolic pressure.  4. The inferior vena cava is dilated in size with >50% respiratory variability, suggesting right atrial pressure of 8 mmHg.  5. Limited echo FINDINGS  Left Ventricle: Left ventricular ejection fraction, by estimation, is 20%. Left ventricular endocardial border not optimally defined to evaluate regional wall motion. Right Ventricle: There is moderately elevated pulmonary artery systolic pressure. The tricuspid regurgitant velocity is 3.05 m/s, and with an assumed right atrial pressure of 10 mmHg, the estimated right ventricular systolic pressure is 26.7 mmHg. Pericardium: Trivial pericardial effusion is present. The pericardial effusion is circumferential. Mitral Valve: The mitral valve is normal in structure. Moderate mitral valve regurgitation. No evidence of mitral valve stenosis. Aortic Valve: Mild aortic valve annular calcification. There is mild thickening of the aortic valve. There is mild calcification of the aortic valve. Venous: The inferior vena cava is dilated in size with greater than 50% respiratory variability, suggesting right atrial pressure of 8 mmHg.  MITRAL VALVE               TRICUSPID VALVE MV Area (PHT): 3.12 cm    TR Peak grad:   37.2 mmHg MV Decel Time: 243 msec    TR Vmax:        305.00 cm/s MR Peak grad: 59.0 mmHg MR Mean grad: 40.0 mmHg MR Vmax:      384.00 cm/s MR Vmean:     304.0 cm/s MV E velocity: 81.50 cm/s MV A velocity: 63.50 cm/s MV E/A ratio:  1.28 Carlyle Dolly MD Electronically signed by Carlyle Dolly MD Signature Date/Time: 12/07/2019/4:43:05 PM    Final      Discharge  Exam: Vitals:   12/09/19 0722 12/09/19 0727  BP:    Pulse:    Resp:    Temp:    SpO2: 98% 98%   Vitals:   12/09/19 0300 12/09/19 0406 12/09/19 0722 12/09/19 0727  BP:  (!) 118/58    Pulse:  77  Resp:  17    Temp:  98.5 F (36.9 C)    TempSrc:  Oral    SpO2:  97% 98% 98%  Weight: 62.2 kg     Height:        General: Pt is alert, awake, not in acute distress Cardiovascular: RRR, S1/S2 +, no rubs, no gallops Respiratory: CTA bilaterally, no wheezing, no rhonchi Abdominal: Soft, NT, ND, bowel sounds + Extremities: no edema, no cyanosis    The results of significant diagnostics from this hospitalization (including imaging, microbiology, ancillary and laboratory) are listed below for reference.     Microbiology: Recent Results (from the past 240 hour(s))  SARS Coronavirus 2 by RT PCR (hospital order, performed in [facility] hospital lab) Nasopharyngeal Nasopharyngeal Swab     Status: None   Collection Time: 12/04/19 10:40 PM   Specimen: Nasopharyngeal Swab  Result Value Ref Range Status   SARS Coronavirus 2 NEGATIVE NEGATIVE Final    Comment: (NOTE) SARS-CoV-2 target nucleic acids are NOT DETECTED.  The SARS-CoV-2 RNA is generally detectable in upper and lower respiratory specimens during the acute phase of infection. The lowest concentration of SARS-CoV-2 viral copies this assay can detect is 250 copies / mL. A negative result does not preclude SARS-CoV-2 infection and should not be used as the sole basis for treatment or other patient management decisions.  A negative result may occur with improper specimen collection / handling, submission of specimen other than nasopharyngeal swab, presence of viral mutation(s) within the areas targeted by this assay, and inadequate number of viral copies (<250 copies / mL). A negative result must be combined with clinical observations, patient history, and epidemiological information.  Fact Sheet for Patients:    StrictlyIdeas.no  Fact Sheet for Healthcare Providers: BankingDealers.co.za  This test is not yet approved or  cleared by the [place] FDA and has been authorized for detection and/or diagnosis of SARS-CoV-2 by FDA under an Emergency Use Authorization (EUA).  This EUA will remain in effect (meaning this test can be used) for the duration of the COVID-19 declaration under Section 564(b)(1) of the Act, 21 U.S.C. section 360bbb-3(b)(1), unless the authorization is terminated or revoked sooner.  Performed at [facility], [address], [place], [place] [address]      Labs: BNP (last 3 results) Recent Labs    12/06/19 0442 12/07/19 0616 12/08/19 0825  BNP 3,718.0* 2,876.0* 4,008.6*   Basic Metabolic Panel: Recent Labs  Lab 12/05/19 0915 12/06/19 0442 12/07/19 0616 12/08/19 0825 12/09/19 0604  NA 138 137 138 138 138  K 3.4* 3.4* 4.3 3.9 3.5  CL 99 96* 97* 97* 96*  CO2 27 28 29 29 31   GLUCOSE 135* 152* 141* 142* 149*  BUN 10 21 42* 38* 52*  CREATININE 0.86 1.06* 1.33* 1.00 0.92  CALCIUM 8.6* 8.5* 8.8* 8.9 8.8*  MG  --   --  2.2 2.5* 2.4  PHOS  --   --  3.8 4.2 4.2   Liver Function Tests: Recent Labs  Lab 12/05/19 0915 12/07/19 0616 12/08/19 0825 12/09/19 0604  AST 17 16 16 17   ALT 12 12 14 16   ALKPHOS 65 55 49 45  BILITOT 0.9 0.7 0.4 0.6  PROT 6.6 6.4* 6.3* 6.1*  ALBUMIN 3.5 3.5 3.4* 3.4*   No results for input(s): LIPASE, AMYLASE in the last 168 hours. No results for input(s): AMMONIA in the last 168 hours. CBC: Recent Labs  Lab 12/05/19 0915 12/06/19 7619 12/07/19 5093 12/08/19 0825 12/09/19 2671  WBC 5.4 8.0 13.0* 9.0 9.1  NEUTROABS  --   --  12.0* 8.0* 8.0*  HGB 11.2* 10.3* 10.8* 10.4* 9.7*  HCT 37.0 33.2* 35.9* 34.4* 31.9*  MCV 89.8 89.0 90.0 91.0 90.1  PLT 186 175 205 185 187   Cardiac Enzymes: No results for input(s): CKTOTAL, CKMB, CKMBINDEX, TROPONINI in the last 168  hours. BNP: Invalid input(s): POCBNP CBG: No results for input(s): GLUCAP in the last 168 hours. D-Dimer No results for input(s): DDIMER in the last 72 hours. Hgb A1c No results for input(s): HGBA1C in the last 72 hours. Lipid Profile No results for input(s): CHOL, HDL, LDLCALC, TRIG, CHOLHDL, LDLDIRECT in the last 72 hours. Thyroid function studies No results for input(s): TSH, T4TOTAL, T3FREE, THYROIDAB in the last 72 hours.  Invalid input(s): FREET3 Anemia work up No results for input(s): VITAMINB12, FOLATE, FERRITIN, TIBC, IRON, RETICCTPCT in the last 72 hours. Urinalysis    Component Value Date/Time   COLORURINE YELLOW 11/13/2009 1557   APPEARANCEUR CLEAR 11/13/2009 1557   LABSPEC 1.010 11/13/2009 1557   PHURINE 6.0 11/13/2009 1557   GLUCOSEU NEGATIVE 11/13/2009 1557   HGBUR NEGATIVE 11/13/2009 1557   BILIRUBINUR NEGATIVE 11/13/2009 1557   KETONESUR NEGATIVE 11/13/2009 1557   PROTEINUR NEGATIVE 11/13/2009 1557   UROBILINOGEN 0.2 11/13/2009 1557   NITRITE NEGATIVE 11/13/2009 1557   LEUKOCYTESUR  11/13/2009 1557    NEGATIVE MICROSCOPIC NOT DONE ON URINES WITH NEGATIVE PROTEIN, BLOOD, LEUKOCYTES, NITRITE, OR GLUCOSE <1000 mg/dL.   Sepsis Labs Invalid input(s): PROCALCITONIN,  WBC,  LACTICIDVEN Microbiology Recent Results (from the past 240 hour(s))  SARS Coronavirus 2 by RT PCR (hospital order, performed in [facility] hospital lab) Nasopharyngeal Nasopharyngeal Swab     Status: None   Collection Time: 12/04/19 10:40 PM   Specimen: Nasopharyngeal Swab  Result Value Ref Range Status   SARS Coronavirus 2 NEGATIVE NEGATIVE Final    Comment: (NOTE) SARS-CoV-2 target nucleic acids are NOT DETECTED.  The SARS-CoV-2 RNA is generally detectable in upper and lower respiratory specimens during the acute phase of infection. The lowest concentration of SARS-CoV-2 viral copies this assay can detect is 250 copies / mL. A negative result does not preclude SARS-CoV-2 infection and  should not be used as the sole basis for treatment or other patient management decisions.  A negative result may occur with improper specimen collection / handling, submission of specimen other than nasopharyngeal swab, presence of viral mutation(s) within the areas targeted by this assay, and inadequate number of viral copies (<250 copies / mL). A negative result must be combined with clinical observations, patient history, and epidemiological information.  Fact Sheet for Patients:   StrictlyIdeas.no  Fact Sheet for Healthcare Providers: BankingDealers.co.za  This test is not yet approved or  cleared by the [place] FDA and has been authorized for detection and/or diagnosis of SARS-CoV-2 by FDA under an Emergency Use Authorization (EUA).  This EUA will remain in effect (meaning this test can be used) for the duration of the COVID-19 declaration under Section 564(b)(1) of the Act, 21 U.S.C. section 360bbb-3(b)(1), unless the authorization is terminated or revoked sooner.  Performed at [facility], [address] [place], [place] [address]      Time coordinating discharge: 35 minutes  SIGNED:   Rodena Goldmann, DO [facility] 12/09/2019, 10:04 AM  If 7PM-7AM, please contact night-coverage [email or web address]

## 2019-12-09 NOTE — Progress Notes (Addendum)
Nsg Discharge Note  Admit Date:  12/04/2019 Discharge date: 12/09/2019   [name] to be D/C'd Home per MD order.  AVS completed.  Patient and daughter [name] able to verbalize understanding.  Discharge Medication: Allergies as of 12/09/2019      Reactions   Gabapentin Swelling      Medication List    STOP taking these medications   carvedilol 6.25 MG tablet Commonly known as: COREG   lisinopril 40 MG tablet Commonly known as: ZESTRIL     TAKE these medications   albuterol 108 (90 Base) MCG/ACT inhaler Commonly known as: VENTOLIN HFA Inhale 2 puffs into the lungs every 6 (six) hours as needed for wheezing or shortness of breath.   aspirin 81 MG tablet Take 81 mg by mouth daily.   clopidogrel 75 MG tablet Commonly known as: PLAVIX Take 75 mg by mouth daily.   furosemide 40 MG tablet Commonly known as: LASIX Take 1 tablet (40 mg total) by mouth 2 (two) times daily. What changed: when to take this   guaiFENesin 600 MG 12 hr tablet Commonly known as: MUCINEX Take 1 tablet (600 mg total) by mouth 2 (two) times daily for 10 days.   metoprolol succinate 25 MG 24 hr tablet Commonly known as: TOPROL-XL Take 1 tablet (25 mg total) by mouth daily. Start taking on: December 10, 2019   potassium chloride 10 MEQ tablet Commonly known as: KLOR-CON Take 1 tablet (10 mEq total) by mouth 2 (two) times daily.   predniSONE 20 MG tablet Commonly known as: Deltasone Take 2 tablets (40 mg total) by mouth daily for 5 days.   simvastatin 80 MG tablet Commonly known as: ZOCOR Take 80 mg by mouth at bedtime.   spironolactone 25 MG tablet Commonly known as: ALDACTONE Take 0.5 tablets (12.5 mg total) by mouth daily.   traMADol 50 MG tablet Commonly known as: ULTRAM Take 50 mg by mouth every 6 (six) hours as needed.   traZODone 100 MG tablet Commonly known as: DESYREL Take 100 mg by mouth at bedtime as needed.       Discharge Assessment: Vitals:   12/09/19 0722 12/09/19  0727  BP:    Pulse:    Resp:    Temp:    SpO2: 98% 98%   Skin clean, dry and intact without evidence of skin break down, no evidence of skin tears noted. IV catheter discontinued intact. Site without signs and symptoms of complications - no redness or edema noted at insertion site, patient denies c/o pain - only slight tenderness at site.  Dressing with slight pressure applied.  D/c Instructions-Education: Discharge instructions given to patient and her daughter [name] with verbalized understanding. D/c education completed with patient and her daughter including follow up instructions, medication list, d/c activities limitations if indicated, with other d/c instructions as indicated by MD - patient able to verbalize understanding, all questions fully answered. Patient instructed to return to ED, call 911, or call MD for any changes in condition.  Patient escorted via [place], and D/C home via private auto.  Danyal Adorno Loletha Grayer, RN 12/09/2019 10:51 AM

## 2020-01-09 ENCOUNTER — Ambulatory Visit: Payer: Medicare Other | Admitting: Student

## 2020-01-09 NOTE — Progress Notes (Deleted)
Cardiology Office Note    Date:  01/09/2020   ID:  [name], [name] 09/03/1945, MRN [id number]  PCP:  Patient, No Pcp Per  Cardiologist: Carlyle Dolly, MD    No chief complaint on file.   History of Present Illness:    [name] is a 74 y.o. female with past medical history of chronic systolic CHF (EF 74% by echocardiogram in 09/2019), CAD (s/p BMS to RCA in 2010, cath in 09/2019 showing mild to moderate disease and patent RCA stent with minimal ISR), HTN, HLD, carotid artery stenosis (s/p L CEA in 2010), moderate to severe MR, COPD and history of breast cancer who presents to the office today for hospital follow-up.   She was most recently admitted to [facility] on 12/04/2019 for evaluation of worsening dyspnea and lower extremity edema, found to have an acute CHF exacerbation with BNP elevated to 2430.  She had previously been discharged on Coreg, Lasix, Entresto and spironolactone but reported not taking her medications regularly due to being unable to afford them.  She responded well to IV Lasix and weight declined to 139 LBS.  She was restarted on Toprol-XL and Spironolactone.  BP did not allow for the use of ACE-I or ARB and she was previously unable to afford Entresto. She was discharged on Lasix 40mg  BID.   Past Medical History:  Diagnosis Date  . Breast cancer ([place])   . Carotid artery stenosis   . COPD (chronic obstructive pulmonary disease) ([place])   . Coronary artery disease   . Depression   . Enlargement of lymph node   . GERD (gastroesophageal reflux disease)   . Hyperlipidemia   . Hypertension   . LBP (low back pain)   . Mitral regurgitation   . Osteoarthritis   . Sciatica   . Systolic congestive heart failure ([place])   . Tobacco user   . Urge urinary incontinence     Past Surgical History:  Procedure Laterality Date  . APPENDECTOMY     as a child  . BACK SURGERY  1995  . bare-metal stenting of critical mid ICA stenosis     w/ ulcerated  plaque/thrombus  . CAROTID ENDARTERECTOMY    . [facility]  . CHOLECYSTECTOMY  1980s  . MASTECTOMY  1992   L  . RIGHT/LEFT HEART CATH AND CORONARY ANGIOGRAPHY N/A 10/09/2019   Procedure: RIGHT/LEFT HEART CATH AND CORONARY ANGIOGRAPHY;  Surgeon: Nelva Bush, MD;  Location: [place] [facility];  Service: Cardiovascular;  Laterality: N/A;    Current Medications: Outpatient Medications Prior to Visit  Medication Sig Dispense Refill  . albuterol (VENTOLIN HFA) 108 (90 Base) MCG/ACT inhaler Inhale 2 puffs into the lungs every 6 (six) hours as needed for wheezing or shortness of breath.    Marland Kitchen aspirin 81 MG tablet Take 81 mg by mouth daily.    . clopidogrel (PLAVIX) 75 MG tablet Take 75 mg by mouth daily.      . furosemide (LASIX) 40 MG tablet Take 1 tablet (40 mg total) by mouth 2 (two) times daily. 60 tablet 2  . metoprolol succinate (TOPROL-XL) 25 MG 24 hr tablet Take 1 tablet (25 mg total) by mouth daily. 30 tablet 2  . potassium chloride (KLOR-CON) 10 MEQ tablet Take 1 tablet (10 mEq total) by mouth 2 (two) times daily. 60 tablet 2  . simvastatin (ZOCOR) 80 MG tablet Take 80 mg by mouth at bedtime.      Marland Kitchen spironolactone (  ALDACTONE) 25 MG tablet Take 0.5 tablets (12.5 mg total) by mouth daily. 15 tablet 0  . traMADol (ULTRAM) 50 MG tablet Take 50 mg by mouth every 6 (six) hours as needed.    . traZODone (DESYREL) 100 MG tablet Take 100 mg by mouth at bedtime as needed. (Patient not taking: Reported on 12/05/2019)     No facility-administered medications prior to visit.     Allergies:   Gabapentin   Social History   Socioeconomic History  . Marital status: Married    Spouse name: [name]  . Number of children: Y  . Years of education: Not on file  . Highest education level: Not on file  Occupational History  . 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  Tobacco Use  . Smoking status: Current  Every Day Smoker    Packs/day: 1.00    Years: 51.00    Pack years: 51.00    Types: Cigarettes  . Smokeless tobacco: Never Used  Substance and Sexual Activity  . Alcohol use: No  . Drug use: No  . Sexual activity: Not on file  Other Topics Concern  . Not on file  Social History Narrative   Lives with daughter, [name] and [name] and her husband, [name].   Social Determinants of Health   Financial Resource Strain:   . Difficulty of Paying Living Expenses:   Food Insecurity:   . Worried About Charity fundraiser in the Last Year:   . Arboriculturist in the Last Year:   Transportation Needs:   . Film/video editor (Medical):   Marland Kitchen Lack of Transportation (Non-Medical):   Physical Activity:   . Days of Exercise per Week:   . Minutes of Exercise per Session:   Stress:   . Feeling of Stress :   Social Connections:   . Frequency of Communication with Friends and Family:   . Frequency of Social Gatherings with Friends and Family:   . Attends Religious Services:   . Active Member of Clubs or Organizations:   . Attends Archivist Meetings:   Marland Kitchen Marital Status:      Family History:  The patient's ***family history includes Diabetes in her mother; Heart attack in her mother; Heart disease in her brother; Hypertension in her mother; Lung cancer in her father; Stroke in her brother and father.   Review of Systems:   Please see the history of present illness.     General:  No chills, fever, night sweats or weight changes.  Cardiovascular:  No chest pain, dyspnea on exertion, edema, orthopnea, palpitations, paroxysmal nocturnal dyspnea. Dermatological: No rash, lesions/masses Respiratory: No cough, dyspnea Urologic: No hematuria, dysuria Abdominal:   No nausea, vomiting, diarrhea, bright red blood per rectum, melena, or hematemesis Neurologic:  No visual changes, wkns, changes in mental status. All other systems reviewed and are otherwise negative except as  noted above.   Physical Exam:    VS:  There were no vitals taken for this visit.   General: Well developed, well nourished,female appearing in no acute distress. Head: Normocephalic, atraumatic, sclera non-icteric.  Neck: No carotid bruits. JVD not elevated.  Lungs: Respirations regular and unlabored, without wheezes or rales.  Heart: ***Regular rate and rhythm. No S3 or S4.  No murmur, no rubs, or gallops appreciated. Abdomen: Soft, non-tender, non-distended. No obvious abdominal masses. Msk:  Strength and tone appear normal for age. No  obvious joint deformities or effusions. Extremities: No clubbing or cyanosis. No edema.  Distal pedal pulses are 2+ bilaterally. Neuro: Alert and oriented X 3. Moves all extremities spontaneously. No focal deficits noted. Psych:  Responds to questions appropriately with a normal affect. Skin: No rashes or lesions noted  Wt Readings from Last 3 Encounters:  12/09/19 137 lb 1.6 oz (62.2 kg)  10/10/19 136 lb 4.8 oz (61.8 kg)  04/20/14 160 lb 12.8 oz (72.9 kg)        Studies/Labs Reviewed:   EKG:  EKG is*** ordered today.  The ekg ordered today demonstrates ***  Recent Labs: 12/08/2019: B Natriuretic Peptide 2,850.0 12/09/2019: ALT 16; BUN 52; Creatinine, Ser 0.92; Hemoglobin 9.7; Magnesium 2.4; Platelets 187; Potassium 3.5; Sodium 138   Lipid Panel    Component Value Date/Time   CHOL 107 10/07/2019 0719   TRIG 74 10/07/2019 0719   HDL 32 (L) 10/07/2019 0719   CHOLHDL 3.3 10/07/2019 0719   VLDL 15 10/07/2019 0719   LDLCALC 60 10/07/2019 0719   LDLDIRECT 94 12/30/2007 2059    Additional studies/ records that were reviewed today include:   Cardiac Catheterization: 10/2019 Conclusions: 1. Mild to moderate, non-obstructive coronary artery disease, including 15% ostial LMCA, 20% proximal and mid LAD, and 40% mid LCx stenoses, as well as 50% proximal and 30% mid/distal RCA lesions. 2. Aneurysm of proximal/mid RCA between proximal 50% stenosis  and previously placed stent.  The aneurysm appears larger compared with 2010. 3. Patent stent in the mid RCA with minimal in-stent restenosis. 4. Normal right and left heart filling pressures. 5. Mild pulmonary hypertension. 6. Normal Fick cardiac output/index.  Recommendations: 1. Optimize evidence-based medical therapy for acute systolic heart failure.  Reduction in left ventricular ejection fraction is out of proportion to the degree of coronary artery disease, consistent with non-ischemic cardiomyopathy. 2. Aggressive secondary prevention of coronary artery disease.   Limited Echo: 12/2019 IMPRESSIONS    1. Left ventricular ejection fraction, by estimation, is 20%. Left  ventricular endocardial border not optimally defined to evaluate regional  wall motion.  2. The mitral valve is normal in structure. Moderate mitral valve  regurgitation. No evidence of mitral stenosis.  3. There is moderately elevated pulmonary artery systolic pressure.  4. The inferior vena cava is dilated in size with >50% respiratory  variability, suggesting right atrial pressure of 8 mmHg.  5. Limited echo   Assessment:    No diagnosis found.   Plan:   In order of problems listed above:  1. ***    Medication Adjustments/Labs and Tests Ordered: Current medicines are reviewed at length with the patient today.  Concerns regarding medicines are outlined above.  Medication changes, Labs and Tests ordered today are listed in the Patient Instructions below. There are no Patient Instructions on file for this visit.   Signed, Erma Heritage, PA-C  01/09/2020 7:41 AM    [facility] [address] [place], [place] [address] Phone: [phone] Fax: [phone]

## 2020-02-26 ENCOUNTER — Institutional Professional Consult (permissible substitution): Payer: Medicare Other | Admitting: Pulmonary Disease

## 2020-03-21 ENCOUNTER — Other Ambulatory Visit: Payer: Self-pay

## 2020-03-21 ENCOUNTER — Inpatient Hospital Stay (HOSPITAL_COMMUNITY)
Admission: EM | Admit: 2020-03-21 | Discharge: 2020-03-28 | DRG: 291 | Disposition: A | Discharge status: Home / Self Care | Payer: Medicare Other | Attending: Family Medicine | Admitting: Family Medicine

## 2020-03-21 ENCOUNTER — Inpatient Hospital Stay (HOSPITAL_COMMUNITY): Payer: Medicare Other

## 2020-03-21 ENCOUNTER — Emergency Department (HOSPITAL_COMMUNITY): Payer: Medicare Other

## 2020-03-21 ENCOUNTER — Encounter (HOSPITAL_COMMUNITY): Payer: Self-pay | Admitting: Emergency Medicine

## 2020-03-21 DIAGNOSIS — Z20822 Contact with and (suspected) exposure to covid-19: Secondary | ICD-10-CM | POA: Diagnosis present

## 2020-03-21 DIAGNOSIS — Z833 Family history of diabetes mellitus: Secondary | ICD-10-CM | POA: Diagnosis not present

## 2020-03-21 DIAGNOSIS — I509 Heart failure, unspecified: Secondary | ICD-10-CM

## 2020-03-21 DIAGNOSIS — I272 Pulmonary hypertension, unspecified: Secondary | ICD-10-CM | POA: Diagnosis present

## 2020-03-21 DIAGNOSIS — E785 Hyperlipidemia, unspecified: Secondary | ICD-10-CM | POA: Diagnosis present

## 2020-03-21 DIAGNOSIS — J441 Chronic obstructive pulmonary disease with (acute) exacerbation: Secondary | ICD-10-CM | POA: Diagnosis present

## 2020-03-21 DIAGNOSIS — N179 Acute kidney failure, unspecified: Secondary | ICD-10-CM | POA: Diagnosis present

## 2020-03-21 DIAGNOSIS — H9190 Unspecified hearing loss, unspecified ear: Secondary | ICD-10-CM | POA: Diagnosis present

## 2020-03-21 DIAGNOSIS — R7989 Other specified abnormal findings of blood chemistry: Secondary | ICD-10-CM | POA: Diagnosis present

## 2020-03-21 DIAGNOSIS — Z8249 Family history of ischemic heart disease and other diseases of the circulatory system: Secondary | ICD-10-CM | POA: Diagnosis not present

## 2020-03-21 DIAGNOSIS — I5021 Acute systolic (congestive) heart failure: Secondary | ICD-10-CM | POA: Diagnosis present

## 2020-03-21 DIAGNOSIS — I447 Left bundle-branch block, unspecified: Secondary | ICD-10-CM | POA: Diagnosis present

## 2020-03-21 DIAGNOSIS — I083 Combined rheumatic disorders of mitral, aortic and tricuspid valves: Secondary | ICD-10-CM | POA: Diagnosis present

## 2020-03-21 DIAGNOSIS — J9691 Respiratory failure, unspecified with hypoxia: Secondary | ICD-10-CM | POA: Diagnosis present

## 2020-03-21 DIAGNOSIS — Z823 Family history of stroke: Secondary | ICD-10-CM

## 2020-03-21 DIAGNOSIS — K219 Gastro-esophageal reflux disease without esophagitis: Secondary | ICD-10-CM | POA: Diagnosis present

## 2020-03-21 DIAGNOSIS — E872 Acidosis: Secondary | ICD-10-CM | POA: Diagnosis present

## 2020-03-21 DIAGNOSIS — I110 Hypertensive heart disease with heart failure: Principal | ICD-10-CM | POA: Diagnosis present

## 2020-03-21 DIAGNOSIS — J9601 Acute respiratory failure with hypoxia: Secondary | ICD-10-CM | POA: Diagnosis present

## 2020-03-21 DIAGNOSIS — I5022 Chronic systolic (congestive) heart failure: Secondary | ICD-10-CM | POA: Diagnosis not present

## 2020-03-21 DIAGNOSIS — Z9049 Acquired absence of other specified parts of digestive tract: Secondary | ICD-10-CM | POA: Diagnosis not present

## 2020-03-21 DIAGNOSIS — I5043 Acute on chronic combined systolic (congestive) and diastolic (congestive) heart failure: Secondary | ICD-10-CM | POA: Insufficient documentation

## 2020-03-21 DIAGNOSIS — I493 Ventricular premature depolarization: Secondary | ICD-10-CM | POA: Diagnosis present

## 2020-03-21 DIAGNOSIS — F17210 Nicotine dependence, cigarettes, uncomplicated: Secondary | ICD-10-CM | POA: Diagnosis present

## 2020-03-21 DIAGNOSIS — I10 Essential (primary) hypertension: Secondary | ICD-10-CM | POA: Diagnosis present

## 2020-03-21 DIAGNOSIS — J810 Acute pulmonary edema: Secondary | ICD-10-CM | POA: Diagnosis present

## 2020-03-21 DIAGNOSIS — I428 Other cardiomyopathies: Secondary | ICD-10-CM | POA: Diagnosis present

## 2020-03-21 DIAGNOSIS — Z801 Family history of malignant neoplasm of trachea, bronchus and lung: Secondary | ICD-10-CM

## 2020-03-21 DIAGNOSIS — J449 Chronic obstructive pulmonary disease, unspecified: Secondary | ICD-10-CM | POA: Diagnosis present

## 2020-03-21 DIAGNOSIS — J9811 Atelectasis: Secondary | ICD-10-CM | POA: Diagnosis present

## 2020-03-21 DIAGNOSIS — D72829 Elevated white blood cell count, unspecified: Secondary | ICD-10-CM | POA: Insufficient documentation

## 2020-03-21 DIAGNOSIS — K761 Chronic passive congestion of liver: Secondary | ICD-10-CM | POA: Diagnosis present

## 2020-03-21 DIAGNOSIS — R062 Wheezing: Secondary | ICD-10-CM

## 2020-03-21 DIAGNOSIS — Z955 Presence of coronary angioplasty implant and graft: Secondary | ICD-10-CM | POA: Diagnosis not present

## 2020-03-21 DIAGNOSIS — Z7902 Long term (current) use of antithrombotics/antiplatelets: Secondary | ICD-10-CM

## 2020-03-21 DIAGNOSIS — Z79899 Other long term (current) drug therapy: Secondary | ICD-10-CM

## 2020-03-21 DIAGNOSIS — I2510 Atherosclerotic heart disease of native coronary artery without angina pectoris: Secondary | ICD-10-CM | POA: Diagnosis not present

## 2020-03-21 DIAGNOSIS — Z853 Personal history of malignant neoplasm of breast: Secondary | ICD-10-CM | POA: Diagnosis not present

## 2020-03-21 DIAGNOSIS — I340 Nonrheumatic mitral (valve) insufficiency: Secondary | ICD-10-CM | POA: Diagnosis not present

## 2020-03-21 DIAGNOSIS — J189 Pneumonia, unspecified organism: Secondary | ICD-10-CM

## 2020-03-21 DIAGNOSIS — Z7982 Long term (current) use of aspirin: Secondary | ICD-10-CM

## 2020-03-21 DIAGNOSIS — Z9114 Patient's other noncompliance with medication regimen: Secondary | ICD-10-CM

## 2020-03-21 HISTORY — DX: Heart failure, unspecified: I50.9

## 2020-03-21 LAB — COMPREHENSIVE METABOLIC PANEL
ALT: 144 U/L — ABNORMAL HIGH (ref 0–44)
AST: 258 U/L — ABNORMAL HIGH (ref 15–41)
Albumin: 3.6 g/dL (ref 3.5–5.0)
Alkaline Phosphatase: 100 U/L (ref 38–126)
BUN: 22 mg/dL (ref 8–23)
CO2: 12 mmol/L — ABNORMAL LOW (ref 22–32)
Calcium: 8.5 mg/dL — ABNORMAL LOW (ref 8.9–10.3)
Chloride: 98 mmol/L (ref 98–111)
Creatinine, Ser: 1.75 mg/dL — ABNORMAL HIGH (ref 0.44–1.00)
GFR, Estimated: 28 mL/min — ABNORMAL LOW (ref >60)
Glucose, Bld: 126 mg/dL — ABNORMAL HIGH (ref 70–99)
Potassium: 4.5 mmol/L (ref 3.5–5.1)
Sodium: 134 mmol/L — ABNORMAL LOW (ref 135–145)
Total Bilirubin: 0.9 mg/dL (ref 0.3–1.2)
Total Protein: 7 g/dL (ref 6.5–8.1)

## 2020-03-21 LAB — CBC WITH DIFFERENTIAL/PLATELET
Abs Immature Granulocytes: 0.36 10*3/uL — ABNORMAL HIGH (ref 0.00–0.07)
Basophils Absolute: 0.1 10*3/uL (ref 0.0–0.1)
Basophils Relative: 1 %
Eosinophils Absolute: 0 10*3/uL (ref 0.0–0.5)
Eosinophils Relative: 0 %
HCT: 36.9 % (ref 36.0–46.0)
Hemoglobin: 10.5 g/dL — ABNORMAL LOW (ref 12.0–15.0)
Immature Granulocytes: 2 %
Lymphocytes Relative: 28 %
Lymphs Abs: 4.7 10*3/uL — ABNORMAL HIGH (ref 0.7–4.0)
MCH: 24.4 pg — ABNORMAL LOW (ref 26.0–34.0)
MCHC: 28.5 g/dL — ABNORMAL LOW (ref 30.0–36.0)
MCV: 85.8 fL (ref 80.0–100.0)
Monocytes Absolute: 1.2 10*3/uL — ABNORMAL HIGH (ref 0.1–1.0)
Monocytes Relative: 7 %
Neutro Abs: 10.3 10*3/uL — ABNORMAL HIGH (ref 1.7–7.7)
Neutrophils Relative %: 62 %
Platelets: 273 10*3/uL (ref 150–400)
RBC: 4.3 MIL/uL (ref 3.87–5.11)
RDW: 21.7 % — ABNORMAL HIGH (ref 11.5–15.5)
WBC: 16.6 10*3/uL — ABNORMAL HIGH (ref 4.0–10.5)
nRBC: 0.4 % — ABNORMAL HIGH (ref 0.0–0.2)

## 2020-03-21 LAB — PROTIME-INR
INR: 1.4 — ABNORMAL HIGH (ref 0.8–1.2)
Prothrombin Time: 16.7 seconds — ABNORMAL HIGH (ref 11.4–15.2)

## 2020-03-21 LAB — CBC
HCT: 32.1 % — ABNORMAL LOW (ref 36.0–46.0)
Hemoglobin: 9.6 g/dL — ABNORMAL LOW (ref 12.0–15.0)
MCH: 23.7 pg — ABNORMAL LOW (ref 26.0–34.0)
MCHC: 29.9 g/dL — ABNORMAL LOW (ref 30.0–36.0)
MCV: 79.3 fL — ABNORMAL LOW (ref 80.0–100.0)
Platelets: 199 10*3/uL (ref 150–400)
RBC: 4.05 MIL/uL (ref 3.87–5.11)
RDW: 21 % — ABNORMAL HIGH (ref 11.5–15.5)
WBC: 16 10*3/uL — ABNORMAL HIGH (ref 4.0–10.5)
nRBC: 0.2 % (ref 0.0–0.2)

## 2020-03-21 LAB — RESPIRATORY PANEL BY RT PCR (FLU A&B, COVID)
Influenza A by PCR: NEGATIVE
Influenza B by PCR: NEGATIVE
SARS Coronavirus 2 by RT PCR: NEGATIVE

## 2020-03-21 LAB — ECHOCARDIOGRAM COMPLETE
Area-P 1/2: 5.34 cm2
MV M vel: 5.04 m/s
MV Peak grad: 101.6 mmHg
Radius: 0.7 cm
S' Lateral: 4.4 cm

## 2020-03-21 LAB — TROPONIN I (HIGH SENSITIVITY)
Troponin I (High Sensitivity): 81 ng/L — ABNORMAL HIGH (ref <18)
Troponin I (High Sensitivity): 90 ng/L — ABNORMAL HIGH (ref <18)

## 2020-03-21 LAB — APTT: aPTT: 30 seconds (ref 24–36)

## 2020-03-21 LAB — CBG MONITORING, ED: Glucose-Capillary: 114 mg/dL — ABNORMAL HIGH (ref 70–99)

## 2020-03-21 LAB — MAGNESIUM: Magnesium: 2.5 mg/dL — ABNORMAL HIGH (ref 1.7–2.4)

## 2020-03-21 LAB — TSH: TSH: 1.814 u[IU]/mL (ref 0.350–4.500)

## 2020-03-21 LAB — BRAIN NATRIURETIC PEPTIDE: B Natriuretic Peptide: >4500 pg/mL — ABNORMAL HIGH (ref 0.0–100.0)

## 2020-03-21 MED ORDER — HEPARIN SODIUM (PORCINE) 5000 UNIT/ML IJ SOLN
5000.0000 [IU] | Freq: Three times a day (TID) | INTRAMUSCULAR | Status: DC
  Administered 2020-03-21 – 2020-03-28 (×21): 5000 [IU] via SUBCUTANEOUS
  Filled 2020-03-21 (×21): qty 1

## 2020-03-21 MED ORDER — ACETAMINOPHEN 325 MG PO TABS
650.0000 mg | ORAL_TABLET | ORAL | Status: DC | PRN
  Administered 2020-03-21 – 2020-03-26 (×4): 650 mg via ORAL
  Filled 2020-03-21 (×4): qty 2

## 2020-03-21 MED ORDER — ASPIRIN EC 81 MG PO TBEC
81.0000 mg | DELAYED_RELEASE_TABLET | Freq: Every day | ORAL | Status: DC
  Administered 2020-03-21 – 2020-03-28 (×8): 81 mg via ORAL
  Filled 2020-03-21 (×8): qty 1

## 2020-03-21 MED ORDER — ONDANSETRON HCL 4 MG/2ML IJ SOLN: 4.0000 mg | Freq: Four times a day (QID) | INTRAMUSCULAR | Status: DC | PRN

## 2020-03-21 MED ORDER — TRAMADOL HCL 50 MG PO TABS
50.0000 mg | ORAL_TABLET | Freq: Four times a day (QID) | ORAL | Status: DC | PRN
  Administered 2020-03-22 – 2020-03-24 (×7): 50 mg via ORAL
  Filled 2020-03-21 (×7): qty 1

## 2020-03-21 MED ORDER — ALBUTEROL SULFATE HFA 108 (90 BASE) MCG/ACT IN AERS
6.0000 | INHALATION_SPRAY | Freq: Once | RESPIRATORY_TRACT | Status: AC
  Administered 2020-03-21: 6 via RESPIRATORY_TRACT
  Filled 2020-03-21: qty 6.7

## 2020-03-21 MED ORDER — FUROSEMIDE 10 MG/ML IJ SOLN
60.0000 mg | Freq: Two times a day (BID) | INTRAMUSCULAR | Status: DC
  Administered 2020-03-21: 60 mg via INTRAVENOUS
  Filled 2020-03-21: qty 6

## 2020-03-21 MED ORDER — SODIUM CHLORIDE 0.9 % IV SOLN: 250.0000 mL | INTRAVENOUS | Status: DC | PRN

## 2020-03-21 MED ORDER — SODIUM CHLORIDE 0.9% FLUSH: 3.0000 mL | INTRAVENOUS | Status: DC | PRN

## 2020-03-21 MED ORDER — SODIUM CHLORIDE 0.9% FLUSH
3.0000 mL | Freq: Two times a day (BID) | INTRAVENOUS | Status: DC
  Administered 2020-03-21 – 2020-03-28 (×13): 3 mL via INTRAVENOUS

## 2020-03-21 MED ORDER — FUROSEMIDE 10 MG/ML IJ SOLN
60.0000 mg | Freq: Once | INTRAMUSCULAR | Status: AC
  Administered 2020-03-21: 60 mg via INTRAMUSCULAR
  Filled 2020-03-21: qty 6

## 2020-03-21 NOTE — ED Notes (Signed)
Respiratory notified of need for Bipap

## 2020-03-21 NOTE — ED Triage Notes (Signed)
[name] called EMS for sob.   Pt found to be sob, HX of HF. EMS placed pt on CPAP with good response. Pts sat low 90's

## 2020-03-21 NOTE — Progress Notes (Signed)
**Note De-Identified  Obfuscation** Patient removed from BIPAP and placed on 4 L [place]; tolerating well at this time. RRT to monitor.

## 2020-03-21 NOTE — H&P (Signed)
History and Physical  TRINE FREAD XBD:[id number] DOB: 1945-11-03 DOA: 03/21/2020  Referring physician: Dr. Nanda Quinton  PCP: Patient, No Pcp Per  Outpatient Specialists: Card[facility] [place] Patient coming from:Home  Chief Complaint: worsening SOB   HPI: [name] is a 74 year old female with medical history significant for chronic systolic CHF with EF of 83% (TTE on 12/2019), HTN, HLD, COPD who presented to the ED on 10/14 with complaints of worsening shortness of breath over several weeks but particular over the last few days.  Patient unable to provide much history as she is currently receiving BiPAP mask during examination and review of chart and discussing with the ED provider patient's daughter called EMS for shortness of breath.  At that time EMS placed patient on CPAP with good response and noted oxygen saturation in the 90s.  In further history,  from her daughter she states patient was possibly seen by her PCP about a month ago but was not feeling well.  Her primary care doctor sent in a prescription for some of her medications but daughter reports that some of those "important medications for her heart" when able to be refilled because they were not due yet, but daughter reports that her mother did not have any left.  Per pharmacy med reconciliation his medications seem to be Lasix, Toprol, Aldactone  ED COURSE: Patient was afebrile tachypneic at a rate of 36, initially on 15 L but was transitioned to BiPAP with improvement in respiratory status.  Lab work was notable for initial troponin of 81 which increased to 90, sodium of 134, creatinine of 1.75, CO2 of 12, AST 258, ALT 144.  BNP was elevated at greater than 4500.  WBC was 16.6, hemoglobin 10.5 consistent with baseline. Covid test was negative.  Patient underwent chest x-ray which showed pulmonary edema and possible left pleural effusion as well as possible atelectasis or developing infiltrate in questionable pericardial  effusion.  Due to elevated LFTs patient also underwent abdominal ultrasound which showed 11 mm proximal common bile duct with no obstructing lesion and concern for either hepatic steatosis or chronic hepatic disease with right-sided pleural effusion.  Patient was continued on BiPAP, given IV Lasix 60 mg x 1 and TRH was called for further management  Review of Systems:As mentioned in the history of present illness.Review of systems are otherwise negative    Past Medical History:  Diagnosis Date  . Breast cancer ([place])   . Carotid artery stenosis   . COPD (chronic obstructive pulmonary disease) ([place])   . Coronary artery disease   . Depression   . Enlargement of lymph node   . GERD (gastroesophageal reflux disease)   . Hyperlipidemia   . Hypertension   . LBP (low back pain)   . Mitral regurgitation   . Osteoarthritis   . Sciatica   . Systolic congestive heart failure ([place])   . Tobacco user   . Urge urinary incontinence    Past Surgical History:  Procedure Laterality Date  . APPENDECTOMY     as a child  . BACK SURGERY  1995  . bare-metal stenting of critical mid ICA stenosis     w/ ulcerated plaque/thrombus  . CAROTID ENDARTERECTOMY    . [place]  . CHOLECYSTECTOMY  1980s  . MASTECTOMY  1992   L  . RIGHT/LEFT HEART CATH AND CORONARY ANGIOGRAPHY N/A 10/09/2019   Procedure: RIGHT/LEFT HEART CATH AND CORONARY ANGIOGRAPHY;  Surgeon: Nelva Bush, MD;  Location: [facility];  Service: Cardiovascular;  Laterality: N/A;   Allergies  Allergen Reactions  . Gabapentin Swelling   Social History:  reports that she has been smoking cigarettes. She has a 51.00 pack-year smoking history. She has never used smokeless tobacco. She reports that she does not drink alcohol and does not use drugs. Family History  Problem Relation Age of Onset  . Diabetes Mother   . Heart attack Mother        deceased at age 49  . Hypertension Mother   . Lung cancer Father          deceased at age 5  . Stroke Father   . Stroke Brother        deceased at age 106s  . Heart disease Brother       Prior to Admission medications   Medication Sig Start Date End Date Taking? Authorizing Provider  albuterol (VENTOLIN HFA) 108 (90 Base) MCG/ACT inhaler Inhale 2 puffs into the lungs every 6 (six) hours as needed for wheezing or shortness of breath. 05/01/19 04/30/20 Yes [provider]  aspirin 81 MG tablet Take 81 mg by mouth daily.   Yes [provider]  furosemide (LASIX) 40 MG tablet Take 1 tablet (40 mg total) by mouth 2 (two) times daily. 12/09/19 03/21/20 Yes Shah, Pratik D, DO  metoprolol succinate (TOPROL-XL) 25 MG 24 hr tablet Take 1 tablet (25 mg total) by mouth daily. 12/10/19 03/21/20 Yes Shah, Pratik D, DO  potassium chloride (KLOR-CON) 10 MEQ tablet Take 1 tablet (10 mEq total) by mouth 2 (two) times daily. 12/09/19 03/21/20 Yes Shah, Pratik D, DO  simvastatin (ZOCOR) 80 MG tablet Take 80 mg by mouth at bedtime.     Yes [provider]  spironolactone (ALDACTONE) 25 MG tablet Take 0.5 tablets (12.5 mg total) by mouth daily. 10/11/19 03/21/20 Yes Ghimire, Dante Gang, MD  traMADol (ULTRAM) 50 MG tablet Take 50 mg by mouth every 6 (six) hours as needed. 09/07/19  Yes [provider]  clopidogrel (PLAVIX) 75 MG tablet Take 75 mg by mouth daily.   Patient not taking: Reported on 03/21/2020    [provider]  traZODone (DESYREL) 100 MG tablet Take 100 mg by mouth at bedtime as needed. Patient not taking: Reported on 12/05/2019 05/01/19   [provider]  buPROPion (WELLBUTRIN XL) 150 MG 24 hr tablet Take 1 tablet (150 mg total) by mouth daily. 01/26/11 08/28/11  Josue Hector, MD  fluticasone (FLONASE) 50 MCG/ACT nasal spray Place 2 sprays into the nose daily.    08/28/11  [provider]    Physical Exam: BP 104/73   Pulse 97   Temp 99.1 F (37.3 C)   Resp 19   SpO2 100%   Constitutional normal appearing  female, BiPAP mask in place Eyes: EOMI, anicteric, normal conjunctivae ENMT: Dry oral mucosa Cardiovascular: RRR no MRGs,  no edema lower extremities Respiratory: Normal respiratory effort on BiPAP, crackles heard at bases bilaterally, no wheezing  Abdomen: abdomen soft, slightly distended, Skin: No rash ulcers, or lesions. Without skin tenting  Neurologic: Grossly no focal neuro deficit. Psychiatric:Appropriate affect, and mood. Mental status AAOx3          Labs on Admission:  Basic Metabolic Panel: Recent Labs  Lab 03/21/20 0954 03/21/20 1547  NA 134*  --   K 4.5  --   CL 98  --   CO2 12*  --   GLUCOSE 126*  --   BUN 22  --  CREATININE 1.75*  --   CALCIUM 8.5*  --   MG  --  2.5*   Liver Function Tests: Recent Labs  Lab 03/21/20 0954  AST 258*  ALT 144*  ALKPHOS 100  BILITOT 0.9  PROT 7.0  ALBUMIN 3.6   No results for input(s): LIPASE, AMYLASE in the last 168 hours. No results for input(s): AMMONIA in the last 168 hours. CBC: Recent Labs  Lab 03/21/20 0954  WBC 16.6*  NEUTROABS 10.3*  HGB 10.5*  HCT 36.9  MCV 85.8  PLT 273   Cardiac Enzymes: No results for input(s): CKTOTAL, CKMB, CKMBINDEX, TROPONINI in the last 168 hours.  BNP (last 3 results) Recent Labs    12/07/19 0616 12/08/19 0825 03/21/20 0954  BNP 2,876.0* 2,850.0* >4,500.0*    ProBNP (last 3 results) No results for input(s): PROBNP in the last 8760 hours.  CBG: Recent Labs  Lab 03/21/20 0946  GLUCAP 114*    Radiological Exams on Admission: DG Chest Portable 1 View  Result Date: 03/21/2020 CLINICAL DATA:  Shortness of breath, increased shortness of breath with COPD EXAM: PORTABLE CHEST 1 VIEW COMPARISON:  December 07, 2019 FINDINGS: Trachea midline. Cardiomediastinal contours remain markedly enlarged. Configuration of the heart raising the question of pericardial effusion though not significantly changed from prior study. Aortic atherosclerosis in the aortic arch. Partially obscured  LEFT hemidiaphragm. Increased interstitial markings without lobar consolidation. Limited assessment of skeletal structures without acute process. IMPRESSION: 1. Marked enlargement of the cardiomediastinal contours with findings of pulmonary edema and possible LEFT effusion. 2. Partially obscured LEFT hemidiaphragm may represent atelectasis or developing infiltrate. 3. Cardiac configuration raising the question of pericardial effusion though not significantly changed when compared to the previous exam. Electronically Signed   By: Zetta Bills M.D.   On: 03/21/2020 10:27   US Abdomen Limited RUQ  Result Date: 03/21/2020 CLINICAL DATA:  Increased LFTs. EXAM: ULTRASOUND ABDOMEN LIMITED RIGHT UPPER QUADRANT COMPARISON:  None. FINDINGS: Gallbladder: Status post cholecystectomy. Common bile duct: Diameter: 11 mm, which is dilated. Liver: No focal lesion identified, although evaluation is limited secondary to poor sonographic window. Mildly increased echogenicity of the liver diffusely. Portal vein is patent on color Doppler imaging with normal direction of blood flow towards the liver. Other: Right pleural effusion. IMPRESSION: 1. The proximal common bile duct is dilated, measuring 11 mm. No evidence of proximal obstructing lesion; however, the distal common bile duct is not well evaluated sonographically. If there is concern for obstruction, MRI/MRCP or CT could further evaluate. 2. Mildly increased echogenicity of the liver diffusely, which may relate to hepatic steatosis or chronic hepatic disease. 3. Right pleural effusion. 4. Status post cholecystectomy. Electronically Signed   By: Margaretha Sheffield MD   On: 03/21/2020 13:27    EKG: Independently reviewed. unchanged from previous tracings.  Assessment/Plan Present on Admission: . Acute pulmonary edema (HCC) . Acute respiratory failure with hypoxemia ([place]) . Acute systolic CHF (congestive heart failure) ([place]) . COPD (chronic obstructive pulmonary  disease) ([place]) . COPD exacerbation ([place]) . Hypertension . Elevated LFTs . AKI (acute kidney injury) ([place]) . Leukocytosis . Acute respiratory failure with hypoxia (HCC)  Active Problems:   Hypertension   Acute on chronic congestive heart failure (HCC)   COPD exacerbation (HCC)   COPD (chronic obstructive pulmonary disease) (HCC)   Acute systolic CHF (congestive heart failure) (HCC)   Acute respiratory failure with hypoxemia (HCC)   Acute pulmonary edema (HCC)   Elevated LFTs   AKI (acute kidney injury) ([place])  Leukocytosis   Acute respiratory failure with hypoxia (HCC)    Acute hypoxic respiratory failure secondary to exacerbation of CHF with reduced EF.  Suspect in the setting of poor adherence as recent office visit notes report patient had not been able to afford some of her home medications as outpatient, and daughter reports some discrepancy been able to refill her home medications.  She needed upwards of 15 L of O2 and was able to transition to BiPAP with significant improvement in breathing status, suspect CHF main driver given BNP greater than 4500, pulmonary edema/pleural effusion on chest x-ray, and patient reporting abdominal distention, s.  EKG shows no acute ischemic changes and patient without chest pain so doubt that as a potential etiology -We will continue IV Lasix 60 mg twice daily, fluid restrict, low-sodium diet -Daily weights -Cardiology consulted -Obtain TTE to assess EF and also ensure no pericardial effusion is noted on chest x-ray  Elevated troponin, likely demand ischemia in the setting of CHF exacerbation.  No acute ischemic changes on EKG, and no chest pain -Continue to trend troponin  Elevated LFTs likely related to hepatic congestion from CHF exacerbation.  Right upper quadrant ultrasound with no obvious obstructive stone on CBD, if patient starts having abdominal pain that is not improved with IV Lasix can pursue MRCP for further evaluation -Check  hepatitis panel -Trend CMP  AKI in setting of CHF exacerbation.  Creatinine elevated from baseline currently 1.75, likely related to decreased effective arterial volume related to CHF saturation. -Avoid nephrotoxins -Monitor BMP, and output    DVT prophylaxis: Heparin  Code Status: Full code, discussed on day of admission  Family Communication: Called and discussed with daughter on the phone on 10/14  Disposition Plan: Plan to go to stepdown unit for close monitoring given need for BiPAP  Consults called: [place] cardiology  Admission status: Admitted as inpatient to stepdown unit for close monitoring respiratory status while requiring BiPAP, continue IV Lasix, close monitoring of troponin parameters, as well as LFTs      Desiree Hane MD [facility]  Pager [phone]  If 7PM-7AM, please contact night-coverage [email or web address] Password [facility]  03/21/2020, 4:36 PM

## 2020-03-21 NOTE — ED Notes (Signed)
ED Provider at bedside. 

## 2020-03-21 NOTE — ED Provider Notes (Signed)
Emergency Department Provider Note   I have reviewed the triage vital signs and the nursing notes.   HISTORY  Chief Complaint Shortness of Breath   HPI [name] is a 74 y.o. female with PMH of COPD and CHF presents to the ED with acute onset SOB. She denies active chest pain. No fever or chills.   Level 5 caveat: respiratory distress.   Past Medical History:  Diagnosis Date  . Breast cancer ([place])   . Carotid artery stenosis   . COPD (chronic obstructive pulmonary disease) ([place])   . Coronary artery disease   . Depression   . Enlargement of lymph node   . GERD (gastroesophageal reflux disease)   . Hyperlipidemia   . Hypertension   . LBP (low back pain)   . Mitral regurgitation   . Osteoarthritis   . Sciatica   . Systolic congestive heart failure ([place])   . Tobacco user   . Urge urinary incontinence     Patient Active Problem List   Diagnosis Date Noted  . Elevated LFTs 03/21/2020  . AKI (acute kidney injury) ([place]) 03/21/2020  . Leukocytosis 03/21/2020  . Acute respiratory failure with hypoxia ([place]) 03/21/2020  . Hypoxia   . Acute pulmonary edema (HCC)   . Acute respiratory failure with hypoxemia ([place]) 12/05/2019  . COPD exacerbation ([place]) 10/06/2019  . Acute systolic CHF (congestive heart failure) ([place]) 10/06/2019  . Acute systolic congestive heart failure ([place])   . COPD (chronic obstructive pulmonary disease) ([place])   . MITRAL REGURGITATION 03/26/2009  . Coronary atherosclerosis 03/26/2009  . Bilateral carotid bruits 03/26/2009  . Tobacco abuse 02/23/2009  . DEPRESSION 09/04/2007  . Acute on chronic congestive heart failure ([place]) 09/04/2007  . GERD 09/04/2007  . OSTEOARTHRITIS 09/04/2007  . LOW BACK PAIN 09/04/2007  . Hyperlipidemia 09/02/2007  . Hypertension 09/02/2007  . Chronic diastolic heart failure ([place]) 09/02/2007  . COPD with chronic bronchitis ([place]) 09/02/2007  . URINARY INCONTINENCE, URGE 09/02/2007    Past Surgical History:    Procedure Laterality Date  . APPENDECTOMY     as a child  . BACK SURGERY  1995  . bare-metal stenting of critical mid ICA stenosis     w/ ulcerated plaque/thrombus  . CAROTID ENDARTERECTOMY    . [place]  . CHOLECYSTECTOMY  1980s  . MASTECTOMY  1992   L  . RIGHT/LEFT HEART CATH AND CORONARY ANGIOGRAPHY N/A 10/09/2019   Procedure: RIGHT/LEFT HEART CATH AND CORONARY ANGIOGRAPHY;  Surgeon: Nelva Bush, MD;  Location: [facility];  Service: Cardiovascular;  Laterality: N/A;    Allergies Gabapentin  Family History  Problem Relation Age of Onset  . Diabetes Mother   . Heart attack Mother        deceased at age 51  . Hypertension Mother   . Lung cancer Father        deceased at age 41  . Stroke Father   . Stroke Brother        deceased at age 52s  . Heart disease Brother     Social History Social History   Tobacco Use  . Smoking status: Current Every Day Smoker    Packs/day: 1.00    Years: 51.00    Pack years: 51.00    Types: Cigarettes  . Smokeless tobacco: Never Used  Substance Use Topics  . Alcohol use: No  . Drug use: No    Review of Systems  Constitutional: No fever/chills Eyes: No  visual changes. ENT: No sore throat. Cardiovascular: Denies chest pain. Respiratory: Positive shortness of breath. Gastrointestinal: No abdominal pain.  No nausea, no vomiting.  No diarrhea.  No constipation. Genitourinary: Negative for dysuria. Musculoskeletal: Negative for back pain. Skin: Negative for rash. Neurological: Negative for headaches.  10-point ROS otherwise negative.  ____________________________________________   PHYSICAL EXAM:  VITAL SIGNS: ED Triage Vitals [03/21/20 0944]  Enc Vitals Group     BP (!) 137/102     Pulse Rate (!) 107     Resp (!) 22     Temp 97.7 F (36.5 C)     Temp src      SpO2 100 %   Constitutional: Alert but in acute respiratory distress.  Eyes: Conjunctivae are normal.  Head:  Atraumatic. Nose: No congestion/rhinnorhea. Mouth/Throat: Mucous membranes are moist.   Cardiovascular: Tachycardia. Good peripheral circulation. Grossly normal heart sounds.   Respiratory: Increased respiratory effort.  Positive mild retractions. Lungs with diminished sounds at the bases and better aeration at the apices with mild end-expiratory wheezing.  Gastrointestinal: Soft and nontender. No distention.  Musculoskeletal: 1+ pitting edema bilaterally.  Neurologic:  Normal speech and language.  Skin:  Skin is warm, dry and intact. No rash noted.  ____________________________________________   LABS (all labs ordered are listed, but only abnormal results are displayed)  Labs Reviewed  COMPREHENSIVE METABOLIC PANEL - Abnormal; Notable for the following components:      Result Value   Sodium 134 (*)    CO2 12 (*)    Glucose, Bld 126 (*)    Creatinine, Ser 1.75 (*)    Calcium 8.5 (*)    AST 258 (*)    ALT 144 (*)    GFR, Estimated 28 (*)    All other components within normal limits  BRAIN NATRIURETIC PEPTIDE - Abnormal; Notable for the following components:   B Natriuretic Peptide >4,500.0 (*)    All other components within normal limits  CBC WITH DIFFERENTIAL/PLATELET - Abnormal; Notable for the following components:   WBC 16.6 (*)    Hemoglobin 10.5 (*)    MCH 24.4 (*)    MCHC 28.5 (*)    RDW 21.7 (*)    nRBC 0.4 (*)    Neutro Abs 10.3 (*)    Lymphs Abs 4.7 (*)    Monocytes Absolute 1.2 (*)    Abs Immature Granulocytes 0.36 (*)    All other components within normal limits  CBG MONITORING, ED - Abnormal; Notable for the following components:   Glucose-Capillary 114 (*)    All other components within normal limits  TROPONIN I (HIGH SENSITIVITY) - Abnormal; Notable for the following components:   Troponin I (High Sensitivity) 81 (*)    All other components within normal limits  RESPIRATORY PANEL BY RT PCR (FLU A&B, COVID)  TROPONIN I (HIGH SENSITIVITY)    ____________________________________________  EKG   EKG Interpretation  Date/Time:  Thursday March 21 2020 09:44:56 EDT Ventricular Rate:  106 PR Interval:    QRS Duration: 168 QT Interval:  371 QTC Calculation: 493 R Axis:   -75 Text Interpretation: Sinus rhythm. Nonspecific IVCD with LAD Left ventricular hypertrophy Similar to June 2021 tracing. No STEMI Confirmed by Nanda Quinton [phone]) on 03/21/2020 9:52:55 AM       ____________________________________________  RADIOLOGY  DG Chest Portable 1 View  Result Date: 03/21/2020 CLINICAL DATA:  Shortness of breath, increased shortness of breath with COPD EXAM: PORTABLE CHEST 1 VIEW COMPARISON:  December 07, 2019 FINDINGS: Trachea midline.  Cardiomediastinal contours remain markedly enlarged. Configuration of the heart raising the question of pericardial effusion though not significantly changed from prior study. Aortic atherosclerosis in the aortic arch. Partially obscured LEFT hemidiaphragm. Increased interstitial markings without lobar consolidation. Limited assessment of skeletal structures without acute process. IMPRESSION: 1. Marked enlargement of the cardiomediastinal contours with findings of pulmonary edema and possible LEFT effusion. 2. Partially obscured LEFT hemidiaphragm may represent atelectasis or developing infiltrate. 3. Cardiac configuration raising the question of pericardial effusion though not significantly changed when compared to the previous exam. Electronically Signed   By: Zetta Bills M.D.   On: 03/21/2020 10:27    ____________________________________________   PROCEDURES  Procedure(s) performed:   .Critical Care Performed by: Margette Fast, MD Authorized by: Margette Fast, MD   Critical care provider statement:    Critical care time (minutes):  45   Critical care time was exclusive of:  Separately billable procedures and treating other patients and teaching time   Critical care was necessary to  treat or prevent imminent or life-threatening deterioration of the following conditions:  Respiratory failure   Critical care was time spent personally by me on the following activities:  Discussions with consultants, evaluation of patient's response to treatment, examination of patient, ordering and performing treatments and interventions, ordering and review of laboratory studies, ordering and review of radiographic studies, pulse oximetry, re-evaluation of patient's condition, obtaining history from patient or surrogate, review of old charts, blood draw for specimens and development of treatment plan with patient or surrogate   I assumed direction of critical care for this patient from another provider in my specialty: no      ____________________________________________   INITIAL IMPRESSION / ASSESSMENT AND PLAN / ED COURSE  Pertinent labs & imaging results that were available during my care of the patient were reviewed by me and considered in my medical decision making (see chart for details).   Patient presents to the emergency department with acute respiratory distress. She has had diminished sounds in the bases as well as wheezing. Plan for albuterol, Lasix, supplemental oxygen. I will send Covid testing and transition the patient to BiPAP if needed.   Patient transitioned to BiPAP after NRB trail and continued to look in distress. BiPAP started and on re-assessment the patient is looking much more comfortable. Labs show significantly elevated BNP and CXR which correlates with this findings. COVID and flu negative. Lasix and albuterol given. Will follow serial troponins with initial finding elevated but suspect this is demand related rather than primary ACS. Low suspicion for PE.   Discussed patient's case with [facility] to request admission. Patient and family (if present) updated with plan. Care transferred to [facility] service.  I reviewed all nursing notes, vitals, pertinent old records, EKGs,  labs, imaging (as available).  ____________________________________________  FINAL CLINICAL IMPRESSION(S) / ED DIAGNOSES  Final diagnoses:  Elevated LFTs  Acute respiratory failure with hypoxia (HCC)     MEDICATIONS GIVEN DURING THIS VISIT:  Medications  albuterol (VENTOLIN HFA) 108 (90 Base) MCG/ACT inhaler 6 puff (6 puffs Inhalation Given 03/21/20 0956)  furosemide (LASIX) injection 60 mg (60 mg Intramuscular Given 03/21/20 0958)    Note:  This document was prepared using Dragon voice recognition software and may include unintentional dictation errors.  Nanda Quinton, MD, [facility] Emergency Medicine    Yuko Coventry, [facility], MD 03/22/20 [phone]

## 2020-03-21 NOTE — Progress Notes (Signed)
*  PRELIMINARY RESULTS* Echocardiogram 2D Echocardiogram has been performed.  Leavy Cella 03/21/2020, 2:54 PM

## 2020-03-22 ENCOUNTER — Encounter (HOSPITAL_COMMUNITY): Payer: Self-pay | Admitting: Family Medicine

## 2020-03-22 DIAGNOSIS — R7989 Other specified abnormal findings of blood chemistry: Secondary | ICD-10-CM

## 2020-03-22 DIAGNOSIS — J9691 Respiratory failure, unspecified with hypoxia: Secondary | ICD-10-CM | POA: Insufficient documentation

## 2020-03-22 DIAGNOSIS — I2510 Atherosclerotic heart disease of native coronary artery without angina pectoris: Secondary | ICD-10-CM

## 2020-03-22 DIAGNOSIS — J810 Acute pulmonary edema: Secondary | ICD-10-CM

## 2020-03-22 DIAGNOSIS — I10 Essential (primary) hypertension: Secondary | ICD-10-CM

## 2020-03-22 DIAGNOSIS — I5022 Chronic systolic (congestive) heart failure: Secondary | ICD-10-CM

## 2020-03-22 DIAGNOSIS — N179 Acute kidney failure, unspecified: Secondary | ICD-10-CM

## 2020-03-22 DIAGNOSIS — I340 Nonrheumatic mitral (valve) insufficiency: Secondary | ICD-10-CM | POA: Diagnosis not present

## 2020-03-22 DIAGNOSIS — J9601 Acute respiratory failure with hypoxia: Secondary | ICD-10-CM

## 2020-03-22 DIAGNOSIS — D72829 Elevated white blood cell count, unspecified: Secondary | ICD-10-CM

## 2020-03-22 DIAGNOSIS — J441 Chronic obstructive pulmonary disease with (acute) exacerbation: Secondary | ICD-10-CM

## 2020-03-22 LAB — HEPATITIS PANEL, ACUTE
HCV Ab: NONREACTIVE
Hep A IgM: NONREACTIVE
Hep B C IgM: NONREACTIVE
Hepatitis B Surface Ag: NONREACTIVE

## 2020-03-22 LAB — COMPREHENSIVE METABOLIC PANEL
ALT: 365 U/L — ABNORMAL HIGH (ref 0–44)
AST: 519 U/L — ABNORMAL HIGH (ref 15–41)
Albumin: 3.4 g/dL — ABNORMAL LOW (ref 3.5–5.0)
Alkaline Phosphatase: 95 U/L (ref 38–126)
Anion gap: 12 (ref 5–15)
BUN: 42 mg/dL — ABNORMAL HIGH (ref 8–23)
CO2: 18 mmol/L — ABNORMAL LOW (ref 22–32)
Calcium: 8.1 mg/dL — ABNORMAL LOW (ref 8.9–10.3)
Chloride: 102 mmol/L (ref 98–111)
Creatinine, Ser: 2.49 mg/dL — ABNORMAL HIGH (ref 0.44–1.00)
GFR, Estimated: 18 mL/min — ABNORMAL LOW (ref >60)
Glucose, Bld: 125 mg/dL — ABNORMAL HIGH (ref 70–99)
Potassium: 4.8 mmol/L (ref 3.5–5.1)
Sodium: 132 mmol/L — ABNORMAL LOW (ref 135–145)
Total Bilirubin: 0.8 mg/dL (ref 0.3–1.2)
Total Protein: 6.5 g/dL (ref 6.5–8.1)

## 2020-03-22 MED ORDER — IPRATROPIUM-ALBUTEROL 0.5-2.5 (3) MG/3ML IN SOLN
3.0000 mL | Freq: Four times a day (QID) | RESPIRATORY_TRACT | Status: DC
  Administered 2020-03-22 – 2020-03-23 (×7): 3 mL via RESPIRATORY_TRACT
  Filled 2020-03-22 (×7): qty 3

## 2020-03-22 MED ORDER — FUROSEMIDE 10 MG/ML IJ SOLN
40.0000 mg | Freq: Two times a day (BID) | INTRAMUSCULAR | Status: DC
  Administered 2020-03-22 – 2020-03-25 (×6): 40 mg via INTRAVENOUS
  Filled 2020-03-22 (×6): qty 4

## 2020-03-22 MED ORDER — CHLORHEXIDINE GLUCONATE CLOTH 2 % EX PADS
6.0000 | MEDICATED_PAD | Freq: Every day | CUTANEOUS | Status: DC
  Administered 2020-03-22 – 2020-03-27 (×5): 6 via TOPICAL

## 2020-03-22 MED ORDER — NICOTINE 14 MG/24HR TD PT24
14.0000 mg | MEDICATED_PATCH | Freq: Every day | TRANSDERMAL | Status: DC
  Administered 2020-03-22 – 2020-03-28 (×7): 14 mg via TRANSDERMAL
  Filled 2020-03-22 (×7): qty 1

## 2020-03-22 MED ORDER — PREDNISONE 20 MG PO TABS
40.0000 mg | ORAL_TABLET | Freq: Every day | ORAL | Status: AC
  Administered 2020-03-23 – 2020-03-26 (×4): 40 mg via ORAL
  Filled 2020-03-22 (×4): qty 2

## 2020-03-22 MED ORDER — FUROSEMIDE 40 MG PO TABS: 40.0000 mg | ORAL_TABLET | Freq: Two times a day (BID) | ORAL | Status: DC

## 2020-03-22 NOTE — Progress Notes (Signed)
[facility]  PROGRESS NOTE  [name] VOZ:[id number] DOB: [birth year] DOA: 03/21/2020 PCP: Patient, No Pcp Per Admit date - 03/21/2020   Admitting Physician Rolla Plate, DO  Outpatient Primary MD for the patient is Patient, No Pcp Per  LOS - 1 Brief Narrative  [name] is a 74 year old female with medical history significant for chronic systolic CHF with EF of 42% (TTE on 12/2019), HTN, HLD, COPD who presented to the ED on 10/14 with complaints of worsening shortness of breath over several weeks but particular over the last few days in setting of poor adherence to her cardiac medications per her daughter and was found to have acute hypoxic respiratory failure secondary to CHF/COPD exacerbation requiring BiPAP and IV Lasix.  Was also found to have elevated LFTs suspected to be hepatic congestion as well as AKI   Subjective  Today thinks her breathing has improved significantly.  Denies any chest pain or abdominal pain.  Does report decreased appetite  A & P  Acute hypoxic respiratory failure, multifactorial etiology including acute exacerbation of CHF and COPD exacerbation, improving.  Required BiPAP on admission, now has normal oxygen saturation on room air while at rest.  Feels her breathing has significantly improved. -Continue IV Lasix 60 mg twice daily -We will need ambulatory O2 testing -DuoNeb scheduled, prednisone, flutter valve, some spirometry -Cardiology consulted  Atrial Fibrillation? Noted on Telemetry. No known previous diagnosis. TSH wnl. Currently rate controlled. If confirmed CHADsVASC is 4 and she would need anticoagulation -obtain EKG to confirm --may need to start anticoagulation  Acute on chronic systolic/diastolic CHF exacerbation in setting of nonadherence to lasix regimen, improving. Mitral valve regurgitation  EF remained stable at 20% and LV demonstrates global hypokinesis on repeat TTE here as well as grade 2 diastolic  dysfunction.Presented with BNP greater than 4500, pulmonary edema and possible pleural effusion on chest x-ray that required BiPAP and IV Lasix now significantly improved.  No lower extremity swelling, however patient states most of her weight tends to build up in her belly , abdomen does not seem distended on my exam.  Patient does have evidence of what seems to be hepatic congestion given elevated LFTs. Quick improvement -given worsening AKI and significant improvement with IV lasix will transition to oral and monitor output, daily weights, fluid restrict -Await cardiology recommendations   COPD exacerbation Continued tobacco use Noticeable end expiratory wheezing in all lung fields, few shortness of breath is improving, patient is still A half pack-a-day smoker -Tobacco cessation counseling provided, patient currently in contemplative state -Add nicotine patch -Scheduled duo nebs, add prednisone, some spirometer, flutter valve -We will need outpatient pulmonary follow-up, has been arranged in the past  Elevated LFTs, likely hepatic congestion in setting of exacerbation of CHF, worsening. LFTs continue to trend up.  Patient is without abdominal pain -Pending hepatitis panel -Abdominal ultrasound showed possible CBD dilation, patient without any pain pain in abdomen starts will consider MRCP for further evaluation  AKI, continues to worsen.  Suspect this is still prerenal in the setting of CHF exacerbation. Given lower BP and significant improvement in breathing I don't think she needs continued IV lasix diuresis.  Previous baseline creatinin 0.9-1.  On admission creatinine 1.75, now 2.49.  Still making adequate output with no current indication for dialysis -Avoid nephrotoxins -Continue to monitor BMP and output --d/c IV lasix transition to oral  Leukocytosis, suspect stress related to CHF/COPD exacerbation.  Has no localizing symptoms of infection has remained afebrile -Continue to monitor  CBC  Elevated troponin, suspect demand ischemia given no ischemic changes on EKG, no chest pain, likely occurring in setting of CHF/COPD exacerbation -As needed EKG if chest pain  CAD, no chest pain -continue aspirin -has not been taking aspirin     Family Communication  : Daughter updated over phone on 10/14  Code Status : Full code, as discussed on day of admission  Disposition Plan  :  Patient is from home. Anticipated d/c date: 2 to 3 days. Barriers to d/c or necessity for inpatient status:  Need to closely monitor kidney function given persistent AKI, decreasing Lasix to oral regimen and monitoring, need to continue to monitor LFTs, currently requiring scheduled DuoNebs for COPD exacerbation Consults  : Cardiology  Procedures  : TTE, 10/14  DVT Prophylaxis  : Heparin  MDM: The below labs and imaging reports were reviewed and summarized above.  Medication management as above.  Lab Results  Component Value Date   PLT 199 03/21/2020    Diet :  Diet Order            Diet renal/carb modified with fluid restriction Diet-HS Snack? Nothing; Fluid restriction: 1200 mL Fluid; Room service appropriate? Yes; Fluid consistency: Thin  Diet effective now                  Inpatient Medications Scheduled Meds: . aspirin EC  81 mg Oral Daily  . furosemide  60 mg Intravenous BID  . heparin  5,000 Units Subcutaneous Q8H  . ipratropium-albuterol  3 mL Nebulization Q6H  . predniSONE  40 mg Oral Q breakfast  . sodium chloride flush  3 mL Intravenous Q12H   Continuous Infusions: . sodium chloride     PRN Meds:.sodium chloride, acetaminophen, ondansetron (ZOFRAN) IV, sodium chloride flush, traMADol  Antibiotics  :   Anti-infectives (From admission, onward)   None       Objective   Vitals:   03/22/20 0500 03/22/20 0530 03/22/20 0600 03/22/20 0630  BP: 99/75 104/68 (!) 86/75 96/63  Pulse: 88 92 89 88  Resp: 17 18 16 16   Temp: 98.6 F (37 C) 98.6 F (37 C) 98.8 F  (37.1 C) 98.8 F (37.1 C)  SpO2: 95% 93% 96% 95%    SpO2: 95 % O2 Flow Rate (L/min): 2 L/min FiO2 (%): 60 %  Wt Readings from Last 3 Encounters:  12/09/19 62.2 kg  10/10/19 61.8 kg  04/20/14 72.9 kg    No intake or output data in the 24 hours ending 03/22/20 0923  Physical Exam:     Awake Alert, Oriented X 3, Normal affect No new F.N deficits,  [place].AT, Normal respiratory effort on room air, and expiratory wheezing in all lung fields, crackles at bases, no conversational dyspnea Irregularly irregular rhythm, normal rate,No Gallops,Rubs or new Murmurs, no peripheral edema +ve B.Sounds, Abd Soft, No tenderness, No rebound, guarding or rigidity. No Cyanosis, No new Rash or bruise    I have personally reviewed the following:   Data Reviewed:  CBC Recent Labs  Lab 03/21/20 0954 03/21/20 2114  WBC 16.6* 16.0*  HGB 10.5* 9.6*  HCT 36.9 32.1*  PLT 273 199  MCV 85.8 79.3*  MCH 24.4* 23.7*  MCHC 28.5* 29.9*  RDW 21.7* 21.0*  LYMPHSABS 4.7*  --   MONOABS 1.2*  --   EOSABS 0.0  --   BASOSABS 0.1  --     Chemistries  Recent Labs  Lab 03/21/20 0954 03/21/20 1547 03/22/20 0315  NA 134*  --  132*  K 4.5  --  4.8  CL 98  --  102  CO2 12*  --  18*  GLUCOSE 126*  --  125*  BUN 22  --  42*  CREATININE 1.75*  --  2.49*  CALCIUM 8.5*  --  8.1*  MG  --  2.5*  --   AST 258*  --  519*  ALT 144*  --  365*  ALKPHOS 100  --  95  BILITOT 0.9  --  0.8   ------------------------------------------------------------------------------------------------------------------ No results for input(s): CHOL, HDL, LDLCALC, TRIG, CHOLHDL, LDLDIRECT in the last 72 hours.  Lab Results  Component Value Date   HGBA1C 5.5 10/06/2019   ------------------------------------------------------------------------------------------------------------------ Recent Labs    03/21/20 1547  TSH 1.814    ------------------------------------------------------------------------------------------------------------------ No results for input(s): VITAMINB12, FOLATE, FERRITIN, TIBC, IRON, RETICCTPCT in the last 72 hours.  Coagulation profile Recent Labs  Lab 03/21/20 1547  INR 1.4*    No results for input(s): DDIMER in the last 72 hours.  Cardiac Enzymes No results for input(s): CKMB, TROPONINI, MYOGLOBIN in the last 168 hours.  Invalid input(s): CK ------------------------------------------------------------------------------------------------------------------    Component Value Date/Time   BNP >4,500.0 (H) 03/21/2020 2671    Micro Results Recent Results (from the past 240 hour(s))  Respiratory Panel by RT PCR (Flu A&B, Covid) - Nasopharyngeal Swab     Status: None   Collection Time: 03/21/20  9:53 AM   Specimen: Nasopharyngeal Swab  Result Value Ref Range Status   SARS Coronavirus 2 by RT PCR NEGATIVE NEGATIVE Final    Comment: (NOTE) SARS-CoV-2 target nucleic acids are NOT DETECTED.  The SARS-CoV-2 RNA is generally detectable in upper respiratoy specimens during the acute phase of infection. The lowest concentration of SARS-CoV-2 viral copies this assay can detect is 131 copies/mL. A negative result does not preclude SARS-Cov-2 infection and should not be used as the sole basis for treatment or other patient management decisions. A negative result may occur with  improper specimen collection/handling, submission of specimen other than nasopharyngeal swab, presence of viral mutation(s) within the areas targeted by this assay, and inadequate number of viral copies (<131 copies/mL). A negative result must be combined with clinical observations, patient history, and epidemiological information. The expected result is Negative.  Fact Sheet for Patients:  PinkCheek.be  Fact Sheet for Healthcare Providers:   GravelBags.it  This test is no t yet approved or cleared by the [place] FDA and  has been authorized for detection and/or diagnosis of SARS-CoV-2 by FDA under an Emergency Use Authorization (EUA). This EUA will remain  in effect (meaning this test can be used) for the duration of the COVID-19 declaration under Section 564(b)(1) of the Act, 21 U.S.C. section 360bbb-3(b)(1), unless the authorization is terminated or revoked sooner.     Influenza A by PCR NEGATIVE NEGATIVE Final   Influenza B by PCR NEGATIVE NEGATIVE Final    Comment: (NOTE) The Xpert Xpress SARS-CoV-2/FLU/RSV assay is intended as an aid in  the diagnosis of influenza from Nasopharyngeal swab specimens and  should not be used as a sole basis for treatment. Nasal washings and  aspirates are unacceptable for Xpert Xpress SARS-CoV-2/FLU/RSV  testing.  Fact Sheet for Patients: PinkCheek.be  Fact Sheet for Healthcare Providers: GravelBags.it  This test is not yet approved or cleared by the [place] FDA and  has been authorized for detection and/or diagnosis of SARS-CoV-2 by  FDA under an Emergency Use Authorization (EUA). This EUA will remain  in  effect (meaning this test can be used) for the duration of the  Covid-19 declaration under Section 564(b)(1) of the Act, 21  U.S.C. section 360bbb-3(b)(1), unless the authorization is  terminated or revoked. Performed at [facility], [address], [place], [place] [address]     Radiology Reports DG Chest Portable 1 View  Result Date: 03/21/2020 CLINICAL DATA:  Shortness of breath, increased shortness of breath with COPD EXAM: PORTABLE CHEST 1 VIEW COMPARISON:  December 07, 2019 FINDINGS: Trachea midline. Cardiomediastinal contours remain markedly enlarged. Configuration of the heart raising the question of pericardial effusion though not significantly changed from prior study.  Aortic atherosclerosis in the aortic arch. Partially obscured LEFT hemidiaphragm. Increased interstitial markings without lobar consolidation. Limited assessment of skeletal structures without acute process. IMPRESSION: 1. Marked enlargement of the cardiomediastinal contours with findings of pulmonary edema and possible LEFT effusion. 2. Partially obscured LEFT hemidiaphragm may represent atelectasis or developing infiltrate. 3. Cardiac configuration raising the question of pericardial effusion though not significantly changed when compared to the previous exam. Electronically Signed   By: Zetta Bills M.D.   On: 03/21/2020 10:27   ECHOCARDIOGRAM COMPLETE  Result Date: 03/21/2020    ECHOCARDIOGRAM REPORT   Patient Name:   [name] Date of Exam: 03/21/2020 Medical Rec #:  [id number]        Height:       66.5 in Accession #:    [id number]       Weight:       137.1 lb Date of Birth:  01-15-1946        BSA:          1.713 m Patient Age:    48 years         BP:           106/74 mmHg Patient Gender: F                HR:           92 bpm. Exam Location:  [facility] Procedure: Cardiac Doppler, Color Doppler and 2D Echo Indications:    Acute systolic chf. Cxr mentions concern for pericardial                 effusion  History:        Patient has prior history of Echocardiogram examinations, most                 recent 12/27/2019. COPD; Risk Factors:Dyslipidemia and Current                 Smoker. Acute respiratory failure, MITRAL REGURGITATION.  Sonographer:    Leavy Cella RDCS (AE) Referring Phys: [id number] [place]  1. Images are limited.  2. Left ventricular ejection fraction, by estimation, is approximately 20%. The left ventricle has severely decreased function. The left ventricle demonstrates global hypokinesis with some regional variation. Left ventricular diastolic parameters are consistent with Grade II diastolic dysfunction (pseudonormalization).  3. Right ventricular systolic  function is moderately reduced. The right ventricular size is normal. There is moderately elevated pulmonary artery systolic pressure. The estimated right ventricular systolic pressure is 37.1 mmHg.  4. Left atrial size was mild to moderately dilated.  5. Right atrial size was mildly dilated.  6. The mitral valve is abnormal. Moderate to severe mitral valve regurgitation. Moderate mitral annular calcification.  7. The aortic valve is tricuspid. There is mild calcification of the aortic valve. Aortic valve regurgitation is mild.  8. The  inferior vena cava is dilated in size with >50% respiratory variability, suggesting right atrial pressure of 8 mmHg. FINDINGS  Left Ventricle: Left ventricular ejection fraction, by estimation, is 20%. The left ventricle has severely decreased function. The left ventricle demonstrates global hypokinesis. The left ventricular internal cavity size was normal in size. There is no left ventricular hypertrophy. Abnormal (paradoxical) septal motion, consistent with left bundle branch block. Left ventricular diastolic parameters are consistent with Grade II diastolic dysfunction (pseudonormalization). Right Ventricle: The right ventricular size is normal. No increase in right ventricular wall thickness. Right ventricular systolic function is moderately reduced. There is moderately elevated pulmonary artery systolic pressure. The tricuspid regurgitant velocity is 3.27 m/s, and with an assumed right atrial pressure of 8 mmHg, the estimated right ventricular systolic pressure is 26.9 mmHg. Left Atrium: Left atrial size was mild to moderately dilated. Right Atrium: Right atrial size was mildly dilated. Pericardium: There is no evidence of pericardial effusion. Mitral Valve: The mitral valve is abnormal. Moderate mitral annular calcification. Moderate to severe mitral valve regurgitation. Tricuspid Valve: The tricuspid valve is grossly normal. Tricuspid valve regurgitation is mild. Aortic Valve:  The aortic valve is tricuspid. There is mild calcification of the aortic valve. Aortic valve regurgitation is mild. Pulmonic Valve: The pulmonic valve was not well visualized. Pulmonic valve regurgitation is trivial. Aorta: The aortic root is normal in size and structure. Venous: The inferior vena cava is dilated in size with greater than 50% respiratory variability, suggesting right atrial pressure of 8 mmHg. IAS/Shunts: No atrial level shunt detected by color flow Doppler.  LEFT VENTRICLE PLAX 2D LVIDd:         5.07 cm  Diastology LVIDs:         4.40 cm  LV e' medial:    4.68 cm/s LV PW:         1.25 cm  LV E/e' medial:  20.8 LV IVS:        1.31 cm  LV e' lateral:   7.88 cm/s LVOT diam:     1.90 cm  LV E/e' lateral: 12.3 LVOT Area:     2.84 cm  RIGHT VENTRICLE RV S prime:     11.50 cm/s LEFT ATRIUM             Index       RIGHT ATRIUM           Index LA diam:        3.70 cm 2.16 cm/m  RA Area:     21.00 cm LA Vol (A2C):   96.8 ml 56.51 ml/m RA Volume:   64.00 ml  37.36 ml/m LA Vol (A4C):   47.1 ml 27.50 ml/m LA Biplane Vol: 70.0 ml 40.87 ml/m   AORTA Ao Root diam: 2.70 cm MITRAL VALVE                 TRICUSPID VALVE MV Area (PHT): 5.34 cm      TR Peak grad:   42.8 mmHg MV Decel Time: 142 msec      TR Vmax:        327.00 cm/s MR Peak grad:    101.6 mmHg MR Mean grad:    60.0 mmHg   SHUNTS MR Vmax:         504.00 cm/s Systemic Diam: 1.90 cm MR Vmean:        357.0 cm/s MR PISA:         3.08 cm MR PISA Eff ROA: 19 mm MR PISA  Radius:  0.70 cm MV E velocity: 97.30 cm/s MV A velocity: 52.30 cm/s MV E/A ratio:  1.86 Rozann Lesches MD Electronically signed by Rozann Lesches MD Signature Date/Time: 03/21/2020/4:39:46 PM    Final    US Abdomen Limited RUQ  Result Date: 03/21/2020 CLINICAL DATA:  Increased LFTs. EXAM: ULTRASOUND ABDOMEN LIMITED RIGHT UPPER QUADRANT COMPARISON:  None. FINDINGS: Gallbladder: Status post cholecystectomy. Common bile duct: Diameter: 11 mm, which is dilated. Liver: No focal lesion  identified, although evaluation is limited secondary to poor sonographic window. Mildly increased echogenicity of the liver diffusely. Portal vein is patent on color Doppler imaging with normal direction of blood flow towards the liver. Other: Right pleural effusion. IMPRESSION: 1. The proximal common bile duct is dilated, measuring 11 mm. No evidence of proximal obstructing lesion; however, the distal common bile duct is not well evaluated sonographically. If there is concern for obstruction, MRI/MRCP or CT could further evaluate. 2. Mildly increased echogenicity of the liver diffusely, which may relate to hepatic steatosis or chronic hepatic disease. 3. Right pleural effusion. 4. Status post cholecystectomy. Electronically Signed   By: Margaretha Sheffield MD   On: 03/21/2020 13:27     Time Spent in minutes  30     Desiree Hane M.D on 03/22/2020 at 8:12 AM  To page go to [email or web address] - password [facility]

## 2020-03-22 NOTE — ED Notes (Signed)
ED TO INPATIENT HANDOFF REPORT  ED Nurse Name and Phone #: [phone]  S Name/Age/Gender [name] 74 y.o. female Room/Bed: APA02/APA02  Code Status   Code Status: Full Code  Home/SNF/Other Home Patient oriented to: self, place, time and situation Is this baseline? Yes   Triage Complete: Triage complete  Chief Complaint Acute respiratory failure with hypoxia ([place]) [J96.01] Respiratory failure with hypoxia [facility]) [J96.91]  Triage Note [name] called EMS for sob.   Pt found to be sob, HX of HF. EMS placed pt on CPAP with good response. Pts sat low 90's    Allergies Allergies  Allergen Reactions  . Gabapentin Swelling    Level of Care/Admitting Diagnosis ED Disposition    ED Disposition Condition [facility]: [facility] [[id number]]  Level of Care: Telemetry [5]  Covid Evaluation: Confirmed COVID Negative  Diagnosis: Respiratory failure with hypoxia [facility]) [[id number]]  Admitting Physician: Rolla Plate [[id number]]  Attending Physician: Rolla Plate [[id number]]  Estimated length of stay: past midnight tomorrow  Certification:: I certify this patient will need inpatient services for at least 2 midnights       B Medical/Surgery History Past Medical History:  Diagnosis Date  . Breast cancer ([place])   . Carotid artery stenosis   . CHF (congestive heart failure) (H[facility])    a. EF 20% by echocardiogram in 09/2019  . COPD (chronic obstructive pulmonary disease) ([place])   . Coronary artery disease    a. s/p BMS to RCA in 2010 b. cath in 10/2019 showing mild to moderate disease and patent RCA stent with minimal ISR with reduction in LV function out of proportion to the degree of CAD)  . Depression   . Enlargement of lymph node   . GERD (gastroesophageal reflux disease)   . Hyperlipidemia   . Hypertension   . LBP (low back pain)   . Mitral regurgitation   . Osteoarthritis   . Sciatica   . Systolic congestive heart failure  ([place])   . Tobacco user   . Urge urinary incontinence    Past Surgical History:  Procedure Laterality Date  . APPENDECTOMY     as a child  . BACK SURGERY  1995  . bare-metal stenting of critical mid ICA stenosis     w/ ulcerated plaque/thrombus  . CAROTID ENDARTERECTOMY    . [place]  . CHOLECYSTECTOMY  1980s  . MASTECTOMY  1992   L  . RIGHT/LEFT HEART CATH AND CORONARY ANGIOGRAPHY N/A 10/09/2019   Procedure: RIGHT/LEFT HEART CATH AND CORONARY ANGIOGRAPHY;  Surgeon: Nelva Bush, MD;  Location: [place] [facility];  Service: Cardiovascular;  Laterality: N/A;     A IV Location/Drains/Wounds Patient Lines/Drains/Airways Status    Active Line/Drains/Airways    Name Placement date Placement time Site Days   Peripheral IV 03/21/20 Left Antecubital 03/21/20  0946  Antecubital  1   Peripheral IV 03/21/20 Right Antecubital 03/21/20  0947  Antecubital  1   Urethral Catheter Temperature probe 03/22/20  0237  Temperature probe  less than 1   Incision (Closed) 12/05/19 Breast Left 12/05/19  1122   108          Intake/Output Last 24 hours No intake or output data in the 24 hours ending 03/22/20 1855  Labs/Imaging Results for orders placed or performed during the hospital encounter of 03/21/20 (from the past 48 hour(s))  CBG monitoring, ED     Status: Abnormal  Collection Time: 03/21/20  9:46 AM  Result Value Ref Range   Glucose-Capillary 114 (H) 70 - 99 mg/dL    Comment: Glucose reference range applies only to samples taken after fasting for at least 8 hours.  Respiratory Panel by RT PCR (Flu A&B, Covid) - Nasopharyngeal Swab     Status: None   Collection Time: 03/21/20  9:53 AM   Specimen: Nasopharyngeal Swab  Result Value Ref Range   SARS Coronavirus 2 by RT PCR NEGATIVE NEGATIVE    Comment: (NOTE) SARS-CoV-2 target nucleic acids are NOT DETECTED.  The SARS-CoV-2 RNA is generally detectable in upper respiratoy specimens during the acute phase of  infection. The lowest concentration of SARS-CoV-2 viral copies this assay can detect is 131 copies/mL. A negative result does not preclude SARS-Cov-2 infection and should not be used as the sole basis for treatment or other patient management decisions. A negative result may occur with  improper specimen collection/handling, submission of specimen other than nasopharyngeal swab, presence of viral mutation(s) within the areas targeted by this assay, and inadequate number of viral copies (<131 copies/mL). A negative result must be combined with clinical observations, patient history, and epidemiological information. The expected result is Negative.  Fact Sheet for Patients:  PinkCheek.be  Fact Sheet for Healthcare Providers:  GravelBags.it  This test is no t yet approved or cleared by the [place] FDA and  has been authorized for detection and/or diagnosis of SARS-CoV-2 by FDA under an Emergency Use Authorization (EUA). This EUA will remain  in effect (meaning this test can be used) for the duration of the COVID-19 declaration under Section 564(b)(1) of the Act, 21 U.S.C. section 360bbb-3(b)(1), unless the authorization is terminated or revoked sooner.     Influenza A by PCR NEGATIVE NEGATIVE   Influenza B by PCR NEGATIVE NEGATIVE    Comment: (NOTE) The Xpert Xpress SARS-CoV-2/FLU/RSV assay is intended as an aid in  the diagnosis of influenza from Nasopharyngeal swab specimens and  should not be used as a sole basis for treatment. Nasal washings and  aspirates are unacceptable for Xpert Xpress SARS-CoV-2/FLU/RSV  testing.  Fact Sheet for Patients: PinkCheek.be  Fact Sheet for Healthcare Providers: GravelBags.it  This test is not yet approved or cleared by the [place] FDA and  has been authorized for detection and/or diagnosis of SARS-CoV-2 by  FDA  under an Emergency Use Authorization (EUA). This EUA will remain  in effect (meaning this test can be used) for the duration of the  Covid-19 declaration under Section 564(b)(1) of the Act, 21  U.S.C. section 360bbb-3(b)(1), unless the authorization is  terminated or revoked. Performed at [facility], [address] [place], [place] [address]   Comprehensive metabolic panel     Status: Abnormal   Collection Time: 03/21/20  9:54 AM  Result Value Ref Range   Sodium 134 (L) 135 - 145 mmol/L   Potassium 4.5 3.5 - 5.1 mmol/L   Chloride 98 98 - 111 mmol/L   CO2 12 (L) 22 - 32 mmol/L   Glucose, Bld 126 (H) 70 - 99 mg/dL    Comment: Glucose reference range applies only to samples taken after fasting for at least 8 hours.   BUN 22 8 - 23 mg/dL   Creatinine, Ser 1.75 (H) 0.44 - 1.00 mg/dL   Calcium 8.5 (L) 8.9 - 10.3 mg/dL   Total Protein 7.0 6.5 - 8.1 g/dL   Albumin 3.6 3.5 - 5.0 g/dL   AST 258 (H) 15 -  41 U/L    Comment: RESULTS CONFIRMED BY MANUAL DILUTION   ALT 144 (H) 0 - 44 U/L    Comment: RESULTS CONFIRMED BY MANUAL DILUTION   Alkaline Phosphatase 100 38 - 126 U/L   Total Bilirubin 0.9 0.3 - 1.2 mg/dL   GFR, Estimated 28 (L) >60 mL/min    Comment: Performed at [facility], [address], [place], [place] [address]  Brain natriuretic peptide     Status: Abnormal   Collection Time: 03/21/20  9:54 AM  Result Value Ref Range   B Natriuretic Peptide >4,500.0 (H) 0.0 - 100.0 pg/mL    Comment: Performed at [facility], [address] [place], [place] [address]  Troponin I (High Sensitivity)     Status: Abnormal   Collection Time: 03/21/20  9:54 AM  Result Value Ref Range   Troponin I (High Sensitivity) 81 (H) <18 ng/L    Comment: (NOTE) Elevated high sensitivity troponin I (hsTnI) values and significant  changes across serial measurements may suggest ACS but many other  chronic and acute conditions are known to elevate hsTnI results.  Refer to the Links section for chest pain  algorithms and additional  guidance. Performed at [facility], [address], [place], [place] [address]   CBC with Differential     Status: Abnormal   Collection Time: 03/21/20  9:54 AM  Result Value Ref Range   WBC 16.6 (H) 4.0 - 10.5 K/uL   RBC 4.30 3.87 - 5.11 MIL/uL   Hemoglobin 10.5 (L) 12.0 - 15.0 g/dL   HCT 36.9 36 - 46 %   MCV 85.8 80.0 - 100.0 fL   MCH 24.4 (L) 26.0 - 34.0 pg   MCHC 28.5 (L) 30.0 - 36.0 g/dL   RDW 21.7 (H) 11.5 - 15.5 %   Platelets 273 150 - 400 K/uL   nRBC 0.4 (H) 0.0 - 0.2 %   Neutrophils Relative % 62 %   Neutro Abs 10.3 (H) 1.7 - 7.7 K/uL   Lymphocytes Relative 28 %   Lymphs Abs 4.7 (H) 0.7 - 4.0 K/uL   Monocytes Relative 7 %   Monocytes Absolute 1.2 (H) 0.1 - 1.0 K/uL   Eosinophils Relative 0 %   Eosinophils Absolute 0.0 0.0 - 0.5 K/uL   Basophils Relative 1 %   Basophils Absolute 0.1 0.0 - 0.1 K/uL   Immature Granulocytes 2 %   Abs Immature Granulocytes 0.36 (H) 0.00 - 0.07 K/uL   Polychromasia PRESENT     Comment: Performed at [facility], [address] [place], [place] [address]  Troponin I (High Sensitivity)     Status: Abnormal   Collection Time: 03/21/20 11:49 AM  Result Value Ref Range   Troponin I (High Sensitivity) 90 (H) <18 ng/L    Comment: (NOTE) Elevated high sensitivity troponin I (hsTnI) values and significant  changes across serial measurements may suggest ACS but many other  chronic and acute conditions are known to elevate hsTnI results.  Refer to the "Links" section for chest pain algorithms and additional  guidance. Performed at [facility], [address], [place], [place] [address]   TSH     Status: None   Collection Time: 03/21/20  3:47 PM  Result Value Ref Range   TSH 1.814 0.350 - 4.500 uIU/mL    Comment: Performed by a 3rd Generation assay with a functional sensitivity of <=0.01 uIU/mL. Performed at [facility], [address] [place], [place] [address]   [place]  Status: Abnormal   Collection  Time: 03/21/20  3:47 PM  Result Value Ref Range   Prothrombin Time 16.7 (H) 11.4 - 15.2 seconds   INR 1.4 (H) 0.8 - 1.2    Comment: (NOTE) INR goal varies based on device and disease states. Performed at [facility], [address], [place], [place] [address]   APTT     Status: None   Collection Time: 03/21/20  3:47 PM  Result Value Ref Range   aPTT 30 24 - 36 seconds    Comment: Performed at [facility], [address], [place], [place] [address]  Magnesium     Status: Abnormal   Collection Time: 03/21/20  3:47 PM  Result Value Ref Range   Magnesium 2.5 (H) 1.7 - 2.4 mg/dL    Comment: Performed at [facility], [address], [place], [place] [address]  Hepatitis panel, acute     Status: None   Collection Time: 03/21/20  9:14 PM  Result Value Ref Range   Hepatitis B Surface Ag NON REACTIVE NON REACTIVE   HCV Ab NON REACTIVE NON REACTIVE    Comment: (NOTE) Nonreactive HCV antibody screen is consistent with no HCV infections,  unless recent infection is suspected or other evidence exists to indicate HCV infection.     Hep A IgM NON REACTIVE NON REACTIVE   Hep B C IgM NON REACTIVE NON REACTIVE    Comment: Performed at [facility], [place] [address] [place], [place] [address]  CBC     Status: Abnormal   Collection Time: 03/21/20  9:14 PM  Result Value Ref Range   WBC 16.0 (H) 4.0 - 10.5 K/uL   RBC 4.05 3.87 - 5.11 MIL/uL   Hemoglobin 9.6 (L) 12.0 - 15.0 g/dL   HCT 32.1 (L) 36 - 46 %   MCV 79.3 (L) 80.0 - 100.0 fL    Comment: DELTA CHECK NOTED   MCH 23.7 (L) 26.0 - 34.0 pg   MCHC 29.9 (L) 30.0 - 36.0 g/dL   RDW 21.0 (H) 11.5 - 15.5 %   Platelets 199 150 - 400 K/uL   nRBC 0.2 0.0 - 0.2 %    Comment: Performed at [facility], [address], [place], [place] [address]  Comprehensive metabolic panel     Status: Abnormal   Collection Time: 03/22/20  3:15 AM  Result Value Ref Range   Sodium 132 (L) 135 - 145 mmol/L   Potassium 4.8 3.5 - 5.1 mmol/L   Chloride 102 98 -  111 mmol/L   CO2 18 (L) 22 - 32 mmol/L   Glucose, Bld 125 (H) 70 - 99 mg/dL    Comment: Glucose reference range applies only to samples taken after fasting for at least 8 hours.   BUN 42 (H) 8 - 23 mg/dL   Creatinine, Ser 2.49 (H) 0.44 - 1.00 mg/dL   Calcium 8.1 (L) 8.9 - 10.3 mg/dL   Total Protein 6.5 6.5 - 8.1 g/dL   Albumin 3.4 (L) 3.5 - 5.0 g/dL   AST 519 (H) 15 - 41 U/L   ALT 365 (H) 0 - 44 U/L   Alkaline Phosphatase 95 38 - 126 U/L   Total Bilirubin 0.8 0.3 - 1.2 mg/dL   GFR, Estimated 18 (L) >60 mL/min   Anion gap 12 5 - 15    Comment: Performed at [facility], [address] [place], [place] [address]   DG Chest Portable 1 View  Result Date: 03/21/2020 CLINICAL DATA:  Shortness of breath, increased shortness of breath with COPD EXAM: PORTABLE CHEST 1 VIEW COMPARISON:  December 07, 2019 FINDINGS: Trachea midline. Cardiomediastinal contours remain markedly enlarged. Configuration of the heart raising the question of pericardial effusion though not significantly changed from prior study. Aortic atherosclerosis in the aortic arch. Partially obscured LEFT hemidiaphragm. Increased interstitial markings without lobar consolidation. Limited assessment of skeletal structures without acute process. IMPRESSION: 1. Marked enlargement of the cardiomediastinal contours with findings of pulmonary edema and possible LEFT effusion. 2. Partially obscured LEFT hemidiaphragm may represent atelectasis or developing infiltrate. 3. Cardiac configuration raising the question of pericardial effusion though not significantly changed when compared to the previous exam. Electronically Signed   By: Zetta Bills M.D.   On: 03/21/2020 10:27   ECHOCARDIOGRAM COMPLETE  Result Date: 03/21/2020    ECHOCARDIOGRAM REPORT   Patient Name:   TYMESHA DITMORE Date of Exam: 03/21/2020 Medical Rec #:  [id number]        Height:       66.5 in Accession #:    [id number]       Weight:       137.1 lb Date of Birth:  [birth year]         BSA:          1.713 m Patient Age:    67 years         BP:           106/74 mmHg Patient Gender: F                HR:           92 bpm. Exam Location:  [facility] Procedure: Cardiac Doppler, Color Doppler and 2D Echo Indications:    Acute systolic chf. Cxr mentions concern for pericardial                 effusion  History:        Patient has prior history of Echocardiogram examinations, most                 recent 12/27/2019. COPD; Risk Factors:Dyslipidemia and Current                 Smoker. Acute respiratory failure, MITRAL REGURGITATION.  Sonographer:    Leavy Cella RDCS (AE) Referring Phys: [id number] [place]  1. Images are limited.  2. Left ventricular ejection fraction, by estimation, is approximately 20%. The left ventricle has severely decreased function. The left ventricle demonstrates global hypokinesis with some regional variation. Left ventricular diastolic parameters are consistent with Grade II diastolic dysfunction (pseudonormalization).  3. Right ventricular systolic function is moderately reduced. The right ventricular size is normal. There is moderately elevated pulmonary artery systolic pressure. The estimated right ventricular systolic pressure is 96.2 mmHg.  4. Left atrial size was mild to moderately dilated.  5. Right atrial size was mildly dilated.  6. The mitral valve is abnormal. Moderate to severe mitral valve regurgitation. Moderate mitral annular calcification.  7. The aortic valve is tricuspid. There is mild calcification of the aortic valve. Aortic valve regurgitation is mild.  8. The inferior vena cava is dilated in size with >50% respiratory variability, suggesting right atrial pressure of 8 mmHg. FINDINGS  Left Ventricle: Left ventricular ejection fraction, by estimation, is 20%. The left ventricle has severely decreased function. The left ventricle demonstrates global hypokinesis. The left ventricular internal cavity size was normal in size. There is no left  ventricular hypertrophy. Abnormal (paradoxical) septal motion,  consistent with left bundle branch block. Left ventricular diastolic parameters are consistent with Grade II diastolic dysfunction (pseudonormalization). Right Ventricle: The right ventricular size is normal. No increase in right ventricular wall thickness. Right ventricular systolic function is moderately reduced. There is moderately elevated pulmonary artery systolic pressure. The tricuspid regurgitant velocity is 3.27 m/s, and with an assumed right atrial pressure of 8 mmHg, the estimated right ventricular systolic pressure is 26.9 mmHg. Left Atrium: Left atrial size was mild to moderately dilated. Right Atrium: Right atrial size was mildly dilated. Pericardium: There is no evidence of pericardial effusion. Mitral Valve: The mitral valve is abnormal. Moderate mitral annular calcification. Moderate to severe mitral valve regurgitation. Tricuspid Valve: The tricuspid valve is grossly normal. Tricuspid valve regurgitation is mild. Aortic Valve: The aortic valve is tricuspid. There is mild calcification of the aortic valve. Aortic valve regurgitation is mild. Pulmonic Valve: The pulmonic valve was not well visualized. Pulmonic valve regurgitation is trivial. Aorta: The aortic root is normal in size and structure. Venous: The inferior vena cava is dilated in size with greater than 50% respiratory variability, suggesting right atrial pressure of 8 mmHg. IAS/Shunts: No atrial level shunt detected by color flow Doppler.  LEFT VENTRICLE PLAX 2D LVIDd:         5.07 cm  Diastology LVIDs:         4.40 cm  LV e' medial:    4.68 cm/s LV PW:         1.25 cm  LV E/e' medial:  20.8 LV IVS:        1.31 cm  LV e' lateral:   7.88 cm/s LVOT diam:     1.90 cm  LV E/e' lateral: 12.3 LVOT Area:     2.84 cm  RIGHT VENTRICLE RV S prime:     11.50 cm/s LEFT ATRIUM             Index       RIGHT ATRIUM           Index LA diam:        3.70 cm 2.16 cm/m  RA Area:     21.00 cm LA  Vol (A2C):   96.8 ml 56.51 ml/m RA Volume:   64.00 ml  37.36 ml/m LA Vol (A4C):   47.1 ml 27.50 ml/m LA Biplane Vol: 70.0 ml 40.87 ml/m   AORTA Ao Root diam: 2.70 cm MITRAL VALVE                 TRICUSPID VALVE MV Area (PHT): 5.34 cm      TR Peak grad:   42.8 mmHg MV Decel Time: 142 msec      TR Vmax:        327.00 cm/s MR Peak grad:    101.6 mmHg MR Mean grad:    60.0 mmHg   SHUNTS MR Vmax:         504.00 cm/s Systemic Diam: 1.90 cm MR Vmean:        357.0 cm/s MR PISA:         3.08 cm MR PISA Eff ROA: 19 mm MR PISA Radius:  0.70 cm MV E velocity: 97.30 cm/s MV A velocity: 52.30 cm/s MV E/A ratio:  1.86 Rozann Lesches MD Electronically signed by Rozann Lesches MD Signature Date/Time: 03/21/2020/4:39:46 PM    Final    US Abdomen Limited RUQ  Result Date: 03/21/2020 CLINICAL DATA:  Increased LFTs. EXAM: ULTRASOUND ABDOMEN LIMITED RIGHT UPPER QUADRANT COMPARISON:  None. FINDINGS:  Gallbladder: Status post cholecystectomy. Common bile duct: Diameter: 11 mm, which is dilated. Liver: No focal lesion identified, although evaluation is limited secondary to poor sonographic window. Mildly increased echogenicity of the liver diffusely. Portal vein is patent on color Doppler imaging with normal direction of blood flow towards the liver. Other: Right pleural effusion. IMPRESSION: 1. The proximal common bile duct is dilated, measuring 11 mm. No evidence of proximal obstructing lesion; however, the distal common bile duct is not well evaluated sonographically. If there is concern for obstruction, MRI/MRCP or CT could further evaluate. 2. Mildly increased echogenicity of the liver diffusely, which may relate to hepatic steatosis or chronic hepatic disease. 3. Right pleural effusion. 4. Status post cholecystectomy. Electronically Signed   By: Margaretha Sheffield MD   On: 03/21/2020 13:27    Pending Labs Unresulted Labs (From admission, onward)          Start     Ordered   03/23/20 0500  CBC with  Differential/Platelet  Daily,   R      03/22/20 1055   03/22/20 0500  Comprehensive metabolic panel  Daily,   R      03/21/20 2100          Vitals/Pain Today's Vitals   03/22/20 1700 03/22/20 1730 03/22/20 1800 03/22/20 1830  BP: 103/63 99/61 94/66  103/63  Pulse: 83 89 94 88  Resp: 20 16 20 17   Temp: 98.2 F (36.8 C) 98.1 F (36.7 C) 98.4 F (36.9 C) 98.2 F (36.8 C)  TempSrc:      SpO2: 97% 95% 96% 96%  PainSc:        Isolation Precautions No active isolations  Medications Medications  aspirin EC tablet 81 mg (81 mg Oral Given 03/22/20 1248)  traMADol (ULTRAM) tablet 50 mg (50 mg Oral Given 03/22/20 1250)  sodium chloride flush (NS) 0.9 % injection 3 mL (3 mLs Intravenous Not Given 03/22/20 1248)  sodium chloride flush (NS) 0.9 % injection 3 mL (has no administration in time range)  0.9 %  sodium chloride infusion (has no administration in time range)  acetaminophen (TYLENOL) tablet 650 mg (650 mg Oral Given 03/21/20 1558)  ondansetron (ZOFRAN) injection 4 mg (has no administration in time range)  heparin injection 5,000 Units (5,000 Units Subcutaneous Given 03/22/20 1532)  ipratropium-albuterol (DUONEB) 0.5-2.5 (3) MG/3ML nebulizer solution 3 mL (3 mLs Nebulization Given 03/22/20 1352)  predniSONE (DELTASONE) tablet 40 mg (40 mg Oral Not Given 03/22/20 1246)  nicotine (NICODERM CQ - dosed in mg/24 hours) patch 14 mg (14 mg Transdermal Patch Applied 03/22/20 1248)  furosemide (LASIX) injection 40 mg (40 mg Intravenous Given 03/22/20 1749)  albuterol (VENTOLIN HFA) 108 (90 Base) MCG/ACT inhaler 6 puff (6 puffs Inhalation Given 03/21/20 0956)  furosemide (LASIX) injection 60 mg (60 mg Intramuscular Given 03/21/20 0958)    Mobility walks Low fall risk   Focused Assessments    R Recommendations: See Admitting Provider Note  Report given to:   Additional Notes:

## 2020-03-22 NOTE — Plan of Care (Signed)
  Problem: Acute Rehab PT Goals(only PT should resolve) Goal: Pt Will Ambulate Outcome: Progressing Flowsheets (Taken 03/22/2020 1342) Pt will Ambulate:  50 feet  with modified independence Goal: Pt/caregiver will Perform Home Exercise Program Outcome: Progressing Flowsheets (Taken 03/22/2020 1342) Pt/caregiver will Perform Home Exercise Program:  For increased strengthening  For improved balance  Independently   1:43 PM, 03/22/20 Mearl Latin PT, DPT Physical Therapist at [facility]

## 2020-03-22 NOTE — Consult Note (Addendum)
Cardiology Consult    Patient ID: BEAUTIFUL PENSYL; [id number]; 1946-05-26   Admit date: 03/21/2020 Date of Consult: 03/22/2020  Primary Care Provider: Patient, No Pcp Per Primary Cardiologist: Carlyle Dolly, MD   Patient Profile    PRITI CONSOLI is a 74 y.o. female with past medical history of chronic systolic CHF/NICM (EF 54% by echocardiogram in 09/2019), CAD (s/p BMS to RCA in 2010, cath in 10/2019 showing mild to moderate disease and patent RCA stent with minimal ISR with reduction in LV function out of proportion to the degree of CAD), HTN, HLD, carotid artery stenosis (s/p L CEA in 2010), moderate to severe MR, COPD and history of breast cancer  who is being seen today for the evaluation of CHF at the request of Dr. Lonny Prude.   History of Present Illness    Ms. Minkoff was admitted to [facility] in 11/2019 for an acute CHF exacerbation and reported not taking her medications regularly due to cost (previously on Coreg, Lasix, Entresto, and Spironolactone). Weight had declined to 139 lbs at the time of discharge and she was placed on Lasix 40mg  BID, Toprol-XL 25mg  daily and Spironolactone 12.5mg  daily. Was not placed on an ACE-I or ARB due to soft BP. She has a follow-up visit in 01/2020 but no-showed.   She presented back to the ED on 03/21/2020 for evaluation of worsening dyspnea. Was initially in respiratory distress and required BiPAP. She reports progressive dyspnea at rest and with activity over the past 1.5 weeks. Also reports a productive cough and lower extremity edema. She has not weighed in several weeks but says this was at 136 lbs on most recent check. She denies any recent chest pain or palpitations. Says she has been without several of her heart medications for 2 weeks, including Lasix. Denies any recent chest pain or palpitations.   Initial labs showed WBC 16.6, Hgb 10.5, platelets 273, Na+ 134, K+ 4.5 and creatinine 1.75 (baseline 1.0 - 1.2). AST 258 and ALT 144. BNP  > 4500. Initial HS Troponin 81 with repeat of 90. TSH 1.814. Mg 2.5. CXR showed pulmonary edema and possible left effusion and concern for pericardial effusion. Abdominal US showed dilation of the bile duct with no evidence of obstruction. EKG showed sinus tachycardia, HR 106 with known LBBB.   Was started on IV Lasix 60mg  BID and unfortunately I&O's nor weight have been recorded. Creatinine has worsened to 2.49 and LFT's further elevated to 519 and 365. IV diuretics stopped by the admitting team. Repeat echo shows her EF remains reduced at 20% with Grade 2 DD and moderately reduced RV function. Noted to have moderate to severe MR and mild AI. She is now on RA with appropriate oxygen saturations.   Past Medical History:  Diagnosis Date  . Breast cancer ([place])   . Carotid artery stenosis   . CHF (congestive heart failure) (H[facility])    a. EF 20% by echocardiogram in 09/2019  . COPD (chronic obstructive pulmonary disease) ([place])   . Coronary artery disease    a. s/p BMS to RCA in 2010 b. cath in 10/2019 showing mild to moderate disease and patent RCA stent with minimal ISR with reduction in LV function out of proportion to the degree of CAD)  . Depression   . Enlargement of lymph node   . GERD (gastroesophageal reflux disease)   . Hyperlipidemia   . Hypertension   . LBP (low back pain)   . Mitral regurgitation   .  Osteoarthritis   . Sciatica   . Systolic congestive heart failure ([place])   . Tobacco user   . Urge urinary incontinence     Past Surgical History:  Procedure Laterality Date  . APPENDECTOMY     as a child  . BACK SURGERY  1995  . bare-metal stenting of critical mid ICA stenosis     w/ ulcerated plaque/thrombus  . CAROTID ENDARTERECTOMY    . [place]  . CHOLECYSTECTOMY  1980s  . MASTECTOMY  1992   L  . RIGHT/LEFT HEART CATH AND CORONARY ANGIOGRAPHY N/A 10/09/2019   Procedure: RIGHT/LEFT HEART CATH AND CORONARY ANGIOGRAPHY;  Surgeon: Nelva Bush, MD;   Location: [facility];  Service: Cardiovascular;  Laterality: N/A;     Home Medications:  Prior to Admission medications   Medication Sig Start Date End Date Taking? Authorizing Provider  albuterol (VENTOLIN HFA) 108 (90 Base) MCG/ACT inhaler Inhale 2 puffs into the lungs every 6 (six) hours as needed for wheezing or shortness of breath. 05/01/19 04/30/20 Yes [provider]  aspirin 81 MG tablet Take 81 mg by mouth daily.   Yes [provider]  furosemide (LASIX) 40 MG tablet Take 1 tablet (40 mg total) by mouth 2 (two) times daily. 12/09/19 03/21/20 Yes Shah, Pratik D, DO  metoprolol succinate (TOPROL-XL) 25 MG 24 hr tablet Take 1 tablet (25 mg total) by mouth daily. 12/10/19 03/21/20 Yes Shah, Pratik D, DO  potassium chloride (KLOR-CON) 10 MEQ tablet Take 1 tablet (10 mEq total) by mouth 2 (two) times daily. 12/09/19 03/21/20 Yes Shah, Pratik D, DO  simvastatin (ZOCOR) 80 MG tablet Take 80 mg by mouth at bedtime.     Yes [provider]  spironolactone (ALDACTONE) 25 MG tablet Take 0.5 tablets (12.5 mg total) by mouth daily. 10/11/19 03/21/20 Yes Ghimire, Dante Gang, MD  traMADol (ULTRAM) 50 MG tablet Take 50 mg by mouth every 6 (six) hours as needed. 09/07/19  Yes [provider]  clopidogrel (PLAVIX) 75 MG tablet Take 75 mg by mouth daily.   Patient not taking: Reported on 03/21/2020    [provider]  traZODone (DESYREL) 100 MG tablet Take 100 mg by mouth at bedtime as needed. Patient not taking: Reported on 12/05/2019 05/01/19   [provider]  buPROPion (WELLBUTRIN XL) 150 MG 24 hr tablet Take 1 tablet (150 mg total) by mouth daily. 01/26/11 08/28/11  Josue Hector, MD  fluticasone (FLONASE) 50 MCG/ACT nasal spray Place 2 sprays into the nose daily.    08/28/11  [provider]    Inpatient Medications: Scheduled Meds: . aspirin EC  81 mg Oral Daily  . furosemide  40 mg Intravenous BID  . heparin  5,000 Units Subcutaneous Q8H    . ipratropium-albuterol  3 mL Nebulization Q6H  . nicotine  14 mg Transdermal Daily  . predniSONE  40 mg Oral Q breakfast  . sodium chloride flush  3 mL Intravenous Q12H   Continuous Infusions: . sodium chloride     PRN Meds: sodium chloride, acetaminophen, ondansetron (ZOFRAN) IV, sodium chloride flush, traMADol  Allergies:    Allergies  Allergen Reactions  . Gabapentin Swelling    Social History:   Social History   Socioeconomic History  . Marital status: Married    Spouse name: [name]  . Number of children: Y  . Years of education: Not on file  . Highest education level: Not on file  Occupational History  . 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  Tobacco Use  . Smoking status: Current Every Day Smoker    Packs/day: 1.00    Years: 51.00    Pack years: 51.00    Types: Cigarettes  . Smokeless tobacco: Never Used  Substance and Sexual Activity  . Alcohol use: No  . Drug use: No  . Sexual activity: Not on file  Other Topics Concern  . Not on file  Social History Narrative   Lives with daughter, [name] and [name] and her husband, [name].   Social Determinants of Health   Financial Resource Strain:   . Difficulty of Paying Living Expenses: Not on file  Food Insecurity:   . Worried About Charity fundraiser in the Last Year: Not on file  . Ran Out of Food in the Last Year: Not on file  Transportation Needs:   . Lack of Transportation (Medical): Not on file  . Lack of Transportation (Non-Medical): Not on file  Physical Activity:   . Days of Exercise per Week: Not on file  . Minutes of Exercise per Session: Not on file  Stress:   . Feeling of Stress : Not on file  Social Connections:   . Frequency of Communication with Friends and Family: Not on file  . Frequency of Social Gatherings with Friends and Family: Not on file  . Attends Religious Services: Not on file  . Active  Member of Clubs or Organizations: Not on file  . Attends Archivist Meetings: Not on file  . Marital Status: Not on file  Intimate Partner Violence:   . Fear of Current or Ex-Partner: Not on file  . Emotionally Abused: Not on file  . Physically Abused: Not on file  . Sexually Abused: Not on file     Family History:    Family History  Problem Relation Age of Onset  . Diabetes Mother   . Heart attack Mother        deceased at age 40  . Hypertension Mother   . Lung cancer Father        deceased at age 55  . Stroke Father   . Stroke Brother        deceased at age 26s  . Heart disease Brother       Review of Systems    General:  No chills, fever, night sweats or weight changes.  Cardiovascular:  No chest pain, palpitations, paroxysmal nocturnal dyspnea. Positive for dyspnea on exertion, edema and orthopnea.  Dermatological: No rash, lesions/masses Respiratory: Positive for dyspnea and productive cough. Urologic: No hematuria, dysuria Abdominal:   No nausea, vomiting, diarrhea, bright red blood per rectum, melena, or hematemesis Neurologic:  No visual changes, wkns, changes in mental status. All other systems reviewed and are otherwise negative except as noted above.  Physical Exam/Data    Vitals:   03/22/20 0500 03/22/20 0530 03/22/20 0600 03/22/20 0630  BP: 99/75 104/68 (!) 86/75 96/63  Pulse: 88 92 89 88  Resp: 17 18 16 16   Temp: 98.6 F (37 C) 98.6 F (37 C) 98.8 F (37.1 C) 98.8 F (37.1 C)  SpO2: 95% 93% 96% 95%   No intake or output data in the 24 hours ending 03/22/20 1208 There were no vitals filed for this visit. There is no height or weight on file to calculate BMI.   General: Pleasant, elderly female appearing in NAD Psych: Normal  affect. Neuro: Alert and oriented X 3. Moves all extremities spontaneously. HEENT: Normal  Neck: Supple without bruits. JVD at 9cm. Lungs:  Resp regular and unlabored, rales along bases with expiratory  wheezing. Heart: RRR no s3, s4, 2/6 holosystolic murmur along Apex. Abdomen: Soft, non-tender, non-distended, BS + x 4.  Extremities: No clubbing or cyanosis. Trace edema RLE, 1+ LLE. DP/PT/Radials 2+ and equal bilaterally.   EKG:  The EKG was personally reviewed and demonstrates: Sinus tachycardia, HR 106 with known LBBB.   Telemetry:  Telemetry was personally reviewed and demonstrates: NSR, HR in 80's to 90's with PAC's. No definitive atrial fibrillation.     Labs/Studies     Relevant CV Studies:  Cardiac Catheterization: 10/2019 Conclusions: 1. Mild to moderate, non-obstructive coronary artery disease, including 15% ostial LMCA, 20% proximal and mid LAD, and 40% mid LCx stenoses, as well as 50% proximal and 30% mid/distal RCA lesions. 2. 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  The aneurysm appears larger compared with 2010. 3. Patent stent in the mid RCA with minimal in-stent restenosis. 4. Normal right and left heart filling pressures. 5. Mild pulmonary hypertension. 6. Normal Fick cardiac output/index.  Recommendations: 1. Optimize evidence-based medical therapy for acute systolic heart failure.  Reduction in left ventricular ejection fraction is out of proportion to the degree of coronary artery disease, consistent with non-ischemic cardiomyopathy. 2. Aggressive secondary prevention of coronary artery disease.  Echocardiogram: 03/21/2020 IMPRESSIONS    1. Images are limited.  2. Left ventricular ejection fraction, by estimation, is approximately  20%. The left ventricle has severely decreased function. The left  ventricle demonstrates global hypokinesis with some regional variation.  Left ventricular diastolic parameters are  consistent with Grade II diastolic dysfunction (pseudonormalization).  3. Right ventricular systolic function is moderately reduced. The right  ventricular size is normal. There is moderately  elevated pulmonary artery  systolic pressure. The estimated right ventricular systolic pressure is  09.9 mmHg.  4. Left atrial size was mild to moderately dilated.  5. Right atrial size was mildly dilated.  6. The mitral valve is abnormal. Moderate to severe mitral valve  regurgitation. Moderate mitral annular calcification.  7. The aortic valve is tricuspid. There is mild calcification of the  aortic valve. Aortic valve regurgitation is mild.  8. The inferior vena cava is dilated in size with >50% respiratory  variability, suggesting right atrial pressure of 8 mmHg.    Laboratory Data:  Chemistry Recent Labs  Lab 03/21/20 0954 03/22/20 0315  NA 134* 132*  K 4.5 4.8  CL 98 102  CO2 12* 18*  GLUCOSE 126* 125*  BUN 22 42*  CREATININE 1.75* 2.49*  CALCIUM 8.5* 8.1*  GFRNONAA 28* 18*  ANIONGAP  --  12    Recent Labs  Lab 03/21/20 0954 03/22/20 0315  PROT 7.0 6.5  ALBUMIN 3.6 3.4*  AST 258* 519*  ALT 144* 365*  ALKPHOS 100 95  BILITOT 0.9 0.8   Hematology Recent Labs  Lab 03/21/20 0954 03/21/20 2114  WBC 16.6* 16.0*  RBC 4.30 4.05  HGB 10.5* 9.6*  HCT 36.9 32.1*  MCV 85.8 79.3*  MCH 24.4* 23.7*  MCHC 28.5* 29.9*  RDW 21.7* 21.0*  PLT 273 199   Cardiac EnzymesNo results for input(s): TROPONINI in the last 168 hours. No results for input(s): TROPIPOC in the last 168 hours.  BNP Recent Labs  Lab 03/21/20 0954  BNP >4,500.0*    DDimer No results for input(s): DDIMER  in the last 168 hours.  Radiology/Studies:  DG Chest Portable 1 View  Result Date: 03/21/2020 CLINICAL DATA:  Shortness of breath, increased shortness of breath with COPD EXAM: PORTABLE CHEST 1 VIEW COMPARISON:  December 07, 2019 FINDINGS: Trachea midline. Cardiomediastinal contours remain markedly enlarged. Configuration of the heart raising the question of pericardial effusion though not significantly changed from prior study. Aortic atherosclerosis in the aortic arch. Partially obscured LEFT  hemidiaphragm. Increased interstitial markings without lobar consolidation. Limited assessment of skeletal structures without acute process. IMPRESSION: 1. Marked enlargement of the cardiomediastinal contours with findings of pulmonary edema and possible LEFT effusion. 2. Partially obscured LEFT hemidiaphragm may represent atelectasis or developing infiltrate. 3. Cardiac configuration raising the question of pericardial effusion though not significantly changed when compared to the previous exam. Electronically Signed   By: Zetta Bills M.D.   On: 03/21/2020 10:27   US Abdomen Limited RUQ  Result Date: 03/21/2020 CLINICAL DATA:  Increased LFTs. EXAM: ULTRASOUND ABDOMEN LIMITED RIGHT UPPER QUADRANT COMPARISON:  None. FINDINGS: Gallbladder: Status post cholecystectomy. Common bile duct: Diameter: 11 mm, which is dilated. Liver: No focal lesion identified, although evaluation is limited secondary to poor sonographic window. Mildly increased echogenicity of the liver diffusely. Portal vein is patent on color Doppler imaging with normal direction of blood flow towards the liver. Other: Right pleural effusion. IMPRESSION: 1. The proximal common bile duct is dilated, measuring 11 mm. No evidence of proximal obstructing lesion; however, the distal common bile duct is not well evaluated sonographically. If there is concern for obstruction, MRI/MRCP or CT could further evaluate. 2. Mildly increased echogenicity of the liver diffusely, which may relate to hepatic steatosis or chronic hepatic disease. 3. Right pleural effusion. 4. Status post cholecystectomy. Electronically Signed   By: Margaretha Sheffield MD   On: 03/21/2020 13:27     Assessment & Plan   1. Chronic Systolic CHF/NICM - Presented with worsening dyspnea, orthopnea and edema for the past week and has been without her medications (including Lasix) for over 2 weeks.  -  BNP > 4500. Initial HS Troponin 81 with repeat of 90.  CXR showed pulmonary edema  and possible left effusion and concern for pericardial effusion. Repeat echo shows her EF remains reduced at 20% which is similar to prior imaging.  - She has been started on IV Lasix 60mg  BID and unfortunately I&O's nor weight have been recorded. Creatinine has worsened to 2.49 and LFT's further elevated to 519 and 365. Was switched to PO Lasix by the admitting team but given her volume overload, would continue with IV Lasix at a lower dose of 40mg  for now. Thankfully, she is feeling much improved. Given her hypotension, PTA Toprol-XL and Spironolactone have been held. She did have normal cardiac output by [facility] in 10/2019 but if BP does not allow for adequate diuresis, she may require transfer for evaluation by the Advanced Heart Failure team and require inotropic support. Might ultimately be a candidate for CRT given her LBBB but would need to demonstrate compliance with medications and outpatient visits.   2. CAD - She is s/p BMS to RCA in 2010 with recent cath in 10/2019 showing mild to moderate disease and patent RCA stent with minimal ISR with reduction in LV function out of proportion to the degree of CAD.  - Continue ASA 81mg  daily. BB held due to hypotension and PTA Simvastatin held due to elevated LFT's. At the time of discharge, would recommend reducing Simvastatin to 40mg  daily or switching  to a different statin as she was on Simvastatin 80mg  daily prior to admission.   3. HTN - BP has actually been soft over the past 24 hours with SBP in the 80's at times. Stable at 96/63 on most recent check.  - PTA Toprol-XL 25mg  daily and Spironolactone 12.5mg  daily currently held.   4. Mitral Regurgitation - In a moderate to severe range by repeat echocardiogram this admission. Continue to follow.   5. COPD Exacerbation - Receiving steroids and scheduled nebulizer treatments. Further management per admitting team.   6. AKI - Baseline creatinine 1.0 - 1.1. At 1.75 on admission and up to 2.49 today.  Diuretics being adjusted as outlined above.   7. Elevated LFT's -  AST 258 and ALT 144 on admission, at 519 and 365 today. Abdominal US showed dilation of the bile duct with no evidence of obstruction. Suspect secondary to hepatic congestion. Continue to follow with diuresis.    For questions or updates, please contact [place] Please consult [email or web address] for contact info under Cardiology/STEMI.  Signed, Erma Heritage, PA-C 03/22/2020, 12:08 PM Pager: [phone]  Patient seen and examined   I agree with findings as noted above by B Strader.  Pt is a 74 yo with hx of CAD, CV dz, COPD and systolic CHFwho presented to ED for evaluation of worsening SOB    On arrival pt hypoxic  CXR with pulmonary edema  Pt started on IV lasix   Echo done showing LVEF 20%; RVEF mod depressed; mod to severe MR   Lasix stopped with increase in Cr to 2.49  Pt currently still SOB   But improved On exam,  JVP is increased Cardiac ex:  RRR  Gr III/VI systolic murmur at apex Lungs:   Mild rales and wheezes Abd is supple   Ext are with 1+ edema  As noted above by B Strader would continue IV lasix   Folllow UO and Cr.  Resume meds as blood pressure allows If she does not respond may need to consider PICC line for possible inotropic support  Follow labs (Cr, LFTs).  Dorris Carnes MD

## 2020-03-22 NOTE — ED Notes (Signed)
ED TO INPATIENT HANDOFF REPORT  ED Nurse Name and Phone #:  [phone]  S Name/Age/Gender [name] 74 y.o. female Room/Bed: APA02/APA02  Code Status   Code Status: Full Code  Home/SNF/Other Home Patient oriented to: self, place, time and situation Is this baseline? Yes   Triage Complete: Triage complete  Chief Complaint Acute respiratory failure with hypoxia ([place]) [J96.01] Respiratory failure with hypoxia [facility]) [J96.91]  Triage Note [name] called EMS for sob.   Pt found to be sob, HX of HF. EMS placed pt on CPAP with good response. Pts sat low 90's    Allergies Allergies  Allergen Reactions  . Gabapentin Swelling    Level of Care/Admitting Diagnosis ED Disposition    ED Disposition Condition [place] [facility] Area: [facility] [[id number]]  Level of Care: Telemetry [5]  Covid Evaluation: Confirmed COVID Negative  Diagnosis: Respiratory failure with hypoxia [facility]) [[id number]]  Admitting Physician: Rolla Plate [[id number]]  Attending Physician: Rolla Plate [[id number]]  Estimated length of stay: past midnight tomorrow  Certification:: I certify this patient will need inpatient services for at least 2 midnights       B Medical/Surgery History Past Medical History:  Diagnosis Date  . Breast cancer ([place])   . Carotid artery stenosis   . CHF (congestive heart failure) (HCC)    a. EF 20% by echocardiogram in 09/2019  . COPD (chronic obstructive pulmonary disease) ([place])   . Coronary artery disease    a. s/p BMS to RCA in 2010 b. cath in 10/2019 showing mild to moderate disease and patent RCA stent with minimal ISR with reduction in LV function out of proportion to the degree of CAD)  . Depression   . Enlargement of lymph node   . GERD (gastroesophageal reflux disease)   . Hyperlipidemia   . Hypertension   . LBP (low back pain)   . Mitral regurgitation   . Osteoarthritis   . Sciatica   . Systolic congestive heart failure  ([place])   . Tobacco user   . Urge urinary incontinence    Past Surgical History:  Procedure Laterality Date  . APPENDECTOMY     as a child  . BACK SURGERY  1995  . bare-metal stenting of critical mid ICA stenosis     w/ ulcerated plaque/thrombus  . CAROTID ENDARTERECTOMY    . [place]  . CHOLECYSTECTOMY  1980s  . MASTECTOMY  1992   L  . RIGHT/LEFT HEART CATH AND CORONARY ANGIOGRAPHY N/A 10/09/2019   Procedure: RIGHT/LEFT HEART CATH AND CORONARY ANGIOGRAPHY;  Surgeon: Nelva Bush, MD;  Location: [place] [facility];  Service: Cardiovascular;  Laterality: N/A;     A IV Location/Drains/Wounds Patient Lines/Drains/Airways Status    Active Line/Drains/Airways    Name Placement date Placement time Site Days   Peripheral IV 03/21/20 Left Antecubital 03/21/20  0946  Antecubital  1   Peripheral IV 03/21/20 Right Antecubital 03/21/20  0947  Antecubital  1   Urethral Catheter Temperature probe 03/22/20  0237  Temperature probe  less than 1   Incision (Closed) 12/05/19 Breast Left 12/05/19  1122   108          Intake/Output Last 24 hours No intake or output data in the 24 hours ending 03/22/20 1512  Labs/Imaging Results for orders placed or performed during the hospital encounter of 03/21/20 (from the past 48 hour(s))  CBG monitoring, ED     Status: Abnormal  Collection Time: 03/21/20  9:46 AM  Result Value Ref Range   Glucose-Capillary 114 (H) 70 - 99 mg/dL    Comment: Glucose reference range applies only to samples taken after fasting for at least 8 hours.  Respiratory Panel by RT PCR (Flu A&B, Covid) - Nasopharyngeal Swab     Status: None   Collection Time: 03/21/20  9:53 AM   Specimen: Nasopharyngeal Swab  Result Value Ref Range   SARS Coronavirus 2 by RT PCR NEGATIVE NEGATIVE    Comment: (NOTE) SARS-CoV-2 target nucleic acids are NOT DETECTED.  The SARS-CoV-2 RNA is generally detectable in upper respiratoy specimens during the acute phase of  infection. The lowest concentration of SARS-CoV-2 viral copies this assay can detect is 131 copies/mL. A negative result does not preclude SARS-Cov-2 infection and should not be used as the sole basis for treatment or other patient management decisions. A negative result may occur with  improper specimen collection/handling, submission of specimen other than nasopharyngeal swab, presence of viral mutation(s) within the areas targeted by this assay, and inadequate number of viral copies (<131 copies/mL). A negative result must be combined with clinical observations, patient history, and epidemiological information. The expected result is Negative.  Fact Sheet for Patients:  PinkCheek.be  Fact Sheet for Healthcare Providers:  GravelBags.it  This test is no t yet approved or cleared by the [place] FDA and  has been authorized for detection and/or diagnosis of SARS-CoV-2 by FDA under an Emergency Use Authorization (EUA). This EUA will remain  in effect (meaning this test can be used) for the duration of the COVID-19 declaration under Section 564(b)(1) of the Act, 21 U.S.C. section 360bbb-3(b)(1), unless the authorization is terminated or revoked sooner.     Influenza A by PCR NEGATIVE NEGATIVE   Influenza B by PCR NEGATIVE NEGATIVE    Comment: (NOTE) The Xpert Xpress SARS-CoV-2/FLU/RSV assay is intended as an aid in  the diagnosis of influenza from Nasopharyngeal swab specimens and  should not be used as a sole basis for treatment. Nasal washings and  aspirates are unacceptable for Xpert Xpress SARS-CoV-2/FLU/RSV  testing.  Fact Sheet for Patients: PinkCheek.be  Fact Sheet for Healthcare Providers: GravelBags.it  This test is not yet approved or cleared by the [place] FDA and  has been authorized for detection and/or diagnosis of SARS-CoV-2 by  FDA  under an Emergency Use Authorization (EUA). This EUA will remain  in effect (meaning this test can be used) for the duration of the  Covid-19 declaration under Section 564(b)(1) of the Act, 21  U.S.C. section 360bbb-3(b)(1), unless the authorization is  terminated or revoked. Performed at [facility], [address] [place], [place] [address]   Comprehensive metabolic panel     Status: Abnormal   Collection Time: 03/21/20  9:54 AM  Result Value Ref Range   Sodium 134 (L) 135 - 145 mmol/L   Potassium 4.5 3.5 - 5.1 mmol/L   Chloride 98 98 - 111 mmol/L   CO2 12 (L) 22 - 32 mmol/L   Glucose, Bld 126 (H) 70 - 99 mg/dL    Comment: Glucose reference range applies only to samples taken after fasting for at least 8 hours.   BUN 22 8 - 23 mg/dL   Creatinine, Ser 1.75 (H) 0.44 - 1.00 mg/dL   Calcium 8.5 (L) 8.9 - 10.3 mg/dL   Total Protein 7.0 6.5 - 8.1 g/dL   Albumin 3.6 3.5 - 5.0 g/dL   AST 258 (H) 15 -  41 U/L    Comment: RESULTS CONFIRMED BY MANUAL DILUTION   ALT 144 (H) 0 - 44 U/L    Comment: RESULTS CONFIRMED BY MANUAL DILUTION   Alkaline Phosphatase 100 38 - 126 U/L   Total Bilirubin 0.9 0.3 - 1.2 mg/dL   GFR, Estimated 28 (L) >60 mL/min    Comment: Performed at [facility], [address], [place], [place] [address]  Brain natriuretic peptide     Status: Abnormal   Collection Time: 03/21/20  9:54 AM  Result Value Ref Range   B Natriuretic Peptide >4,500.0 (H) 0.0 - 100.0 pg/mL    Comment: Performed at [facility], [address], [place], [place] [address]  Troponin I (High Sensitivity)     Status: Abnormal   Collection Time: 03/21/20  9:54 AM  Result Value Ref Range   Troponin I (High Sensitivity) 81 (H) <18 ng/L    Comment: (NOTE) Elevated high sensitivity troponin I (hsTnI) values and significant  changes across serial measurements may suggest ACS but many other  chronic and acute conditions are known to elevate hsTnI results.  Refer to the Links section for chest pain  algorithms and additional  guidance. Performed at [facility], [address], [place], [place] [address]   CBC with Differential     Status: Abnormal   Collection Time: 03/21/20  9:54 AM  Result Value Ref Range   WBC 16.6 (H) 4.0 - 10.5 K/uL   RBC 4.30 3.87 - 5.11 MIL/uL   Hemoglobin 10.5 (L) 12.0 - 15.0 g/dL   HCT 36.9 36 - 46 %   MCV 85.8 80.0 - 100.0 fL   MCH 24.4 (L) 26.0 - 34.0 pg   MCHC 28.5 (L) 30.0 - 36.0 g/dL   RDW 21.7 (H) 11.5 - 15.5 %   Platelets 273 150 - 400 K/uL   nRBC 0.4 (H) 0.0 - 0.2 %   Neutrophils Relative % 62 %   Neutro Abs 10.3 (H) 1.7 - 7.7 K/uL   Lymphocytes Relative 28 %   Lymphs Abs 4.7 (H) 0.7 - 4.0 K/uL   Monocytes Relative 7 %   Monocytes Absolute 1.2 (H) 0.1 - 1.0 K/uL   Eosinophils Relative 0 %   Eosinophils Absolute 0.0 0.0 - 0.5 K/uL   Basophils Relative 1 %   Basophils Absolute 0.1 0.0 - 0.1 K/uL   Immature Granulocytes 2 %   Abs Immature Granulocytes 0.36 (H) 0.00 - 0.07 K/uL   Polychromasia PRESENT     Comment: Performed at [facility], [address], [place], [place] [address]  Troponin I (High Sensitivity)     Status: Abnormal   Collection Time: 03/21/20 11:49 AM  Result Value Ref Range   Troponin I (High Sensitivity) 90 (H) <18 ng/L    Comment: (NOTE) Elevated high sensitivity troponin I (hsTnI) values and significant  changes across serial measurements may suggest ACS but many other  chronic and acute conditions are known to elevate hsTnI results.  Refer to the "Links" section for chest pain algorithms and additional  guidance. Performed at [facility], [address], [place], [place] [address]   TSH     Status: None   Collection Time: 03/21/20  3:47 PM  Result Value Ref Range   TSH 1.814 0.350 - 4.500 uIU/mL    Comment: Performed by a 3rd Generation assay with a functional sensitivity of <=0.01 uIU/mL. Performed at [facility], [address] [place], [place] [address]   [place]  Status: Abnormal   Collection  Time: 03/21/20  3:47 PM  Result Value Ref Range   Prothrombin Time 16.7 (H) 11.4 - 15.2 seconds   INR 1.4 (H) 0.8 - 1.2    Comment: (NOTE) INR goal varies based on device and disease states. Performed at [facility], [address], [place], [place] [address]   APTT     Status: None   Collection Time: 03/21/20  3:47 PM  Result Value Ref Range   aPTT 30 24 - 36 seconds    Comment: Performed at [facility], [address] [place], [place] [address]  Magnesium     Status: Abnormal   Collection Time: 03/21/20  3:47 PM  Result Value Ref Range   Magnesium 2.5 (H) 1.7 - 2.4 mg/dL    Comment: Performed at [facility], [address], [place], [place] [address]  Hepatitis panel, acute     Status: None   Collection Time: 03/21/20  9:14 PM  Result Value Ref Range   Hepatitis B Surface Ag NON REACTIVE NON REACTIVE   HCV Ab NON REACTIVE NON REACTIVE    Comment: (NOTE) Nonreactive HCV antibody screen is consistent with no HCV infections,  unless recent infection is suspected or other evidence exists to indicate HCV infection.     Hep A IgM NON REACTIVE NON REACTIVE   Hep B C IgM NON REACTIVE NON REACTIVE    Comment: Performed at [facility], [place] [address], [place], [place] [address]  CBC     Status: Abnormal   Collection Time: 03/21/20  9:14 PM  Result Value Ref Range   WBC 16.0 (H) 4.0 - 10.5 K/uL   RBC 4.05 3.87 - 5.11 MIL/uL   Hemoglobin 9.6 (L) 12.0 - 15.0 g/dL   HCT 32.1 (L) 36 - 46 %   MCV 79.3 (L) 80.0 - 100.0 fL    Comment: DELTA CHECK NOTED   MCH 23.7 (L) 26.0 - 34.0 pg   MCHC 29.9 (L) 30.0 - 36.0 g/dL   RDW 21.0 (H) 11.5 - 15.5 %   Platelets 199 150 - 400 K/uL   nRBC 0.2 0.0 - 0.2 %    Comment: Performed at [facility], [address] [place], [place] [address]  Comprehensive metabolic panel     Status: Abnormal   Collection Time: 03/22/20  3:15 AM  Result Value Ref Range   Sodium 132 (L) 135 - 145 mmol/L   Potassium 4.8 3.5 - 5.1 mmol/L   Chloride 102 98 -  111 mmol/L   CO2 18 (L) 22 - 32 mmol/L   Glucose, Bld 125 (H) 70 - 99 mg/dL    Comment: Glucose reference range applies only to samples taken after fasting for at least 8 hours.   BUN 42 (H) 8 - 23 mg/dL   Creatinine, Ser 2.49 (H) 0.44 - 1.00 mg/dL   Calcium 8.1 (L) 8.9 - 10.3 mg/dL   Total Protein 6.5 6.5 - 8.1 g/dL   Albumin 3.4 (L) 3.5 - 5.0 g/dL   AST 519 (H) 15 - 41 U/L   ALT 365 (H) 0 - 44 U/L   Alkaline Phosphatase 95 38 - 126 U/L   Total Bilirubin 0.8 0.3 - 1.2 mg/dL   GFR, Estimated 18 (L) >60 mL/min   Anion gap 12 5 - 15    Comment: Performed at [facility], [address], [place], [place] [address]   DG Chest Portable 1 View  Result Date: 03/21/2020 CLINICAL DATA:  Shortness of breath, increased shortness of breath with COPD EXAM: PORTABLE CHEST 1 VIEW COMPARISON:  December 07, 2019 FINDINGS: Trachea midline. Cardiomediastinal contours remain markedly enlarged. Configuration of the heart raising the question of pericardial effusion though not significantly changed from prior study. Aortic atherosclerosis in the aortic arch. Partially obscured LEFT hemidiaphragm. Increased interstitial markings without lobar consolidation. Limited assessment of skeletal structures without acute process. IMPRESSION: 1. Marked enlargement of the cardiomediastinal contours with findings of pulmonary edema and possible LEFT effusion. 2. Partially obscured LEFT hemidiaphragm may represent atelectasis or developing infiltrate. 3. Cardiac configuration raising the question of pericardial effusion though not significantly changed when compared to the previous exam. Electronically Signed   By: Zetta Bills M.D.   On: 03/21/2020 10:27   ECHOCARDIOGRAM COMPLETE  Result Date: 03/21/2020    ECHOCARDIOGRAM REPORT   Patient Name:   [name] Date of Exam: 03/21/2020 Medical Rec #:  [id number]        Height:       66.5 in Accession #:    [id number]       Weight:       137.1 lb Date of Birth:  16-Jun-1945         BSA:          1.713 m Patient Age:    48 years         BP:           106/74 mmHg Patient Gender: F                HR:           92 bpm. Exam Location:  [facility] Procedure: Cardiac Doppler, Color Doppler and 2D Echo Indications:    Acute systolic chf. Cxr mentions concern for pericardial                 effusion  History:        Patient has prior history of Echocardiogram examinations, most                 recent 12/27/2019. COPD; Risk Factors:Dyslipidemia and Current                 Smoker. Acute respiratory failure, MITRAL REGURGITATION.  Sonographer:    Leavy Cella RDCS (AE) Referring Phys: [id number] [place]  1. Images are limited.  2. Left ventricular ejection fraction, by estimation, is approximately 20%. The left ventricle has severely decreased function. The left ventricle demonstrates global hypokinesis with some regional variation. Left ventricular diastolic parameters are consistent with Grade II diastolic dysfunction (pseudonormalization).  3. Right ventricular systolic function is moderately reduced. The right ventricular size is normal. There is moderately elevated pulmonary artery systolic pressure. The estimated right ventricular systolic pressure is 37.9 mmHg.  4. Left atrial size was mild to moderately dilated.  5. Right atrial size was mildly dilated.  6. The mitral valve is abnormal. Moderate to severe mitral valve regurgitation. Moderate mitral annular calcification.  7. The aortic valve is tricuspid. There is mild calcification of the aortic valve. Aortic valve regurgitation is mild.  8. The inferior vena cava is dilated in size with >50% respiratory variability, suggesting right atrial pressure of 8 mmHg. FINDINGS  Left Ventricle: Left ventricular ejection fraction, by estimation, is 20%. The left ventricle has severely decreased function. The left ventricle demonstrates global hypokinesis. The left ventricular internal cavity size was normal in size. There is no left  ventricular hypertrophy. Abnormal (paradoxical) septal motion,  consistent with left bundle branch block. Left ventricular diastolic parameters are consistent with Grade II diastolic dysfunction (pseudonormalization). Right Ventricle: The right ventricular size is normal. No increase in right ventricular wall thickness. Right ventricular systolic function is moderately reduced. There is moderately elevated pulmonary artery systolic pressure. The tricuspid regurgitant velocity is 3.27 m/s, and with an assumed right atrial pressure of 8 mmHg, the estimated right ventricular systolic pressure is 17.7 mmHg. Left Atrium: Left atrial size was mild to moderately dilated. Right Atrium: Right atrial size was mildly dilated. Pericardium: There is no evidence of pericardial effusion. Mitral Valve: The mitral valve is abnormal. Moderate mitral annular calcification. Moderate to severe mitral valve regurgitation. Tricuspid Valve: The tricuspid valve is grossly normal. Tricuspid valve regurgitation is mild. Aortic Valve: The aortic valve is tricuspid. There is mild calcification of the aortic valve. Aortic valve regurgitation is mild. Pulmonic Valve: The pulmonic valve was not well visualized. Pulmonic valve regurgitation is trivial. Aorta: The aortic root is normal in size and structure. Venous: The inferior vena cava is dilated in size with greater than 50% respiratory variability, suggesting right atrial pressure of 8 mmHg. IAS/Shunts: No atrial level shunt detected by color flow Doppler.  LEFT VENTRICLE PLAX 2D LVIDd:         5.07 cm  Diastology LVIDs:         4.40 cm  LV e' medial:    4.68 cm/s LV PW:         1.25 cm  LV E/e' medial:  20.8 LV IVS:        1.31 cm  LV e' lateral:   7.88 cm/s LVOT diam:     1.90 cm  LV E/e' lateral: 12.3 LVOT Area:     2.84 cm  RIGHT VENTRICLE RV S prime:     11.50 cm/s LEFT ATRIUM             Index       RIGHT ATRIUM           Index LA diam:        3.70 cm 2.16 cm/m  RA Area:     21.00 cm LA  Vol (A2C):   96.8 ml 56.51 ml/m RA Volume:   64.00 ml  37.36 ml/m LA Vol (A4C):   47.1 ml 27.50 ml/m LA Biplane Vol: 70.0 ml 40.87 ml/m   AORTA Ao Root diam: 2.70 cm MITRAL VALVE                 TRICUSPID VALVE MV Area (PHT): 5.34 cm      TR Peak grad:   42.8 mmHg MV Decel Time: 142 msec      TR Vmax:        327.00 cm/s MR Peak grad:    101.6 mmHg MR Mean grad:    60.0 mmHg   SHUNTS MR Vmax:         504.00 cm/s Systemic Diam: 1.90 cm MR Vmean:        357.0 cm/s MR PISA:         3.08 cm MR PISA Eff ROA: 19 mm MR PISA Radius:  0.70 cm MV E velocity: 97.30 cm/s MV A velocity: 52.30 cm/s MV E/A ratio:  1.86 Rozann Lesches MD Electronically signed by Rozann Lesches MD Signature Date/Time: 03/21/2020/4:39:46 PM    Final    US Abdomen Limited RUQ  Result Date: 03/21/2020 CLINICAL DATA:  Increased LFTs. EXAM: ULTRASOUND ABDOMEN LIMITED RIGHT UPPER QUADRANT COMPARISON:  None. FINDINGS:  Gallbladder: Status post cholecystectomy. Common bile duct: Diameter: 11 mm, which is dilated. Liver: No focal lesion identified, although evaluation is limited secondary to poor sonographic window. Mildly increased echogenicity of the liver diffusely. Portal vein is patent on color Doppler imaging with normal direction of blood flow towards the liver. Other: Right pleural effusion. IMPRESSION: 1. The proximal common bile duct is dilated, measuring 11 mm. No evidence of proximal obstructing lesion; however, the distal common bile duct is not well evaluated sonographically. If there is concern for obstruction, MRI/MRCP or CT could further evaluate. 2. Mildly increased echogenicity of the liver diffusely, which may relate to hepatic steatosis or chronic hepatic disease. 3. Right pleural effusion. 4. Status post cholecystectomy. Electronically Signed   By: Margaretha Sheffield MD   On: 03/21/2020 13:27    Pending Labs Unresulted Labs (From admission, onward)          Start     Ordered   03/23/20 0500  CBC with  Differential/Platelet  Daily,   R      03/22/20 1055   03/22/20 0500  Comprehensive metabolic panel  Daily,   R      03/21/20 2100          Vitals/Pain Today's Vitals   03/22/20 1330 03/22/20 1352 03/22/20 1400 03/22/20 1501  BP: 98/63  106/81   Pulse: 96  92   Resp: 20  (!) 21   Temp: 98.4 F (36.9 C)  98.6 F (37 C)   TempSrc:      SpO2: 94% 96% 93%   PainSc:    3     Isolation Precautions No active isolations  Medications Medications  aspirin EC tablet 81 mg (81 mg Oral Given 03/22/20 1248)  traMADol (ULTRAM) tablet 50 mg (50 mg Oral Given 03/22/20 1250)  sodium chloride flush (NS) 0.9 % injection 3 mL (3 mLs Intravenous Not Given 03/22/20 1248)  sodium chloride flush (NS) 0.9 % injection 3 mL (has no administration in time range)  0.9 %  sodium chloride infusion (has no administration in time range)  acetaminophen (TYLENOL) tablet 650 mg (650 mg Oral Given 03/21/20 1558)  ondansetron (ZOFRAN) injection 4 mg (has no administration in time range)  heparin injection 5,000 Units (5,000 Units Subcutaneous Given 03/22/20 0506)  ipratropium-albuterol (DUONEB) 0.5-2.5 (3) MG/3ML nebulizer solution 3 mL (3 mLs Nebulization Given 03/22/20 1352)  predniSONE (DELTASONE) tablet 40 mg (40 mg Oral Not Given 03/22/20 1246)  nicotine (NICODERM CQ - dosed in mg/24 hours) patch 14 mg (14 mg Transdermal Patch Applied 03/22/20 1248)  furosemide (LASIX) injection 40 mg (has no administration in time range)  albuterol (VENTOLIN HFA) 108 (90 Base) MCG/ACT inhaler 6 puff (6 puffs Inhalation Given 03/21/20 0956)  furosemide (LASIX) injection 60 mg (60 mg Intramuscular Given 03/21/20 0958)    Mobility walks Low fall risk   Focused Assessments    R Recommendations: See Admitting Provider Note  Report given to:   Additional Notes:

## 2020-03-22 NOTE — Progress Notes (Signed)
Patient on room air with sats of 96% and is resting comfortably. BIPAP is not needed at this time.

## 2020-03-22 NOTE — Progress Notes (Signed)
Went by to check on patient.  Patient was resting comfortably and had a sat of 95% on RA.

## 2020-03-22 NOTE — Evaluation (Signed)
Physical Therapy Evaluation Patient Details Name: JAZMYN OFFNER MRN: [id number] DOB: Oct 08, 1945 Today's Date: 03/22/2020   History of Present Illness  Ms. 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  Patient unable to provide much history as she is currently receiving BiPAP mask during examination and review of chart and discussing with the ED provider patient's daughter called EMS for shortness of breath.  At that time EMS placed patient on CPAP with good response and noted oxygen saturation in the 90s.    Clinical Impression  Patient functioning near baseline for functional mobility and gait. Patient transitions to EOB with slow labored movements on room air with minimal SOB. She transfers to standing without AD and is unsteady upon initial standing. She ambulates without AD with slightly slow cadence on room air with O2 sat 92-96%. Patient coughing throughout ambulation without loss of balance. Patient returned to bed at end of session. Patient will benefit from continued physical therapy in hospital and recommended venue below to increase strength, balance, endurance for safe ADLs and gait.      Follow Up Recommendations No PT follow up    Equipment Recommendations  None recommended by PT    Recommendations for Other Services       Precautions / Restrictions Precautions Precautions: Fall Restrictions Weight Bearing Restrictions: No      Mobility  Bed Mobility Overal bed mobility: Modified Independent             General bed mobility comments: slow, labored transition to EOB with HOB elevated  Transfers Overall transfer level: Modified independent Equipment used: None             General transfer comment: slightly unsteady upon  initial standing without AD  Ambulation/Gait Ambulation/Gait assistance: Supervision Gait Distance (Feet): 30 Feet Assistive device: None Gait Pattern/deviations: Decreased step length - right;Decreased step length - left;Decreased stride length Gait velocity: decreased   General Gait Details: slightly slow, labored gait  without AD, on room air O2 sat between 92-96% while ambulating, no loss of balance, coughing throughout  Stairs            Wheelchair Mobility    Modified Rankin (Stroke Patients Only)       Balance                                             Pertinent Vitals/Pain Pain Assessment: No/denies pain    Home Living Family/patient expects to be discharged to:: Private residence Living Arrangements: Children (Daughter and Games developer) Available Help at Discharge: Family;Available 24 hours/day Type of Home: Mobile home Home Access: Level entry     Home Layout: One level Home Equipment: None      Prior Function Level of Independence: Independent         Comments: Ind community ambulator without AD, but recently more fatigued and ambulating household distances; drives, Ind with ADLs, daughter or grandson are always home with pt     Hand Dominance        Extremity/Trunk Assessment   Upper Extremity Assessment Upper Extremity Assessment: Overall WFL for tasks assessed    Lower Extremity Assessment Lower Extremity Assessment:  Generalized weakness    Cervical / Trunk Assessment Cervical / Trunk Assessment: Normal  Communication   Communication: HOH  Cognition Arousal/Alertness: Awake/alert Behavior During Therapy: WFL for tasks assessed/performed Overall Cognitive Status: Within Functional Limits for tasks assessed                                        General Comments      Exercises     Assessment/Plan    PT Assessment Patient needs continued PT services  PT Problem List Decreased  strength;Decreased activity tolerance;Decreased balance;Decreased mobility;Decreased knowledge of use of DME;Cardiopulmonary status limiting activity       PT Treatment Interventions DME instruction;Gait training;Stair training;Functional mobility training;Therapeutic activities;Therapeutic exercise;Balance training;Neuromuscular re-education;Patient/family education    PT Goals (Current goals can be found in the Care Plan section)  Acute Rehab PT Goals Patient Stated Goal: Return home PT Goal Formulation: With patient Time For Goal Achievement: 04/05/20 Potential to Achieve Goals: Good    Frequency Min 3X/week   Barriers to discharge        Co-evaluation               AM-PAC PT "6 Clicks" Mobility  Outcome Measure Help needed turning from your back to your side while in a flat bed without using bedrails?: None Help needed moving from lying on your back to sitting on the side of a flat bed without using bedrails?: None Help needed moving to and from a bed to a chair (including a wheelchair)?: None Help needed standing up from a chair using your arms (e.g., wheelchair or bedside chair)?: None Help needed to walk in hospital room?: A Little Help needed climbing 3-5 steps with a railing? : A Little 6 Click Score: 22    End of Session   Activity Tolerance: Patient tolerated treatment well Patient left: in bed;with call bell/phone within reach Nurse Communication: Mobility status PT Visit Diagnosis: Other abnormalities of gait and mobility (R26.89);Muscle weakness (generalized) (M62.81)    Time: 5701-7793 PT Time Calculation (min) (ACUTE ONLY): 18 min   Charges:   PT Evaluation $PT Eval Moderate Complexity: 1 Mod PT Treatments $Therapeutic Activity: 8-22 mins         1:36 PM, 03/22/20 Mearl Latin PT, DPT Physical Therapist at [facility]

## 2020-03-22 NOTE — ED Notes (Signed)
Pt states that she is room air at baseline. After stable vitals noted for prolonged period of time, this RN attempted to wean pt off of [place]. RT notified of plan and kept up to date with pt progress. Pt has hx of COPD. Currently 92-93% on RA. Will continue to monitor.

## 2020-03-23 LAB — CBC WITH DIFFERENTIAL/PLATELET
Abs Immature Granulocytes: 0.14 10*3/uL — ABNORMAL HIGH (ref 0.00–0.07)
Basophils Absolute: 0 10*3/uL (ref 0.0–0.1)
Basophils Relative: 0 %
Eosinophils Absolute: 0 10*3/uL (ref 0.0–0.5)
Eosinophils Relative: 0 %
HCT: 32.6 % — ABNORMAL LOW (ref 36.0–46.0)
Hemoglobin: 9.5 g/dL — ABNORMAL LOW (ref 12.0–15.0)
Immature Granulocytes: 1 %
Lymphocytes Relative: 13 %
Lymphs Abs: 2.4 10*3/uL (ref 0.7–4.0)
MCH: 23.7 pg — ABNORMAL LOW (ref 26.0–34.0)
MCHC: 29.1 g/dL — ABNORMAL LOW (ref 30.0–36.0)
MCV: 81.3 fL (ref 80.0–100.0)
Monocytes Absolute: 1 10*3/uL (ref 0.1–1.0)
Monocytes Relative: 6 %
Neutro Abs: 14.6 10*3/uL — ABNORMAL HIGH (ref 1.7–7.7)
Neutrophils Relative %: 80 %
Platelets: 200 10*3/uL (ref 150–400)
RBC: 4.01 MIL/uL (ref 3.87–5.11)
RDW: 21.1 % — ABNORMAL HIGH (ref 11.5–15.5)
WBC: 18.3 10*3/uL — ABNORMAL HIGH (ref 4.0–10.5)
nRBC: 0.4 % — ABNORMAL HIGH (ref 0.0–0.2)

## 2020-03-23 LAB — COMPREHENSIVE METABOLIC PANEL
ALT: 292 U/L — ABNORMAL HIGH (ref 0–44)
AST: 166 U/L — ABNORMAL HIGH (ref 15–41)
Albumin: 3.4 g/dL — ABNORMAL LOW (ref 3.5–5.0)
Alkaline Phosphatase: 83 U/L (ref 38–126)
Anion gap: 13 (ref 5–15)
BUN: 71 mg/dL — ABNORMAL HIGH (ref 8–23)
CO2: 19 mmol/L — ABNORMAL LOW (ref 22–32)
Calcium: 7.9 mg/dL — ABNORMAL LOW (ref 8.9–10.3)
Chloride: 99 mmol/L (ref 98–111)
Creatinine, Ser: 3.27 mg/dL — ABNORMAL HIGH (ref 0.44–1.00)
GFR, Estimated: 13 mL/min — ABNORMAL LOW (ref >60)
Glucose, Bld: 82 mg/dL (ref 70–99)
Potassium: 4.2 mmol/L (ref 3.5–5.1)
Sodium: 131 mmol/L — ABNORMAL LOW (ref 135–145)
Total Bilirubin: 0.7 mg/dL (ref 0.3–1.2)
Total Protein: 6.2 g/dL — ABNORMAL LOW (ref 6.5–8.1)

## 2020-03-23 MED ORDER — IPRATROPIUM-ALBUTEROL 0.5-2.5 (3) MG/3ML IN SOLN
3.0000 mL | Freq: Three times a day (TID) | RESPIRATORY_TRACT | Status: DC
  Administered 2020-03-24 – 2020-03-26 (×7): 3 mL via RESPIRATORY_TRACT
  Filled 2020-03-23 (×7): qty 3

## 2020-03-23 NOTE — Assessment & Plan Note (Signed)
-   considered due to hepatic congestion from CHF - LFTs are downtrending on lasix - continue trending

## 2020-03-23 NOTE — Assessment & Plan Note (Addendum)
-  BNP>4500 on admission with pulm edema and LE edema on admission.  Also had not taken Lasix for approximately 2 weeks -Elevated troponins on admission considered due to demand in setting of CHF exacerbation -She has diuresed some with improvement in her swelling and breathing as she was initially requiring BiPAP on admission and has now been deescalated down to room air -Renal function however continues to worsen.  Unclear urine output completely due to inaccurate I&Os initially; will try to follow these - rate of increase of creatinine is less today and may be stabilizing; told patient we will watch one more day and if still worsening, will have to consider transfer -Continue IV Lasix

## 2020-03-23 NOTE — Assessment & Plan Note (Signed)
-  Due to CHF exacerbation -Breathing has improved and she has been weaned off of BiPAP to room air

## 2020-03-23 NOTE — Assessment & Plan Note (Addendum)
-  Differential includes ATN from hypoxia versus cardiorenal syndrome given severely reduced EF and CHF exacerbation on admission -Continue IV Lasix and if further uptrend in creatinine on 03/25/2020, will need to discuss transfer -Currently does have mild nongap acidosis but does not meet criteria for urgent dialysis at this time, but will need to be continuously monitored for this - watching output closely (800>>2000>>600); net -2.2L for admission

## 2020-03-23 NOTE — Assessment & Plan Note (Signed)
-  Has been running on the lower limit of normal.  Overnight 1 noted elevation, unsure if real  -Continue trending BP and if consistently remains elevated, will initiate treatment

## 2020-03-23 NOTE — Assessment & Plan Note (Signed)
moderate to severe range by repeat echocardiogram this admission. Continue to follow.

## 2020-03-23 NOTE — Assessment & Plan Note (Signed)
-  Also considered due to CHF exacerbation.  Possible component of COPD -Continue nebs and prednisone

## 2020-03-23 NOTE — Hospital Course (Signed)
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 There was concern for some noncompliance with meds (outs of Lasix for 2 weeks at home) and she was found to be in acute CHF with acute hypoxic respiratory failure.  BNP>4500. CXR noted with pulmonary edema.  She was started on BiPAP and IV Lasix.  She was also started on nebulizers and prednisone in case of superimposed COPD exacerbation. Other work-up was notable for elevated LFTs considered due to hepatic congestion from CHF as well. Despite IV diuresis with Lasix, creatinine and BUN continued to rise. Urine output not fully evaluated initially but noted to be clear yellow in her foley bag.  Cardiology was also evaluated on admission and she was continued on IV Lasix. Recommendation was made for considering transfer if she continues to not respond to IV Lasix.

## 2020-03-23 NOTE — Progress Notes (Signed)
PROGRESS NOTE    [name]   NTI:[id number]  DOB: [birth year]  DOA: 03/21/2020     2  PCP: Patient, No Pcp Per  CC: SOB  Hospital Course: 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 There was concern for some noncompliance with meds (outs of Lasix for 2 weeks at home) and she was found to be in acute CHF with acute hypoxic respiratory failure.  BNP>4500. CXR noted with pulmonary edema.  She was started on BiPAP and IV Lasix.  She was also started on nebulizers and prednisone in case of superimposed COPD exacerbation. Other work-up was notable for elevated LFTs considered due to hepatic congestion from CHF as well. Despite IV diuresis with Lasix, creatinine and BUN continued to rise. Urine output not fully evaluated initially but noted to be clear yellow in her foley bag.  Cardiology was also evaluated on admission and she was continued on IV Lasix. Recommendation was made for considering transfer if she continues to not respond to IV Lasix.   Interval History:  No events overnight. Resting in bed this am, no distress. She was actually wanting to go home but told her she is not stable enough and we are still looking at kidney function to stabilize.  Also told her she will have to transfer to [facility] if this becomes necessary if she wishes for appropriate treatment.  Attempted to call daughter to update her as well but no answer.   Old records reviewed in assessment of this patient  ROS: Constitutional: negative for chills, fatigue and fevers, Respiratory: negative for cough, Cardiovascular: negative for chest pain and Gastrointestinal: negative for abdominal pain  Assessment & Plan: * Acute on chronic combined systolic and diastolic CHF (congestive heart failure) (HCC) -BNP>4500 on admission with pulm edema and LE edema on admission.  Also had not taken Lasix for approximately 2  weeks -Elevated troponins on admission considered due to demand in setting of CHF exacerbation -She has diuresed some with improvement in her swelling and breathing as she was initially requiring BiPAP on admission and has now been deescalated down to room air -Renal function however continues to worsen.  Unclear urine output completely due to inaccurate I&Os initially; will try to follow these - if renal function worsens further on 10/17, will pursue transfer to [facility] for CHF team -Continue IV Lasix  AKI (acute kidney injury) ([place]) -Differential includes ATN from hypoxia versus cardiorenal syndrome given severely reduced EF and CHF exacerbation on admission -Continue IV Lasix and if further uptrend in creatinine on 03/24/2020, will need to pursue transfer -Currently does have mild nongap acidosis but does not meet criteria for urgent dialysis at this time, but will need to be continuously monitored for this  Elevated LFTs - considered due to hepatic congestion from CHF - LFTs are downtrending on lasix - continue trending   COPD exacerbation ([place]) -There was concern for possible superimposed COPD exacerbation on admission due to her presenting dyspnea -Reasonable to continue prednisone and duo nebs for now  Hypertension -Has been running on the lower limit of normal.  Overnight 1 noted elevation, unsure if real  -Continue trending BP and if consistently remains elevated, will initiate treatment  COPD (chronic obstructive pulmonary disease) ([place]) - see COPD exac  Coronary atherosclerosis - s/p BMS 2010; last LHC 10/2019 with patent stent no new disease requiring PCI - continue asa - BB  and statin on hold for hypotension and LFTs respectively  - cardiology recommends reducing zocor to 40 mg daily at discharge  Mitral regurgitation moderate to severe range by repeat echocardiogram this admission. Continue to follow.   Acute pulmonary edema (HCC)-resolved as of 03/23/2020 -Due to CHF  exacerbation -Breathing has improved and she has been weaned off of BiPAP to room air  Acute respiratory failure with hypoxemia (HCC)-resolved as of 03/23/2020 -Also considered due to CHF exacerbation.  Possible component of COPD -Continue nebs and prednisone    Antimicrobials: none  DVT prophylaxis: HSQ Code Status: Full Family Communication: attempted to call daughter, no answer Disposition Plan: Status is: Inpatient  Remains inpatient appropriate because:Unsafe d/c plan, IV treatments appropriate due to intensity of illness or inability to take PO and Inpatient level of care appropriate due to severity of illness   Dispo: The patient is from: Home              Anticipated d/c is to: Home              Anticipated d/c date is: 3 days              Patient currently is not medically stable to d/c.       Objective: Blood pressure (!) 153/126, pulse 86, temperature 98 F (36.7 C), resp. rate 17, height 5\' 6"  (1.676 m), weight 66 kg, SpO2 95 %.  Examination: General appearance: alert, cooperative and no distress Head: Normocephalic, without obvious abnormality, atraumatic Eyes: EOMI Lungs: clear to auscultation bilaterally Heart: regular rate and rhythm and S1, S2 normal Abdomen: normal findings: bowel sounds normal and soft, non-tender Extremities: no edema Skin: mobility and turgor normal Neurologic: Grossly normal  Consultants:   Cardiology  Procedures:   none  Data Reviewed: I have personally reviewed following labs and imaging studies Results for orders placed or performed during the hospital encounter of 03/21/20 (from the past 24 hour(s))  Comprehensive metabolic panel     Status: Abnormal   Collection Time: 03/23/20  9:13 AM  Result Value Ref Range   Sodium 131 (L) 135 - 145 mmol/L   Potassium 4.2 3.5 - 5.1 mmol/L   Chloride 99 98 - 111 mmol/L   CO2 19 (L) 22 - 32 mmol/L   Glucose, Bld 82 70 - 99 mg/dL   BUN 71 (H) 8 - 23 mg/dL   Creatinine, Ser  3.27 (H) 0.44 - 1.00 mg/dL   Calcium 7.9 (L) 8.9 - 10.3 mg/dL   Total Protein 6.2 (L) 6.5 - 8.1 g/dL   Albumin 3.4 (L) 3.5 - 5.0 g/dL   AST 166 (H) 15 - 41 U/L   ALT 292 (H) 0 - 44 U/L   Alkaline Phosphatase 83 38 - 126 U/L   Total Bilirubin 0.7 0.3 - 1.2 mg/dL   GFR, Estimated 13 (L) >60 mL/min   Anion gap 13 5 - 15  CBC with Differential/Platelet     Status: Abnormal   Collection Time: 03/23/20  9:13 AM  Result Value Ref Range   WBC 18.3 (H) 4.0 - 10.5 K/uL   RBC 4.01 3.87 - 5.11 MIL/uL   Hemoglobin 9.5 (L) 12.0 - 15.0 g/dL   HCT 32.6 (L) 36 - 46 %   MCV 81.3 80.0 - 100.0 fL   MCH 23.7 (L) 26.0 - 34.0 pg   MCHC 29.1 (L) 30.0 - 36.0 g/dL   RDW 21.1 (H) 11.5 - 15.5 %   Platelets 200 150 - 400 K/uL  nRBC 0.4 (H) 0.0 - 0.2 %   Neutrophils Relative % 80 %   Neutro Abs 14.6 (H) 1.7 - 7.7 K/uL   Lymphocytes Relative 13 %   Lymphs Abs 2.4 0.7 - 4.0 K/uL   Monocytes Relative 6 %   Monocytes Absolute 1.0 0.1 - 1.0 K/uL   Eosinophils Relative 0 %   Eosinophils Absolute 0.0 0.0 - 0.5 K/uL   Basophils Relative 0 %   Basophils Absolute 0.0 0.0 - 0.1 K/uL   RBC Morphology MACROCYTOSIS PRESENT    Immature Granulocytes 1 %   Abs Immature Granulocytes 0.14 (H) 0.00 - 0.07 K/uL   Polychromasia PRESENT    Target Cells PRESENT    Ovalocytes PRESENT     Recent Results (from the past 240 hour(s))  Respiratory Panel by RT PCR (Flu A&B, Covid) - Nasopharyngeal Swab     Status: None   Collection Time: 03/21/20  9:53 AM   Specimen: Nasopharyngeal Swab  Result Value Ref Range Status   SARS Coronavirus 2 by RT PCR NEGATIVE NEGATIVE Final    Comment: (NOTE) SARS-CoV-2 target nucleic acids are NOT DETECTED.  The SARS-CoV-2 RNA is generally detectable in upper respiratoy specimens during the acute phase of infection. The lowest concentration of SARS-CoV-2 viral copies this assay can detect is 131 copies/mL. A negative result does not preclude SARS-Cov-2 infection and should not be used as the  sole basis for treatment or other patient management decisions. A negative result may occur with  improper specimen collection/handling, submission of specimen other than nasopharyngeal swab, presence of viral mutation(s) within the areas targeted by this assay, and inadequate number of viral copies (<131 copies/mL). A negative result must be combined with clinical observations, patient history, and epidemiological information. The expected result is Negative.  Fact Sheet for Patients:  PinkCheek.be  Fact Sheet for Healthcare Providers:  GravelBags.it  This test is no t yet approved or cleared by the [place] FDA and  has been authorized for detection and/or diagnosis of SARS-CoV-2 by FDA under an Emergency Use Authorization (EUA). This EUA will remain  in effect (meaning this test can be used) for the duration of the COVID-19 declaration under Section 564(b)(1) of the Act, 21 U.S.C. section 360bbb-3(b)(1), unless the authorization is terminated or revoked sooner.     Influenza A by PCR NEGATIVE NEGATIVE Final   Influenza B by PCR NEGATIVE NEGATIVE Final    Comment: (NOTE) The Xpert Xpress SARS-CoV-2/FLU/RSV assay is intended as an aid in  the diagnosis of influenza from Nasopharyngeal swab specimens and  should not be used as a sole basis for treatment. Nasal washings and  aspirates are unacceptable for Xpert Xpress SARS-CoV-2/FLU/RSV  testing.  Fact Sheet for Patients: PinkCheek.be  Fact Sheet for Healthcare Providers: GravelBags.it  This test is not yet approved or cleared by the [place] FDA and  has been authorized for detection and/or diagnosis of SARS-CoV-2 by  FDA under an Emergency Use Authorization (EUA). This EUA will remain  in effect (meaning this test can be used) for the duration of the  Covid-19 declaration under Section 564(b)(1) of  the Act, 21  U.S.C. section 360bbb-3(b)(1), unless the authorization is  terminated or revoked. Performed at [facility], [address] [place], [place] [address]      Radiology Studies: ECHOCARDIOGRAM COMPLETE  Result Date: 03/21/2020    ECHOCARDIOGRAM REPORT   Patient Name:   [name] Date of Exam: 03/21/2020 Medical Rec #:  [id number]  Height:       66.5 in Accession #:    [id number]       Weight:       137.1 lb Date of Birth:  04/19/1946        BSA:          1.713 m Patient Age:    5 years         BP:           106/74 mmHg Patient Gender: F                HR:           92 bpm. Exam Location:  [facility] Procedure: Cardiac Doppler, Color Doppler and 2D Echo Indications:    Acute systolic chf. Cxr mentions concern for pericardial                 effusion  History:        Patient has prior history of Echocardiogram examinations, most                 recent 12/27/2019. COPD; Risk Factors:Dyslipidemia and Current                 Smoker. Acute respiratory failure, MITRAL REGURGITATION.  Sonographer:    Leavy Cella RDCS (AE) Referring Phys: [id number] [place]  1. Images are limited.  2. Left ventricular ejection fraction, by estimation, is approximately 20%. The left ventricle has severely decreased function. The left ventricle demonstrates global hypokinesis with some regional variation. Left ventricular diastolic parameters are consistent with Grade II diastolic dysfunction (pseudonormalization).  3. Right ventricular systolic function is moderately reduced. The right ventricular size is normal. There is moderately elevated pulmonary artery systolic pressure. The estimated right ventricular systolic pressure is 43.3 mmHg.  4. Left atrial size was mild to moderately dilated.  5. Right atrial size was mildly dilated.  6. The mitral valve is abnormal. Moderate to severe mitral valve regurgitation. Moderate mitral annular calcification.  7. The aortic valve is tricuspid.  There is mild calcification of the aortic valve. Aortic valve regurgitation is mild.  8. The inferior vena cava is dilated in size with >50% respiratory variability, suggesting right atrial pressure of 8 mmHg. FINDINGS  Left Ventricle: Left ventricular ejection fraction, by estimation, is 20%. The left ventricle has severely decreased function. The left ventricle demonstrates global hypokinesis. The left ventricular internal cavity size was normal in size. There is no left ventricular hypertrophy. Abnormal (paradoxical) septal motion, consistent with left bundle branch block. Left ventricular diastolic parameters are consistent with Grade II diastolic dysfunction (pseudonormalization). Right Ventricle: The right ventricular size is normal. No increase in right ventricular wall thickness. Right ventricular systolic function is moderately reduced. There is moderately elevated pulmonary artery systolic pressure. The tricuspid regurgitant velocity is 3.27 m/s, and with an assumed right atrial pressure of 8 mmHg, the estimated right ventricular systolic pressure is 29.5 mmHg. Left Atrium: Left atrial size was mild to moderately dilated. Right Atrium: Right atrial size was mildly dilated. Pericardium: There is no evidence of pericardial effusion. Mitral Valve: The mitral valve is abnormal. Moderate mitral annular calcification. Moderate to severe mitral valve regurgitation. Tricuspid Valve: The tricuspid valve is grossly normal. Tricuspid valve regurgitation is mild. Aortic Valve: The aortic valve is tricuspid. There is mild calcification of the aortic valve. Aortic valve regurgitation is mild. Pulmonic Valve: The pulmonic valve was not well visualized. Pulmonic valve regurgitation is trivial. Aorta: The aortic root  is normal in size and structure. Venous: The inferior vena cava is dilated in size with greater than 50% respiratory variability, suggesting right atrial pressure of 8 mmHg. IAS/Shunts: No atrial level shunt  detected by color flow Doppler.  LEFT VENTRICLE PLAX 2D LVIDd:         5.07 cm  Diastology LVIDs:         4.40 cm  LV e' medial:    4.68 cm/s LV PW:         1.25 cm  LV E/e' medial:  20.8 LV IVS:        1.31 cm  LV e' lateral:   7.88 cm/s LVOT diam:     1.90 cm  LV E/e' lateral: 12.3 LVOT Area:     2.84 cm  RIGHT VENTRICLE RV S prime:     11.50 cm/s LEFT ATRIUM             Index       RIGHT ATRIUM           Index LA diam:        3.70 cm 2.16 cm/m  RA Area:     21.00 cm LA Vol (A2C):   96.8 ml 56.51 ml/m RA Volume:   64.00 ml  37.36 ml/m LA Vol (A4C):   47.1 ml 27.50 ml/m LA Biplane Vol: 70.0 ml 40.87 ml/m   AORTA Ao Root diam: 2.70 cm MITRAL VALVE                 TRICUSPID VALVE MV Area (PHT): 5.34 cm      TR Peak grad:   42.8 mmHg MV Decel Time: 142 msec      TR Vmax:        327.00 cm/s MR Peak grad:    101.6 mmHg MR Mean grad:    60.0 mmHg   SHUNTS MR Vmax:         504.00 cm/s Systemic Diam: 1.90 cm MR Vmean:        357.0 cm/s MR PISA:         3.08 cm MR PISA Eff ROA: 19 mm MR PISA Radius:  0.70 cm MV E velocity: 97.30 cm/s MV A velocity: 52.30 cm/s MV E/A ratio:  1.86 Rozann Lesches MD Electronically signed by Rozann Lesches MD Signature Date/Time: 03/21/2020/4:39:46 PM    Final    US Abdomen Limited RUQ  Final Result    DG Chest Portable 1 View  Final Result      Scheduled Meds: . aspirin EC  81 mg Oral Daily  . Chlorhexidine Gluconate Cloth  6 each Topical Daily  . furosemide  40 mg Intravenous BID  . heparin  5,000 Units Subcutaneous Q8H  . ipratropium-albuterol  3 mL Nebulization Q6H  . nicotine  14 mg Transdermal Daily  . predniSONE  40 mg Oral Q breakfast  . sodium chloride flush  3 mL Intravenous Q12H   PRN Meds: sodium chloride, acetaminophen, ondansetron (ZOFRAN) IV, sodium chloride flush, traMADol Continuous Infusions: . sodium chloride        LOS: 2 days  Time spent: Greater than 50% of the 35 minute visit was spent in counseling/coordination of care for the  patient as laid out in the A&P.   Dwyane Dee, MD [facility] 03/23/2020, 2:42 PM

## 2020-03-23 NOTE — Assessment & Plan Note (Signed)
-   s/p BMS 2010; last LHC 10/2019 with patent stent no new disease requiring PCI - continue asa - BB and statin on hold for hypotension and LFTs respectively  - cardiology recommends reducing zocor to 40 mg daily at discharge

## 2020-03-23 NOTE — Assessment & Plan Note (Signed)
-   see COPD exac ?

## 2020-03-23 NOTE — Assessment & Plan Note (Signed)
-  There was concern for possible superimposed COPD exacerbation on admission due to her presenting dyspnea -Reasonable to continue prednisone and duo nebs for now

## 2020-03-24 LAB — CBC WITH DIFFERENTIAL/PLATELET
Abs Immature Granulocytes: 0.13 10*3/uL — ABNORMAL HIGH (ref 0.00–0.07)
Basophils Absolute: 0 10*3/uL (ref 0.0–0.1)
Basophils Relative: 0 %
Eosinophils Absolute: 0 10*3/uL (ref 0.0–0.5)
Eosinophils Relative: 0 %
HCT: 29.4 % — ABNORMAL LOW (ref 36.0–46.0)
Hemoglobin: 9.1 g/dL — ABNORMAL LOW (ref 12.0–15.0)
Immature Granulocytes: 1 %
Lymphocytes Relative: 8 %
Lymphs Abs: 0.9 10*3/uL (ref 0.7–4.0)
MCH: 24.1 pg — ABNORMAL LOW (ref 26.0–34.0)
MCHC: 31 g/dL (ref 30.0–36.0)
MCV: 77.8 fL — ABNORMAL LOW (ref 80.0–100.0)
Monocytes Absolute: 0.7 10*3/uL (ref 0.1–1.0)
Monocytes Relative: 6 %
Neutro Abs: 10.1 10*3/uL — ABNORMAL HIGH (ref 1.7–7.7)
Neutrophils Relative %: 85 %
Platelets: 157 10*3/uL (ref 150–400)
RBC: 3.78 MIL/uL — ABNORMAL LOW (ref 3.87–5.11)
RDW: 20.9 % — ABNORMAL HIGH (ref 11.5–15.5)
WBC: 11.9 10*3/uL — ABNORMAL HIGH (ref 4.0–10.5)
nRBC: 0.7 % — ABNORMAL HIGH (ref 0.0–0.2)

## 2020-03-24 LAB — COMPREHENSIVE METABOLIC PANEL
ALT: 243 U/L — ABNORMAL HIGH (ref 0–44)
AST: 100 U/L — ABNORMAL HIGH (ref 15–41)
Albumin: 3.4 g/dL — ABNORMAL LOW (ref 3.5–5.0)
Alkaline Phosphatase: 82 U/L (ref 38–126)
Anion gap: 12 (ref 5–15)
BUN: 78 mg/dL — ABNORMAL HIGH (ref 8–23)
CO2: 21 mmol/L — ABNORMAL LOW (ref 22–32)
Calcium: 8.4 mg/dL — ABNORMAL LOW (ref 8.9–10.3)
Chloride: 98 mmol/L (ref 98–111)
Creatinine, Ser: 3.35 mg/dL — ABNORMAL HIGH (ref 0.44–1.00)
GFR, Estimated: 13 mL/min — ABNORMAL LOW (ref >60)
Glucose, Bld: 131 mg/dL — ABNORMAL HIGH (ref 70–99)
Potassium: 5.2 mmol/L — ABNORMAL HIGH (ref 3.5–5.1)
Sodium: 131 mmol/L — ABNORMAL LOW (ref 135–145)
Total Bilirubin: 0.2 mg/dL — ABNORMAL LOW (ref 0.3–1.2)
Total Protein: 6.4 g/dL — ABNORMAL LOW (ref 6.5–8.1)

## 2020-03-24 LAB — MAGNESIUM: Magnesium: 2.5 mg/dL — ABNORMAL HIGH (ref 1.7–2.4)

## 2020-03-24 MED ORDER — MENTHOL 3 MG MT LOZG
1.0000 | LOZENGE | OROMUCOSAL | Status: DC | PRN
  Administered 2020-03-24: 3 mg via ORAL
  Filled 2020-03-24: qty 9

## 2020-03-24 NOTE — Progress Notes (Signed)
MD notified of pt reported sore throat

## 2020-03-24 NOTE — Progress Notes (Signed)
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 There was concern for some noncompliance with meds (outs of Lasix for 2 weeks at home) and she was found to be in acute CHF with acute hypoxic respiratory failure.  BNP>4500. CXR noted with pulmonary edema.  She was started on BiPAP and IV Lasix.  She was also started on nebulizers and prednisone in case of superimposed COPD exacerbation. Other work-up was notable for elevated LFTs considered due to hepatic congestion from CHF as well. Despite IV diuresis with Lasix, creatinine and BUN continued to rise. Urine output not fully evaluated initially but noted to be clear yellow in her foley bag.  Cardiology was also evaluated on admission and she was continued on IV Lasix. Recommendation was made for considering transfer if she continues to not respond to IV Lasix.   Interval History:  No events overnight. Spoke with daughter yesterday when she called back after missed call. Talked again with daughter today and updated her. Overall, if renal function worsens more or output drops she understands we may consider transfer to [facility].  Otherwise, the patient has no symptoms and has been asking to go home, but knows we have kept her in hospital because of renal function.   Old records reviewed in assessment of this patient  ROS: Constitutional: negative for chills, fatigue and fevers, Respiratory: negative for cough, Cardiovascular: negative for chest pain and Gastrointestinal: negative for abdominal pain  Assessment & Plan: * Acute on chronic combined systolic and diastolic CHF (congestive heart failure) (HCC) -BNP>4500 on admission with pulm edema and LE edema on admission.  Also had not taken Lasix for  approximately 2 weeks -Elevated troponins on admission considered due to demand in setting of CHF exacerbation -She has diuresed some with improvement in her swelling and breathing as she was initially requiring BiPAP on admission and has now been deescalated down to room air -Renal function however continues to worsen.  Unclear urine output completely due to inaccurate I&Os initially; will try to follow these - rate of increase of creatinine is less today and may be stabilizing; told patient we will watch one more day and if still worsening, will have to consider transfer -Continue IV Lasix  AKI (acute kidney injury) ([place]) -Differential includes ATN from hypoxia versus cardiorenal syndrome given severely reduced EF and CHF exacerbation on admission -Continue IV Lasix and if further uptrend in creatinine on 03/25/2020, will need to discuss transfer -Currently does have mild nongap acidosis but does not meet criteria for urgent dialysis at this time, but will need to be continuously monitored for this - watching output closely (800>>2000>>600); net -2.2L for admission   Elevated LFTs - considered due to hepatic congestion from CHF - LFTs are downtrending on lasix - continue trending   COPD exacerbation (HCC) -There was concern for possible superimposed COPD exacerbation on admission due to her presenting dyspnea -Reasonable to continue prednisone and duo nebs for now  Hypertension -Has been running on the lower limit of normal.  Overnight 1 noted elevation, unsure if real  -Continue trending BP and if consistently remains elevated, will initiate treatment  COPD (chronic obstructive pulmonary disease) ([place]) - see COPD exac  Coronary atherosclerosis -  s/p BMS 2010; last LHC 10/2019 with patent stent no new disease requiring PCI - continue asa - BB and statin on hold for hypotension and LFTs respectively  - cardiology recommends reducing zocor to 40 mg daily at discharge  Mitral  regurgitation moderate to severe range by repeat echocardiogram this admission. Continue to follow.   Acute pulmonary edema (HCC)-resolved as of 03/23/2020 -Due to CHF exacerbation -Breathing has improved and she has been weaned off of BiPAP to room air  Acute respiratory failure with hypoxemia (HCC)-resolved as of 03/23/2020 -Also considered due to CHF exacerbation.  Possible component of COPD -Continue nebs and prednisone   Antimicrobials: none  DVT prophylaxis: HSQ Code Status: Full Family Communication: attempted to call daughter, no answer Disposition Plan: Status is: Inpatient  Remains inpatient appropriate because:Unsafe d/c plan, IV treatments appropriate due to intensity of illness or inability to take PO and Inpatient level of care appropriate due to severity of illness   Dispo: The patient is from: Home              Anticipated d/c is to: Home              Anticipated d/c date is: 2 days              Patient currently is not medically stable to d/c.  Objective: Blood pressure 97/63, pulse 81, temperature 98.1 F (36.7 C), resp. rate 18, height 5\' 6"  (1.676 m), weight 66 kg, SpO2 90 %.  Examination: General appearance: alert, cooperative and no distress Head: Normocephalic, without obvious abnormality, atraumatic Eyes: EOMI Lungs: clear to auscultation bilaterally Heart: regular rate and rhythm and S1, S2 normal Abdomen: normal findings: bowel sounds normal and soft, non-tender Extremities: no edema Skin: mobility and turgor normal Neurologic: Grossly normal  Consultants:   Cardiology  Procedures:   none  Data Reviewed: I have personally reviewed following labs and imaging studies Results for orders placed or performed during the hospital encounter of 03/21/20 (from the past 24 hour(s))  Comprehensive metabolic panel     Status: Abnormal   Collection Time: 03/24/20  5:34 AM  Result Value Ref Range   Sodium 131 (L) 135 - 145 mmol/L   Potassium 5.2 (H)  3.5 - 5.1 mmol/L   Chloride 98 98 - 111 mmol/L   CO2 21 (L) 22 - 32 mmol/L   Glucose, Bld 131 (H) 70 - 99 mg/dL   BUN 78 (H) 8 - 23 mg/dL   Creatinine, Ser 3.35 (H) 0.44 - 1.00 mg/dL   Calcium 8.4 (L) 8.9 - 10.3 mg/dL   Total Protein 6.4 (L) 6.5 - 8.1 g/dL   Albumin 3.4 (L) 3.5 - 5.0 g/dL   AST 100 (H) 15 - 41 U/L   ALT 243 (H) 0 - 44 U/L   Alkaline Phosphatase 82 38 - 126 U/L   Total Bilirubin 0.2 (L) 0.3 - 1.2 mg/dL   GFR, Estimated 13 (L) >60 mL/min   Anion gap 12 5 - 15  CBC with Differential/Platelet     Status: Abnormal   Collection Time: 03/24/20  5:34 AM  Result Value Ref Range   WBC 11.9 (H) 4.0 - 10.5 K/uL   RBC 3.78 (L) 3.87 - 5.11 MIL/uL   Hemoglobin 9.1 (L) 12.0 - 15.0 g/dL   HCT 29.4 (L) 36 - 46 %   MCV 77.8 (L) 80.0 - 100.0 fL   MCH 24.1 (L) 26.0 - 34.0 pg   MCHC 31.0 30.0 - 36.0 g/dL  RDW 20.9 (H) 11.5 - 15.5 %   Platelets 157 150 - 400 K/uL   nRBC 0.7 (H) 0.0 - 0.2 %   Neutrophils Relative % 85 %   Neutro Abs 10.1 (H) 1.7 - 7.7 K/uL   Lymphocytes Relative 8 %   Lymphs Abs 0.9 0.7 - 4.0 K/uL   Monocytes Relative 6 %   Monocytes Absolute 0.7 0.1 - 1.0 K/uL   Eosinophils Relative 0 %   Eosinophils Absolute 0.0 0.0 - 0.5 K/uL   Basophils Relative 0 %   Basophils Absolute 0.0 0.0 - 0.1 K/uL   Immature Granulocytes 1 %   Abs Immature Granulocytes 0.13 (H) 0.00 - 0.07 K/uL  Magnesium     Status: Abnormal   Collection Time: 03/24/20  5:34 AM  Result Value Ref Range   Magnesium 2.5 (H) 1.7 - 2.4 mg/dL    Recent Results (from the past 240 hour(s))  Respiratory Panel by RT PCR (Flu A&B, Covid) - Nasopharyngeal Swab     Status: None   Collection Time: 03/21/20  9:53 AM   Specimen: Nasopharyngeal Swab  Result Value Ref Range Status   SARS Coronavirus 2 by RT PCR NEGATIVE NEGATIVE Final    Comment: (NOTE) SARS-CoV-2 target nucleic acids are NOT DETECTED.  The SARS-CoV-2 RNA is generally detectable in upper respiratoy specimens during the acute phase of  infection. The lowest concentration of SARS-CoV-2 viral copies this assay can detect is 131 copies/mL. A negative result does not preclude SARS-Cov-2 infection and should not be used as the sole basis for treatment or other patient management decisions. A negative result may occur with  improper specimen collection/handling, submission of specimen other than nasopharyngeal swab, presence of viral mutation(s) within the areas targeted by this assay, and inadequate number of viral copies (<131 copies/mL). A negative result must be combined with clinical observations, patient history, and epidemiological information. The expected result is Negative.  Fact Sheet for Patients:  PinkCheek.be  Fact Sheet for Healthcare Providers:  GravelBags.it  This test is no t yet approved or cleared by the [place] FDA and  has been authorized for detection and/or diagnosis of SARS-CoV-2 by FDA under an Emergency Use Authorization (EUA). This EUA will remain  in effect (meaning this test can be used) for the duration of the COVID-19 declaration under Section 564(b)(1) of the Act, 21 U.S.C. section 360bbb-3(b)(1), unless the authorization is terminated or revoked sooner.     Influenza A by PCR NEGATIVE NEGATIVE Final   Influenza B by PCR NEGATIVE NEGATIVE Final    Comment: (NOTE) The Xpert Xpress SARS-CoV-2/FLU/RSV assay is intended as an aid in  the diagnosis of influenza from Nasopharyngeal swab specimens and  should not be used as a sole basis for treatment. Nasal washings and  aspirates are unacceptable for Xpert Xpress SARS-CoV-2/FLU/RSV  testing.  Fact Sheet for Patients: PinkCheek.be  Fact Sheet for Healthcare Providers: GravelBags.it  This test is not yet approved or cleared by the [place] FDA and  has been authorized for detection and/or diagnosis of SARS-CoV-2  by  FDA under an Emergency Use Authorization (EUA). This EUA will remain  in effect (meaning this test can be used) for the duration of the  Covid-19 declaration under Section 564(b)(1) of the Act, 21  U.S.C. section 360bbb-3(b)(1), unless the authorization is  terminated or revoked. Performed at [facility], [address] [place], [place] [address]      Radiology Studies: No results found. US Abdomen Limited RUQ  Final Result    DG Chest Portable 1 View  Final Result      Scheduled Meds: . aspirin EC  81 mg Oral Daily  . Chlorhexidine Gluconate Cloth  6 each Topical Daily  . furosemide  40 mg Intravenous BID  . heparin  5,000 Units Subcutaneous Q8H  . ipratropium-albuterol  3 mL Nebulization TID  . nicotine  14 mg Transdermal Daily  . predniSONE  40 mg Oral Q breakfast  . sodium chloride flush  3 mL Intravenous Q12H   PRN Meds: sodium chloride, acetaminophen, menthol-cetylpyridinium, ondansetron (ZOFRAN) IV, sodium chloride flush, traMADol Continuous Infusions: . sodium chloride        LOS: 3 days  Time spent: Greater than 50% of the 35 minute visit was spent in counseling/coordination of care for the patient as laid out in the A&P.   Dwyane Dee, MD [facility] 03/24/2020, 1:58 PM

## 2020-03-25 DIAGNOSIS — J449 Chronic obstructive pulmonary disease, unspecified: Secondary | ICD-10-CM

## 2020-03-25 DIAGNOSIS — I5043 Acute on chronic combined systolic (congestive) and diastolic (congestive) heart failure: Secondary | ICD-10-CM

## 2020-03-25 LAB — BASIC METABOLIC PANEL
Anion gap: 14 (ref 5–15)
Anion gap: 16 — ABNORMAL HIGH (ref 5–15)
BUN: 82 mg/dL — ABNORMAL HIGH (ref 8–23)
BUN: 81 mg/dL — ABNORMAL HIGH (ref 8–23)
CO2: 24 mmol/L (ref 22–32)
CO2: 24 mmol/L (ref 22–32)
Calcium: 8.4 mg/dL — ABNORMAL LOW (ref 8.9–10.3)
Calcium: 8.6 mg/dL — ABNORMAL LOW (ref 8.9–10.3)
Chloride: 96 mmol/L — ABNORMAL LOW (ref 98–111)
Chloride: 95 mmol/L — ABNORMAL LOW (ref 98–111)
Creatinine, Ser: 2.67 mg/dL — ABNORMAL HIGH (ref 0.44–1.00)
Creatinine, Ser: 2.67 mg/dL — ABNORMAL HIGH (ref 0.44–1.00)
GFR, Estimated: 17 mL/min — ABNORMAL LOW (ref >60)
GFR, Estimated: 17 mL/min — ABNORMAL LOW (ref >60)
Glucose, Bld: 117 mg/dL — ABNORMAL HIGH (ref 70–99)
Glucose, Bld: 105 mg/dL — ABNORMAL HIGH (ref 70–99)
Potassium: 4.2 mmol/L (ref 3.5–5.1)
Potassium: 4.1 mmol/L (ref 3.5–5.1)
Sodium: 134 mmol/L — ABNORMAL LOW (ref 135–145)
Sodium: 135 mmol/L (ref 135–145)

## 2020-03-25 LAB — CBC WITH DIFFERENTIAL/PLATELET
Abs Immature Granulocytes: 0.16 10*3/uL — ABNORMAL HIGH (ref 0.00–0.07)
Basophils Absolute: 0 10*3/uL (ref 0.0–0.1)
Basophils Relative: 0 %
Eosinophils Absolute: 0 10*3/uL (ref 0.0–0.5)
Eosinophils Relative: 0 %
HCT: 30.1 % — ABNORMAL LOW (ref 36.0–46.0)
Hemoglobin: 9.3 g/dL — ABNORMAL LOW (ref 12.0–15.0)
Immature Granulocytes: 1 %
Lymphocytes Relative: 11 %
Lymphs Abs: 1.3 10*3/uL (ref 0.7–4.0)
MCH: 24.2 pg — ABNORMAL LOW (ref 26.0–34.0)
MCHC: 30.9 g/dL (ref 30.0–36.0)
MCV: 78.2 fL — ABNORMAL LOW (ref 80.0–100.0)
Monocytes Absolute: 0.8 10*3/uL (ref 0.1–1.0)
Monocytes Relative: 6 %
Neutro Abs: 10.3 10*3/uL — ABNORMAL HIGH (ref 1.7–7.7)
Neutrophils Relative %: 82 %
Platelets: 163 10*3/uL (ref 150–400)
RBC: 3.85 MIL/uL — ABNORMAL LOW (ref 3.87–5.11)
RDW: 20.9 % — ABNORMAL HIGH (ref 11.5–15.5)
WBC: 12.6 10*3/uL — ABNORMAL HIGH (ref 4.0–10.5)
nRBC: 1 % — ABNORMAL HIGH (ref 0.0–0.2)

## 2020-03-25 LAB — HEPATIC FUNCTION PANEL
ALT: 191 U/L — ABNORMAL HIGH (ref 0–44)
AST: 66 U/L — ABNORMAL HIGH (ref 15–41)
Albumin: 3.6 g/dL (ref 3.5–5.0)
Alkaline Phosphatase: 83 U/L (ref 38–126)
Bilirubin, Direct: 0.3 mg/dL — ABNORMAL HIGH (ref 0.0–0.2)
Indirect Bilirubin: 0.5 mg/dL (ref 0.3–0.9)
Total Bilirubin: 0.8 mg/dL (ref 0.3–1.2)
Total Protein: 6.6 g/dL (ref 6.5–8.1)

## 2020-03-25 LAB — BRAIN NATRIURETIC PEPTIDE: B Natriuretic Peptide: 2397 pg/mL — ABNORMAL HIGH (ref 0.0–100.0)

## 2020-03-25 LAB — MAGNESIUM: Magnesium: 2.5 mg/dL — ABNORMAL HIGH (ref 1.7–2.4)

## 2020-03-25 NOTE — Progress Notes (Signed)
[facility]  PROGRESS NOTE  [name] ACZ:[id number] DOB: [birth year] DOA: 03/21/2020 PCP: Patient, No Pcp Per Admit date - 03/21/2020   Admitting Physician Rolla Plate, DO  Outpatient Primary MD for the patient is Patient, No Pcp Per  LOS - 4 Brief Narrative  [name] is a 74 year old female with medical history significant for chronic systolic CHF with EF of 10% (TTE on 12/2019), HTN, HLD, COPD who presented to the ED on 10/14 with complaints of worsening shortness of breath over several weeks but particular over the last few days in setting of poor adherence to her cardiac medications per her daughter and was found to have acute hypoxic respiratory failure secondary to CHF/COPD exacerbation requiring BiPAP and IV Lasix.  Was also found to have elevated LFTs suspected to be hepatic congestion as well as AKI   Subjective  Today thinks her breathing has improved significantly.  Denies any chest pain or abdominal pain.  Does report decreased appetite  A & P  Acute hypoxic respiratory failure, multifactorial etiology including acute exacerbation of CHF and COPD exacerbation, resolved.  Required BiPAP on admission, now has normal oxygen saturation on room air while at rest.  Feels her breathing has significantly improved. - ambulatory O2 testing -DuoNeb scheduled, prednisone, flutter valve, some spirometry   Atrial Fibrillation? Noted on Telemetry while in the ED, no recurrent events . TSH wnl. Currently rate controlled. If confirmed-continue closely monitor  Acute on chronic systolic/diastolic CHF exacerbation in setting of nonadherence to lasix regimen, improving. Mitral valve regurgitation  EF remained stable at 20% and LV demonstrates global hypokinesis on repeat TTE here as well as grade 2 diastolic dysfunction.Presented with BNP greater than 4500, pulmonary edema and possible pleural effusion on chest x-ray that required BiPAP and IV Lasix now significantly  improved.  No lower extremity swelling, however patient states most of her weight tends to build up in her belly , abdomen does not seem distended on my exam.  Hepatic congestion has improved, -3.5 L this hospital stay, but question some inaccuracy in her output, her diuresis been complicated by worsening kidney function though it seems to be improved today based off significant decrease on BMP -Cardiology advises holding off on IV diuresis while awaiting repeat BMP to ensure no lab error -Patient is not amenable to potential transfer to [facility] if warranted because of kidney dysfunction related to IV diuresis -Obtain new weight (Previous dry weight of 136 pounds, 145 admission) - monitor output, daily weights, fluid restrict   COPD exacerbation, improving Continued tobacco use Scant end expiratory wheezing at lung bases, no conversational dyspnea, f.  Patient is still A half pack-a-day smoker -Tobacco cessation counseling provided, patient currently in contemplative state -nicotine patch -Scheduled duo nebs,  prednisone, incentive spirometer, flutter valve -Will need outpatient pulmonary follow-up, has been arranged in the past  Elevated LFTs, likely hepatic congestion in setting of exacerbation of CHF, improving. .  Patient is without abdominal pain.  Hepatitis panel within normal limits -Abdominal ultrasound showed possible CBD dilation, patient without any pain pain in abdomen starts will consider MRCP for further evaluation  AKI,   Suspect this is still prerenal in the setting of CHF exacerbation.  Previous baseline creatinin 0.9-1.  On admission creatinine 1.75, now 2.67 down from 3.35 last 24 hours.  Still making adequate output with no current indication for dialysis -Avoid nephrotoxins -Continue to monitor BMP and output --Holding IV Lasix, will repeat BMP this afternoon to ensure accurate creatinine  Leukocytosis, suspect stress related to CHF/COPD exacerbation.  Has no localizing  symptoms of infection has remained afebrile, likely further exacerbated by steroid therapy -Continue to monitor CBC  Elevated troponin, suspect demand ischemia given no ischemic changes on EKG, no chest pain, likely occurring in setting of CHF/COPD exacerbation -As needed EKG if chest pain  CAD, no chest pain -continue aspirin -has not been taking aspirin     Family Communication  : Daughter updated over phone on 10/14  Code Status : Full code, as discussed on day of admission  Disposition Plan  :  Patient is from home. Anticipated d/c date:  1 to 2 days. Barriers to d/c or necessity for inpatient status:  Need to closely monitor kidney function given persistent AKI, determine need for further IV Lasix versus transition to oral Consults  : Cardiology  Procedures  : TTE, 10/14  DVT Prophylaxis  : Heparin  MDM: The below labs and imaging reports were reviewed and summarized above.  Medication management as above.  Lab Results  Component Value Date   PLT 163 03/25/2020    Diet :  Diet Order            Diet renal/carb modified with fluid restriction Diet-HS Snack? Nothing; Fluid restriction: 1200 mL Fluid; Room service appropriate? Yes; Fluid consistency: Thin  Diet effective now                  Inpatient Medications Scheduled Meds: . aspirin EC  81 mg Oral Daily  . Chlorhexidine Gluconate Cloth  6 each Topical Daily  . heparin  5,000 Units Subcutaneous Q8H  . ipratropium-albuterol  3 mL Nebulization TID  . nicotine  14 mg Transdermal Daily  . predniSONE  40 mg Oral Q breakfast  . sodium chloride flush  3 mL Intravenous Q12H   Continuous Infusions: . sodium chloride     PRN Meds:.sodium chloride, acetaminophen, menthol-cetylpyridinium, ondansetron (ZOFRAN) IV, sodium chloride flush, traMADol  Antibiotics  :   Anti-infectives (From admission, onward)   None       Objective   Vitals:   03/24/20 2052 03/25/20 0509 03/25/20 0725 03/25/20 0915  BP: (!)  101/57 (!) 100/43  98/61  Pulse: 96 80  93  Resp: 20 16  16   Temp: 98 F (36.7 C) 98.2 F (36.8 C)    TempSrc:      SpO2: 99% 98% 96% 96%  Weight:  66.1 kg    Height:        SpO2: 96 % O2 Flow Rate (L/min): 2 L/min FiO2 (%): 60 %  Wt Readings from Last 3 Encounters:  03/25/20 66.1 kg  12/09/19 62.2 kg  10/10/19 61.8 kg     Intake/Output Summary (Last 24 hours) at 03/25/2020 1141 Last data filed at 03/25/2020 0700 Gross per 24 hour  Intake 360 ml  Output 2400 ml  Net -2040 ml    Physical Exam:     Awake Alert, Oriented X 3, Normal affect No new F.N deficits,  Fielding.AT, Normal respiratory effort on room air, and scant wheezing in lung bases, no conversational dyspnea Irregularly irregular rhythm, normal rate,No Gallops,Rubs or new Murmurs, no peripheral edema +ve B.Sounds, Abd Soft, No tenderness, No rebound, guarding or rigidity. No Cyanosis, No new Rash or bruise    I have personally reviewed the following:   Data Reviewed:  CBC Recent Labs  Lab 03/21/20 0954 03/21/20 2114 03/23/20 0913 03/24/20 0534 03/25/20 0747  WBC 16.6* 16.0* 18.3* 11.9* 12.6*  HGB  10.5* 9.6* 9.5* 9.1* 9.3*  HCT 36.9 32.1* 32.6* 29.4* 30.1*  PLT 273 199 200 157 163  MCV 85.8 79.3* 81.3 77.8* 78.2*  MCH 24.4* 23.7* 23.7* 24.1* 24.2*  MCHC 28.5* 29.9* 29.1* 31.0 30.9  RDW 21.7* 21.0* 21.1* 20.9* 20.9*  LYMPHSABS 4.7*  --  2.4 0.9 1.3  MONOABS 1.2*  --  1.0 0.7 0.8  EOSABS 0.0  --  0.0 0.0 0.0  BASOSABS 0.1  --  0.0 0.0 0.0    Chemistries  Recent Labs  Lab 03/21/20 0954 03/21/20 1547 03/22/20 0315 03/23/20 0913 03/24/20 0534 03/25/20 0747  NA 134*  --  132* 131* 131* 134*  K 4.5  --  4.8 4.2 5.2* 4.2  CL 98  --  102 99 98 96*  CO2 12*  --  18* 19* 21* 24  GLUCOSE 126*  --  125* 82 131* 117*  BUN 22  --  42* 71* 78* 82*  CREATININE 1.75*  --  2.49* 3.27* 3.35* 2.67*  CALCIUM 8.5*  --  8.1* 7.9* 8.4* 8.4*  MG  --  2.5*  --   --  2.5* 2.5*  AST 258*  --  519* 166*  100*  --   ALT 144*  --  365* 292* 243*  --   ALKPHOS 100  --  95 83 82  --   BILITOT 0.9  --  0.8 0.7 0.2*  --    ------------------------------------------------------------------------------------------------------------------ No results for input(s): CHOL, HDL, LDLCALC, TRIG, CHOLHDL, LDLDIRECT in the last 72 hours.  Lab Results  Component Value Date   HGBA1C 5.5 10/06/2019   ------------------------------------------------------------------------------------------------------------------ No results for input(s): TSH, T4TOTAL, T3FREE, THYROIDAB in the last 72 hours.  Invalid input(s): FREET3 ------------------------------------------------------------------------------------------------------------------ No results for input(s): VITAMINB12, FOLATE, FERRITIN, TIBC, IRON, RETICCTPCT in the last 72 hours.  Coagulation profile Recent Labs  Lab 03/21/20 1547  INR 1.4*    No results for input(s): DDIMER in the last 72 hours.  Cardiac Enzymes No results for input(s): CKMB, TROPONINI, MYOGLOBIN in the last 168 hours.  Invalid input(s): CK ------------------------------------------------------------------------------------------------------------------    Component Value Date/Time   BNP >4,500.0 (H) 03/21/2020 6333    Micro Results Recent Results (from the past 240 hour(s))  Respiratory Panel by RT PCR (Flu A&B, Covid) - Nasopharyngeal Swab     Status: None   Collection Time: 03/21/20  9:53 AM   Specimen: Nasopharyngeal Swab  Result Value Ref Range Status   SARS Coronavirus 2 by RT PCR NEGATIVE NEGATIVE Final    Comment: (NOTE) SARS-CoV-2 target nucleic acids are NOT DETECTED.  The SARS-CoV-2 RNA is generally detectable in upper respiratoy specimens during the acute phase of infection. The lowest concentration of SARS-CoV-2 viral copies this assay can detect is 131 copies/mL. A negative result does not preclude SARS-Cov-2 infection and should not be used as the sole  basis for treatment or other patient management decisions. A negative result may occur with  improper specimen collection/handling, submission of specimen other than nasopharyngeal swab, presence of viral mutation(s) within the areas targeted by this assay, and inadequate number of viral copies (<131 copies/mL). A negative result must be combined with clinical observations, patient history, and epidemiological information. The expected result is Negative.  Fact Sheet for Patients:  PinkCheek.be  Fact Sheet for Healthcare Providers:  GravelBags.it  This test is no t yet approved or cleared by the [place] FDA and  has been authorized for detection and/or diagnosis of SARS-CoV-2 by FDA under an Emergency  Use Authorization (EUA). This EUA will remain  in effect (meaning this test can be used) for the duration of the COVID-19 declaration under Section 564(b)(1) of the Act, 21 U.S.C. section 360bbb-3(b)(1), unless the authorization is terminated or revoked sooner.     Influenza A by PCR NEGATIVE NEGATIVE Final   Influenza B by PCR NEGATIVE NEGATIVE Final    Comment: (NOTE) The Xpert Xpress SARS-CoV-2/FLU/RSV assay is intended as an aid in  the diagnosis of influenza from Nasopharyngeal swab specimens and  should not be used as a sole basis for treatment. Nasal washings and  aspirates are unacceptable for Xpert Xpress SARS-CoV-2/FLU/RSV  testing.  Fact Sheet for Patients: PinkCheek.be  Fact Sheet for Healthcare Providers: GravelBags.it  This test is not yet approved or cleared by the [place] FDA and  has been authorized for detection and/or diagnosis of SARS-CoV-2 by  FDA under an Emergency Use Authorization (EUA). This EUA will remain  in effect (meaning this test can be used) for the duration of the  Covid-19 declaration under Section 564(b)(1) of the  Act, 21  U.S.C. section 360bbb-3(b)(1), unless the authorization is  terminated or revoked. Performed at [facility], [address] [place], [place] [address]     Radiology Reports DG Chest Portable 1 View  Result Date: 03/21/2020 CLINICAL DATA:  Shortness of breath, increased shortness of breath with COPD EXAM: PORTABLE CHEST 1 VIEW COMPARISON:  December 07, 2019 FINDINGS: Trachea midline. Cardiomediastinal contours remain markedly enlarged. Configuration of the heart raising the question of pericardial effusion though not significantly changed from prior study. Aortic atherosclerosis in the aortic arch. Partially obscured LEFT hemidiaphragm. Increased interstitial markings without lobar consolidation. Limited assessment of skeletal structures without acute process. IMPRESSION: 1. Marked enlargement of the cardiomediastinal contours with findings of pulmonary edema and possible LEFT effusion. 2. Partially obscured LEFT hemidiaphragm may represent atelectasis or developing infiltrate. 3. Cardiac configuration raising the question of pericardial effusion though not significantly changed when compared to the previous exam. Electronically Signed   By: Zetta Bills M.D.   On: 03/21/2020 10:27   ECHOCARDIOGRAM COMPLETE  Result Date: 03/21/2020    ECHOCARDIOGRAM REPORT   Patient Name:   [name] Date of Exam: 03/21/2020 Medical Rec #:  [id number]        Height:       66.5 in Accession #:    [id number]       Weight:       137.1 lb Date of Birth:  [birth year]        BSA:          1.713 m Patient Age:    82 years         BP:           106/74 mmHg Patient Gender: F                HR:           92 bpm. Exam Location:  [facility] Procedure: Cardiac Doppler, Color Doppler and 2D Echo Indications:    Acute systolic chf. Cxr mentions concern for pericardial                 effusion  History:        Patient has prior history of Echocardiogram examinations, most                 recent 12/27/2019. COPD; Risk  Factors:Dyslipidemia and Current  Smoker. Acute respiratory failure, MITRAL REGURGITATION.  Sonographer:    Leavy Cella RDCS (AE) Referring Phys: [id number] [place]  1. Images are limited.  2. Left ventricular ejection fraction, by estimation, is approximately 20%. The left ventricle has severely decreased function. The left ventricle demonstrates global hypokinesis with some regional variation. Left ventricular diastolic parameters are consistent with Grade II diastolic dysfunction (pseudonormalization).  3. Right ventricular systolic function is moderately reduced. The right ventricular size is normal. There is moderately elevated pulmonary artery systolic pressure. The estimated right ventricular systolic pressure is 30.1 mmHg.  4. Left atrial size was mild to moderately dilated.  5. Right atrial size was mildly dilated.  6. The mitral valve is abnormal. Moderate to severe mitral valve regurgitation. Moderate mitral annular calcification.  7. The aortic valve is tricuspid. There is mild calcification of the aortic valve. Aortic valve regurgitation is mild.  8. The inferior vena cava is dilated in size with >50% respiratory variability, suggesting right atrial pressure of 8 mmHg. FINDINGS  Left Ventricle: Left ventricular ejection fraction, by estimation, is 20%. The left ventricle has severely decreased function. The left ventricle demonstrates global hypokinesis. The left ventricular internal cavity size was normal in size. There is no left ventricular hypertrophy. Abnormal (paradoxical) septal motion, consistent with left bundle branch block. Left ventricular diastolic parameters are consistent with Grade II diastolic dysfunction (pseudonormalization). Right Ventricle: The right ventricular size is normal. No increase in right ventricular wall thickness. Right ventricular systolic function is moderately reduced. There is moderately elevated pulmonary artery systolic pressure.  The tricuspid regurgitant velocity is 3.27 m/s, and with an assumed right atrial pressure of 8 mmHg, the estimated right ventricular systolic pressure is 60.1 mmHg. Left Atrium: Left atrial size was mild to moderately dilated. Right Atrium: Right atrial size was mildly dilated. Pericardium: There is no evidence of pericardial effusion. Mitral Valve: The mitral valve is abnormal. Moderate mitral annular calcification. Moderate to severe mitral valve regurgitation. Tricuspid Valve: The tricuspid valve is grossly normal. Tricuspid valve regurgitation is mild. Aortic Valve: The aortic valve is tricuspid. There is mild calcification of the aortic valve. Aortic valve regurgitation is mild. Pulmonic Valve: The pulmonic valve was not well visualized. Pulmonic valve regurgitation is trivial. Aorta: The aortic root is normal in size and structure. Venous: The inferior vena cava is dilated in size with greater than 50% respiratory variability, suggesting right atrial pressure of 8 mmHg. IAS/Shunts: No atrial level shunt detected by color flow Doppler.  LEFT VENTRICLE PLAX 2D LVIDd:         5.07 cm  Diastology LVIDs:         4.40 cm  LV e' medial:    4.68 cm/s LV PW:         1.25 cm  LV E/e' medial:  20.8 LV IVS:        1.31 cm  LV e' lateral:   7.88 cm/s LVOT diam:     1.90 cm  LV E/e' lateral: 12.3 LVOT Area:     2.84 cm  RIGHT VENTRICLE RV S prime:     11.50 cm/s LEFT ATRIUM             Index       RIGHT ATRIUM           Index LA diam:        3.70 cm 2.16 cm/m  RA Area:     21.00 cm LA Vol (A2C):   96.8 ml 56.51 ml/m  RA Volume:   64.00 ml  37.36 ml/m LA Vol (A4C):   47.1 ml 27.50 ml/m LA Biplane Vol: 70.0 ml 40.87 ml/m   AORTA Ao Root diam: 2.70 cm MITRAL VALVE                 TRICUSPID VALVE MV Area (PHT): 5.34 cm      TR Peak grad:   42.8 mmHg MV Decel Time: 142 msec      TR Vmax:        327.00 cm/s MR Peak grad:    101.6 mmHg MR Mean grad:    60.0 mmHg   SHUNTS MR Vmax:         504.00 cm/s Systemic Diam: 1.90 cm  MR Vmean:        357.0 cm/s MR PISA:         3.08 cm MR PISA Eff ROA: 19 mm MR PISA Radius:  0.70 cm MV E velocity: 97.30 cm/s MV A velocity: 52.30 cm/s MV E/A ratio:  1.86 Rozann Lesches MD Electronically signed by Rozann Lesches MD Signature Date/Time: 03/21/2020/4:39:46 PM    Final    US Abdomen Limited RUQ  Result Date: 03/21/2020 CLINICAL DATA:  Increased LFTs. EXAM: ULTRASOUND ABDOMEN LIMITED RIGHT UPPER QUADRANT COMPARISON:  None. FINDINGS: Gallbladder: Status post cholecystectomy. Common bile duct: Diameter: 11 mm, which is dilated. Liver: No focal lesion identified, although evaluation is limited secondary to poor sonographic window. Mildly increased echogenicity of the liver diffusely. Portal vein is patent on color Doppler imaging with normal direction of blood flow towards the liver. Other: Right pleural effusion. IMPRESSION: 1. The proximal common bile duct is dilated, measuring 11 mm. No evidence of proximal obstructing lesion; however, the distal common bile duct is not well evaluated sonographically. If there is concern for obstruction, MRI/MRCP or CT could further evaluate. 2. Mildly increased echogenicity of the liver diffusely, which may relate to hepatic steatosis or chronic hepatic disease. 3. Right pleural effusion. 4. Status post cholecystectomy. Electronically Signed   By: Margaretha Sheffield MD   On: 03/21/2020 13:27     Time Spent in minutes  30     Desiree Hane M.D on 03/25/2020 at 11:41 AM  To page go to [email or web address] - password [facility]

## 2020-03-25 NOTE — Progress Notes (Signed)
Progress Note  Patient Name: [name] Date of Encounter: 03/25/2020  [facility] Cardiologist: Carlyle Dolly, MD   Subjective   SOB improving.   Inpatient Medications    Scheduled Meds: . aspirin EC  81 mg Oral Daily  . Chlorhexidine Gluconate Cloth  6 each Topical Daily  . furosemide  40 mg Intravenous BID  . heparin  5,000 Units Subcutaneous Q8H  . ipratropium-albuterol  3 mL Nebulization TID  . nicotine  14 mg Transdermal Daily  . predniSONE  40 mg Oral Q breakfast  . sodium chloride flush  3 mL Intravenous Q12H   Continuous Infusions: . sodium chloride     PRN Meds: sodium chloride, acetaminophen, menthol-cetylpyridinium, ondansetron (ZOFRAN) IV, sodium chloride flush, traMADol   Vital Signs    Vitals:   03/24/20 2052 03/25/20 0509 03/25/20 0725 03/25/20 0915  BP: (!) 101/57 (!) 100/43  98/61  Pulse: 96 80  93  Resp: 20 16  16   Temp: 98 F (36.7 C) 98.2 F (36.8 C)    TempSrc:      SpO2: 99% 98% 96% 96%  Weight:  66.1 kg    Height:        Intake/Output Summary (Last 24 hours) at 03/25/2020 1024 Last data filed at 03/25/2020 0700 Gross per 24 hour  Intake 360 ml  Output 2400 ml  Net -2040 ml   Last 3 Weights 03/25/2020 03/24/2020 03/23/2020  Weight (lbs) 145 lb 11.6 oz 145 lb 15.1 oz 145 lb 8.1 oz  Weight (kg) 66.1 kg 66.2 kg 66 kg      Telemetry    SR - Personally Reviewed  ECG    n/a - Personally Reviewed  Physical Exam   GEN: No acute distress.   Neck: No JVD Cardiac: RRR, no murmurs, rubs, or gallops.  Respiratory: Clear to auscultation bilaterally. GI: Soft, nontender, non-distended  MS: No edema; No deformity. Neuro:  Nonfocal  Psych: Normal affect   Labs    High Sensitivity Troponin:   Recent Labs  Lab 03/21/20 0954 03/21/20 1149  TROPONINIHS 81* 90*      Chemistry Recent Labs  Lab 03/22/20 0315 03/22/20 0315 03/23/20 0913 03/24/20 0534 03/25/20 0747  NA 132*   < > 131* 131* 134*  K 4.8   < >  4.2 5.2* 4.2  CL 102   < > 99 98 96*  CO2 18*   < > 19* 21* 24  GLUCOSE 125*   < > 82 131* 117*  BUN 42*   < > 71* 78* 82*  CREATININE 2.49*   < > 3.27* 3.35* 2.67*  CALCIUM 8.1*   < > 7.9* 8.4* 8.4*  PROT 6.5  --  6.2* 6.4*  --   ALBUMIN 3.4*  --  3.4* 3.4*  --   AST 519*  --  166* 100*  --   ALT 365*  --  292* 243*  --   ALKPHOS 95  --  83 82  --   BILITOT 0.8  --  0.7 0.2*  --   GFRNONAA 18*   < > 13* 13* 17*  ANIONGAP 12   < > 13 12 14    < > = values in this interval not displayed.     Hematology Recent Labs  Lab 03/23/20 0913 03/24/20 0534 03/25/20 0747  WBC 18.3* 11.9* 12.6*  RBC 4.01 3.78* 3.85*  HGB 9.5* 9.1* 9.3*  HCT 32.6* 29.4* 30.1*  MCV 81.3 77.8* 78.2*  MCH 23.7* 24.1*  24.2*  MCHC 29.1* 31.0 30.9  RDW 21.1* 20.9* 20.9*  PLT 200 157 163    BNP Recent Labs  Lab 03/21/20 0954  BNP >4,500.0*     DDimer No results for input(s): DDIMER in the last 168 hours.   Radiology    No results found.  Cardiac Studies    Patient Profile    [name] is a 74 y.o. female with past medical history of chronic systolic CHF/NICM (EF 37% by echocardiogram in 09/2019), CAD (s/p BMS to RCA in 2010, cath in 10/2019 showing mild to moderate disease and patent RCA stent with minimal ISR with reduction in LV function out of proportion to the degree of CAD), HTN, HLD, carotid artery stenosis (s/p L CEA in 2010), moderate to severe MR, COPD and history of breast cancer who is being seen today for the evaluation of CHF at the request of Dr. Lonny Prude.   Assessment & Plan      1. Acute on chronic systolic HF -chronic sysotlic HF LVEF 85% diagonsed 09/2019 - 10/2019 cath mild to mod CAD. CI 3.35, mean PA 20, PW 15 -Dry weight during 10/2019 admission when filling pressures were normal by cath was 136 lbs. Admit weight 145 lbs  -03/2020 echo LVEF 20%, grade II DDx,moderate RV dysfunction, mod to severe MR - admit BNP >4500 (12/2019 2850). CXR pulm edema. AST 258 ALT  144.  Admitted severe SOB, was initially on bipap - has had issues with medication compliance at home, financial difficultly getting meds during prior admission, this time she reports he doctor would not refill her meds and off x 3 weeks.   Neg 2.4 L yesterday, neg 3.7 L since admission.  -diuresis has been complicated by renal dysfunction, soft bp's. I think her sudden drop in Cr today may be a lab error and will repeat. She is on IV lasix 40mg  bid.  - home HF meds held due to soft bp's.  - significant improvement in symtoms with diuresis, initially on bipap now on room air. Data shows weights havent changed which is hard to believe, I/Os are incomplete. Does not appear significantly volume overloaded by exam. Order a standing weight today. Hold IV lasix for now.   - f/u labs, may transition to oral diuretic today. She strongly wants to go home tomorrow, would not be open to a transfer to [facility] this admission.     2.AKI - 12/2019 Cr 0.92 - admit Cr 1.75, up to 3.35 this admit. I suspect the drop in Cr this AM is an error and will repeat    For questions or updates, please contact [place] Please consult [email or web address] for contact info under        Signed, Carlyle Dolly, MD  03/25/2020, 10:24 AM

## 2020-03-25 NOTE — TOC Initial Note (Signed)
Transition of Care [facility]) - Initial/Assessment Note    Patient Details  Name: [name] MRN: [id number] Date of Birth: 04/14/1946  Transition of Care [facility] CM/SW Contact:    [name], [place] Phone Number: 03/25/2020, 8:48 AM  Clinical Narrative:  Pt admitted due to acute hypoxic respiratory failure secondary to exacerbation of CHF. TOC consulted for CHF screening and high risk readmission score. LCSW completed assessment with pt's daughter, [name]. [name] and grandson live with pt and are with pt almost all the time. She requires some assist with ADLs. Pt plans to return home when medically stable. PT evaluated pt and no follow up needed. LCSW completed CHF assessment. [name] states pt was doing daily weights until she started feeling poorly prior to hospital stay. She indicates she does "so-so" with following a heart healthy diet, but they have been educated on this. Pt is currently working on getting cardiologist in [place] where pt has all of her doctors. Per [name], pt takes medications as prescribed. TOC to continue to follow.                  Expected Discharge Plan: Home/Self Care Barriers to Discharge: Continued Medical Work up   Patient Goals and CMS Choice Patient states their goals for this hospitalization and ongoing recovery are:: return home   Choice offered to / list presented to : Adult Children  Expected Discharge Plan and Services Expected Discharge Plan: Home/Self Care In-house Referral: Clinical Social Work   Post Acute Care Choice: NA Living arrangements for the past 2 months: Single Family Home                   DME Agency: NA         HH Agency: NA        Prior Living Arrangements/Services Living arrangements for the past 2 months: [place] Lives with:: Relatives Patient language and need for interpreter reviewed:: Yes Do you feel safe going back to the place where you live?: Yes      Need for Family  Participation in Patient Care: Yes (Comment) Care giver support system in place?: Yes (comment)   Criminal Activity/Legal Involvement Pertinent to Current Situation/Hospitalization: No - Comment as needed  Activities of Daily Living Home Assistive Devices/Equipment: None ADL Screening (condition at time of admission) Patient's cognitive ability adequate to safely complete daily activities?: Yes Is the patient deaf or have difficulty hearing?: Yes Does the patient have difficulty seeing, even when wearing glasses/contacts?: No Does the patient have difficulty concentrating, remembering, or making decisions?: No Patient able to express need for assistance with ADLs?: Yes Does the patient have difficulty dressing or bathing?: No Independently performs ADLs?: Yes (appropriate for developmental age) Does the patient have difficulty walking or climbing stairs?: No Weakness of Legs: None Weakness of Arms/Hands: None  Permission Sought/Granted                  Emotional Assessment         Alcohol / Substance Use: Not Applicable Psych Involvement: No (comment)  Admission diagnosis:  Elevated LFTs [R79.89] Respiratory failure with hypoxia (HCC) [J96.91] Acute respiratory failure with hypoxia ([place]) [J96.01] Patient Active Problem List   Diagnosis Date Noted  . Respiratory failure with hypoxia ([place]) 03/22/2020  . Elevated LFTs 03/21/2020  . AKI (acute kidney injury) ([place]) 03/21/2020  . Leukocytosis 03/21/2020  . Acute respiratory failure with hypoxia ([place]) 03/21/2020  . Hypoxia   . COPD exacerbation ([place])  10/06/2019  . Acute systolic CHF (congestive heart failure) ([place]) 10/06/2019  . Acute systolic congestive heart failure ([place])   . COPD (chronic obstructive pulmonary disease) ([place])   . Mitral regurgitation 03/26/2009  . Coronary atherosclerosis 03/26/2009  . Bilateral carotid bruits 03/26/2009  . Tobacco abuse 02/23/2009  . DEPRESSION 09/04/2007  . Acute on chronic  combined systolic and diastolic CHF (congestive heart failure) ([place]) 09/04/2007  . GERD 09/04/2007  . OSTEOARTHRITIS 09/04/2007  . LOW BACK PAIN 09/04/2007  . Hyperlipidemia 09/02/2007  . Hypertension 09/02/2007  . Chronic diastolic heart failure ([place]) 09/02/2007  . COPD with chronic bronchitis ([place]) 09/02/2007  . URINARY INCONTINENCE, URGE 09/02/2007   PCP:  Patient, No Pcp Per Pharmacy:   [facility] [address], [place] [address] Phone: [phone] Fax: [place], [place] [address] [place] [address] Phone: [phone] Fax: [phone]     Social Determinants of Health (SDOH) Interventions    Readmission Risk Interventions Readmission Risk Prevention Plan 03/25/2020  Transportation Screening Complete  HRI or [place] Complete  Social Work Consult for [place] Planning/Counseling Complete  Palliative Care Screening Not Applicable  Medication Review Press photographer) Complete  Some recent data might be hidden

## 2020-03-25 NOTE — Progress Notes (Signed)
Repeat labs show Cr truly has trended down, LFTs improving, BNP trending down. Hold further diuretics today, follow up labs tomorrow, likely start oral diuretic tomorrow   Carlyle Dolly MD

## 2020-03-26 ENCOUNTER — Inpatient Hospital Stay (HOSPITAL_COMMUNITY): Payer: Medicare Other

## 2020-03-26 DIAGNOSIS — I5043 Acute on chronic combined systolic (congestive) and diastolic (congestive) heart failure: Secondary | ICD-10-CM | POA: Diagnosis not present

## 2020-03-26 DIAGNOSIS — R062 Wheezing: Secondary | ICD-10-CM

## 2020-03-26 LAB — BASIC METABOLIC PANEL
Anion gap: 13 (ref 5–15)
BUN: 81 mg/dL — ABNORMAL HIGH (ref 8–23)
CO2: 24 mmol/L (ref 22–32)
Calcium: 8.6 mg/dL — ABNORMAL LOW (ref 8.9–10.3)
Chloride: 97 mmol/L — ABNORMAL LOW (ref 98–111)
Creatinine, Ser: 2.5 mg/dL — ABNORMAL HIGH (ref 0.44–1.00)
GFR, Estimated: 18 mL/min — ABNORMAL LOW (ref >60)
Glucose, Bld: 133 mg/dL — ABNORMAL HIGH (ref 70–99)
Potassium: 4 mmol/L (ref 3.5–5.1)
Sodium: 134 mmol/L — ABNORMAL LOW (ref 135–145)

## 2020-03-26 LAB — MAGNESIUM: Magnesium: 2.5 mg/dL — ABNORMAL HIGH (ref 1.7–2.4)

## 2020-03-26 MED ORDER — GUAIFENESIN ER 600 MG PO TB12
600.0000 mg | ORAL_TABLET | Freq: Two times a day (BID) | ORAL | Status: DC
  Administered 2020-03-26 – 2020-03-27 (×4): 600 mg via ORAL
  Filled 2020-03-26 (×5): qty 1

## 2020-03-26 MED ORDER — ALBUTEROL SULFATE (2.5 MG/3ML) 0.083% IN NEBU
2.5000 mg | INHALATION_SOLUTION | RESPIRATORY_TRACT | Status: DC | PRN
  Administered 2020-03-27 – 2020-03-28 (×3): 2.5 mg via RESPIRATORY_TRACT
  Filled 2020-03-26 (×3): qty 3

## 2020-03-26 MED ORDER — LEVOFLOXACIN IN D5W 750 MG/150ML IV SOLN
750.0000 mg | Freq: Once | INTRAVENOUS | Status: AC
  Administered 2020-03-26: 750 mg via INTRAVENOUS
  Filled 2020-03-26: qty 150

## 2020-03-26 MED ORDER — FUROSEMIDE 40 MG PO TABS
40.0000 mg | ORAL_TABLET | Freq: Two times a day (BID) | ORAL | Status: DC
  Administered 2020-03-26 – 2020-03-28 (×5): 40 mg via ORAL
  Filled 2020-03-26 (×5): qty 1

## 2020-03-26 MED ORDER — IPRATROPIUM-ALBUTEROL 0.5-2.5 (3) MG/3ML IN SOLN
3.0000 mL | Freq: Four times a day (QID) | RESPIRATORY_TRACT | Status: DC
  Administered 2020-03-26 (×2): 3 mL via RESPIRATORY_TRACT
  Filled 2020-03-26 (×2): qty 3

## 2020-03-26 MED ORDER — LEVOFLOXACIN IN D5W 500 MG/100ML IV SOLN: 500.0000 mg | INTRAVENOUS | Status: DC

## 2020-03-26 MED ORDER — IPRATROPIUM-ALBUTEROL 0.5-2.5 (3) MG/3ML IN SOLN: 3.0000 mL | Freq: Four times a day (QID) | RESPIRATORY_TRACT | Status: DC | PRN

## 2020-03-26 MED ORDER — IPRATROPIUM-ALBUTEROL 0.5-2.5 (3) MG/3ML IN SOLN
3.0000 mL | Freq: Three times a day (TID) | RESPIRATORY_TRACT | Status: DC
  Administered 2020-03-27 – 2020-03-28 (×5): 3 mL via RESPIRATORY_TRACT
  Filled 2020-03-26 (×5): qty 3

## 2020-03-26 NOTE — Progress Notes (Signed)
Progress Note  Patient Name: [name] Date of Encounter: 03/26/2020  [facility] Cardiologist: Carlyle Dolly, MD   Subjective   Episode of SOB early this AM.   Inpatient Medications    Scheduled Meds: . aspirin EC  81 mg Oral Daily  . Chlorhexidine Gluconate Cloth  6 each Topical Daily  . heparin  5,000 Units Subcutaneous Q8H  . nicotine  14 mg Transdermal Daily  . predniSONE  40 mg Oral Q breakfast  . sodium chloride flush  3 mL Intravenous Q12H   Continuous Infusions: . sodium chloride     PRN Meds: sodium chloride, acetaminophen, ipratropium-albuterol, menthol-cetylpyridinium, ondansetron (ZOFRAN) IV, sodium chloride flush, traMADol   Vital Signs    Vitals:   03/25/20 1857 03/25/20 2145 03/26/20 0447 03/26/20 0600  BP:  (!) 95/51  103/60  Pulse:  72  88  Resp:  18  18  Temp:  97.9 F (36.6 C)  98.1 F (36.7 C)  TempSrc:      SpO2: 95% 99% 100% 98%  Weight:      Height:        Intake/Output Summary (Last 24 hours) at 03/26/2020 1000 Last data filed at 03/26/2020 0948 Gross per 24 hour  Intake 360 ml  Output 1675 ml  Net -1315 ml   Last 3 Weights 03/25/2020 03/25/2020 03/24/2020  Weight (lbs) 146 lb 13.2 oz 145 lb 11.6 oz 145 lb 15.1 oz  Weight (kg) 66.6 kg 66.1 kg 66.2 kg      Telemetry    SR - Personally Reviewed  ECG    SR - Personally Reviewed  Physical Exam   GEN: No acute distress.   Neck: mildlly elevated JVD Cardiac: RRR, no murmurs, rubs, or gallops.  Respiratory: coarse breaths sounds bilaterally GI: Soft, nontender, non-distended  MS: No edema; No deformity. Neuro:  Nonfocal  Psych: Normal affect   Labs    High Sensitivity Troponin:   Recent Labs  Lab 03/21/20 0954 03/21/20 1149  TROPONINIHS 81* 90*      Chemistry Recent Labs  Lab 03/23/20 0913 03/23/20 0913 03/24/20 0534 03/24/20 0534 03/25/20 0747 03/25/20 1129 03/26/20 0636  NA 131*   < > 131*   < > 134* 135 134*  K 4.2   < > 5.2*   < > 4.2  4.1 4.0  CL 99   < > 98   < > 96* 95* 97*  CO2 19*   < > 21*   < > 24 24 24   GLUCOSE 82   < > 131*   < > 117* 105* 133*  BUN 71*   < > 78*   < > 82* 81* 81*  CREATININE 3.27*   < > 3.35*   < > 2.67* 2.67* 2.50*  CALCIUM 7.9*   < > 8.4*   < > 8.4* 8.6* 8.6*  PROT 6.2*  --  6.4*  --   --  6.6  --   ALBUMIN 3.4*  --  3.4*  --   --  3.6  --   AST 166*  --  100*  --   --  66*  --   ALT 292*  --  243*  --   --  191*  --   ALKPHOS 83  --  82  --   --  83  --   BILITOT 0.7  --  0.2*  --   --  0.8  --   GFRNONAA 13*   < >  13*   < > 17* 17* 18*  ANIONGAP 13   < > 12   < > 14 16* 13   < > = values in this interval not displayed.     Hematology Recent Labs  Lab 03/23/20 0913 03/24/20 0534 03/25/20 0747  WBC 18.3* 11.9* 12.6*  RBC 4.01 3.78* 3.85*  HGB 9.5* 9.1* 9.3*  HCT 32.6* 29.4* 30.1*  MCV 81.3 77.8* 78.2*  MCH 23.7* 24.1* 24.2*  MCHC 29.1* 31.0 30.9  RDW 21.1* 20.9* 20.9*  PLT 200 157 163    BNP Recent Labs  Lab 03/21/20 0954 03/25/20 1129  BNP >4,500.0* 2,397.0*     DDimer No results for input(s): DDIMER in the last 168 hours.   Radiology    DG CHEST PORT 1 VIEW  Result Date: 03/26/2020 CLINICAL DATA:  Shortness of breath and wheezing. Cough. History of left mastectomy. EXAM: PORTABLE CHEST 1 VIEW COMPARISON:  03/21/2020 12/07/2019.  12/04/2019. FINDINGS: Mediastinum hilar structures are stable. Stable severe cardiomegaly no pulmonary venous congestion bilateral peribronchial cuffing noted, bronchitis cannot be excluded. Mild bibasilar atelectasis and or scarring again noted. Stable biapical scratched it stable basilar pleural thickening consistent with scarring. No pleural effusion or pneumothorax. Left mastectomy. No acute bony abnormality. Thoracic spine scoliosis and degenerative change again noted. IMPRESSION: 1. Peribronchial cuffing noted bilaterally, bronchitis cannot be excluded. 2. Mild bibasilar atelectasis and or scarring again noted. No interim change. No  acute infiltrate noted. 3. Stable severe cardiomegaly.  No pulmonary venous congestion. Electronically Signed   By: Marcello Moores  Register   On: 03/26/2020 05:47    Cardiac Studies    Patient Profile     [name] a 74 y.o.femalewith past medical history of chronic systolic CHF/NICM(EF 84% by echocardiogram in 09/2019), CAD (s/p BMS to RCA in 2010, cath in 10/2019 showing mild to moderate disease and patent RCA stent with minimal ISRwith reduction in LV function out of proportion to the degree of CAD), HTN, HLD, carotid artery stenosis (s/p L CEA in 2010), moderate to severe MR, COPD and history of breast cancerwho is being seen today for the evaluation of CHFat the request of Dr. Lonny Prude.   Assessment & Plan    1. Acute on chronic systolic HF -chronic sysotlic HF LVEF 13% diagonsed 09/2019 - 10/2019 cath mild to mod CAD. CI 3.35, mean PA 20, PW 15 -Dry weight during 10/2019 admission when filling pressures were normal by cath was 136 lbs. Admit weight 145 lbs  -03/2020 echo LVEF 20%, grade II DDx,moderate RV dysfunction, mod to severe MR - admit BNP >4500 (12/2019 2850). CXR pulm edema. AST 258 ALT 144.  Admitted severe SOB, was initially on bipap - has had issues with medication compliance at home, financial difficultly getting meds during prior admission, this time she reports he doctor would not refill her meds and off x 3 weeks.   Incomplete I/Os this admission. Standing weight yesterday 146 lbs - clearly symptomatically improved with diuresis as was initially on bipap, now just 2L [place] - significant AKI with diuresis that is now trending down. Start lasix oral 40mg  bid.  - home HF meds held due to soft bp's. She stopped her home diuretic, was not treatment failure. -she is resistant to transfer to [facility]  - start oral lasix 40mg  bid and follow diuresis and renal function.    2.AKI - 12/2019 Cr 0.92 - admit Cr 1.75, up to 3.35 this admit. I suspect the drop in Cr  this AM  is an error and will repeat  3. COPD exacerbation - per primary team. - ongoing thick mucous/productive cough.   For questions or updates, please contact [place] Please consult [email or web address] for contact info under        Signed, Carlyle Dolly, MD  03/26/2020, 10:00 AM

## 2020-03-26 NOTE — Progress Notes (Signed)
Patient woke up with  Severe SOB and wheezing. Patient placed on O2 at 2L, Saturation at 100%. Will wean off O2 when Patient stabilizes. Respiratory with Patient administering neb treatment. MD on call notified ( see orders).

## 2020-03-26 NOTE — Progress Notes (Signed)
Pharmacy Antibiotic Note  [name] is a 74 y.o. female admitted on 03/21/2020 with COPD exacerbation.  Pharmacy has been consulted for Levaquin dosing.  Plan: Levaquin 750 mg IV x 1 dose followed by 500 mg every 48 hours. Monitor labs, c/s, and patient improvement.  Height: 5\' 6"  (167.6 cm) Weight: 66.6 kg (146 lb 13.2 oz) IBW/kg (Calculated) : 59.3  Temp (24hrs), Avg:98 F (36.7 C), Min:97.9 F (36.6 C), Max:98.1 F (36.7 C)  Recent Labs  Lab 03/21/20 0954 03/21/20 2114 03/22/20 0315 03/23/20 0913 03/24/20 0534 03/25/20 0747 03/25/20 1129 03/26/20 0636  WBC 16.6* 16.0*  --  18.3* 11.9* 12.6*  --   --   CREATININE 1.75*  --    < > 3.27* 3.35* 2.67* 2.67* 2.50*   < > = values in this interval not displayed.    Estimated Creatinine Clearance: 17.9 mL/min (A) (by C-G formula based on SCr of 2.5 mg/dL (H)).    Allergies  Allergen Reactions  . Gabapentin Swelling    Antimicrobials this admission: Levaquin 10/19 >>      Thank you for allowing pharmacy to be a part of this patient's care.  Ramond Craver 03/26/2020 3:03 PM

## 2020-03-26 NOTE — Progress Notes (Signed)
Physical Therapy Treatment Patient Details Name: [name] MRN: [id number] DOB: February 22, 1946 Today's Date: 03/26/2020    History of Present Illness [name] is a 74 year old female with medical history significant for chronic systolic CHF with EF of 40% (TTE on 12/2019), HTN, HLD, COPD who presented to the ED on 10/14 with complaints of worsening shortness of breath over several weeks but particular over the last few days.  Patient unable to provide much history as she is currently receiving BiPAP mask during examination and review of chart and discussing with the ED provider patient's daughter called EMS for shortness of breath.  At that time EMS placed patient on CPAP with good response and noted oxygen saturation in the 90s.    PT Comments    Pt with SOB episode earlier this morning. Session limited to bed exercise due to pt reporting "I'm too anxious" when therapist educated and encouraged pt to perform transfer training and ambulation. Pt able to complete exercise with mild SOB, on 2L O2 with SpO2 95-97%. Pt educated on therapeutic process and encouraged to engage in OOB activity with nursing when anxiousness decreases. Patient will benefit from continued physical therapy in hospital and recommendations below to increase strength, balance, endurance for safe ADLs and gait.    Follow Up Recommendations  No PT follow up     Equipment Recommendations  None recommended by PT    Recommendations for Other Services       Precautions / Restrictions Precautions Precautions: Fall Restrictions Weight Bearing Restrictions: No    Mobility  Bed Mobility  General bed mobility comments: pt declines mobility due to "I'm too anxious"  Transfers  General transfer comment: pt declines mobility due to "I'm too anxious"  Ambulation/Gait      Stairs             Wheelchair Mobility    Modified Rankin (Stroke Patients Only)       Balance        Cognition  Arousal/Alertness: Awake/alert Behavior During Therapy: WFL for tasks assessed/performed;Anxious Overall Cognitive Status: Within Functional Limits for tasks assessed   General Comments: Pt reports "I'm too anxious" when therapist attempts transfer training and gait training due to SOB earlier today      Exercises General Exercises - Lower Extremity Ankle Circles/Pumps: AROM;Strengthening;Both;10 reps;Supine Short Arc Quad: AROM;Strengthening;Both;10 reps;Supine Heel Slides: AROM;Strengthening;Both;10 reps;Supine Straight Leg Raises: AROM;Strengthening;Both;10 reps;Supine    General Comments General comments (skin integrity, edema, etc.): Pt on 2L with SpO2 95-97% with supine exercises      Pertinent Vitals/Pain Pain Assessment: No/denies pain    Home Living                      Prior Function            PT Goals (current goals can now be found in the care plan section) Acute Rehab PT Goals Patient Stated Goal: Return home PT Goal Formulation: With patient Time For Goal Achievement: 04/05/20 Potential to Achieve Goals: Good Progress towards PT goals: Not progressing toward goals - comment (anxiety towards mobilizing, limited to bed exercise this session)    Frequency    Min 3X/week      PT Plan Current plan remains appropriate    Co-evaluation              AM-PAC PT "6 Clicks" Mobility   Outcome Measure  Help needed turning from your back to your side while in a flat bed without using  bedrails?: None Help needed moving from lying on your back to sitting on the side of a flat bed without using bedrails?: None Help needed moving to and from a bed to a chair (including a wheelchair)?: None Help needed standing up from a chair using your arms (e.g., wheelchair or bedside chair)?: None Help needed to walk in hospital room?: A Little Help needed climbing 3-5 steps with a railing? : A Little 6 Click Score: 22    End of Session Equipment Utilized  During Treatment: Oxygen Activity Tolerance: Patient tolerated treatment well Patient left: in bed;with call bell/phone within reach;with bed alarm set Nurse Communication: Mobility status PT Visit Diagnosis: Other abnormalities of gait and mobility (R26.89);Muscle weakness (generalized) (M62.81)     Time: 1552-0802 PT Time Calculation (min) (ACUTE ONLY): 9 min  Charges:  $Therapeutic Exercise: 8-22 mins                      Tori Christa Fasig PT, DPT 03/26/20, 1:51 PM [phone]

## 2020-03-26 NOTE — Progress Notes (Signed)
[facility]  PROGRESS NOTE  [name] JEH:[id number] DOB: 1945-11-17 DOA: 03/21/2020 PCP: Patient, No Pcp Per Admit date - 03/21/2020   Admitting Physician Rolla Plate, DO  Outpatient Primary MD for the patient is Patient, No Pcp Per  LOS - 5 Brief Narrative  [name] is a 74 year old female with medical history significant for chronic systolic CHF with EF of 37% (TTE on 12/2019), HTN, HLD, COPD who presented to the ED on 10/14 with complaints of worsening shortness of breath over several weeks but particular over the last few days in setting of poor adherence to her cardiac medications per her daughter and was found to have acute hypoxic respiratory failure secondary to CHF/COPD exacerbation requiring BiPAP and IV Lasix.  Was also found to have elevated LFTs suspected to be hepatic congestion as well as AKI   Subjective  Had episode of worsening cough or shortness of breath requiring 2 L O2 this morning.  Chest x-ray likely bronchitis with no signs of pulmonary edema  A & P  Acute hypoxic respiratory failure, multifactorial etiology including acute exacerbation of CHF and COPD exacerbation, recurrent.  Required BiPAP on admission and with IV diuresis was able to wean to room air until this morning with episode of hypoxia and worsening wheezing.  Normal respiratory effort on 2 L but still has persistent diffuse wheezing in all lung fields, with productive cough.  Chest x-ray shows no pulmonary edema but consistent with bronchitis -From a volume standpoint patient seems to be approaching euvolemia will discontinue IV diuresis for oral 6 -Given persistent wheezing will restart DuoNeb scheduled, add IV Levaquin, continue prednisone taper, if persists may warrant IV Solu-Medrol -Also supportive care with flutter valve, incentive spirometry, Mucinex,  - ambulatory O2 testing will be needed prior to discharge  Atrial Fibrillation? Noted on Telemetry while in the ED, no  recurrent events . TSH wnl. Currently rate controlled. If confirmed-continue closely monitor  Acute on chronic systolic/diastolic CHF exacerbation in setting of nonadherence to lasix regimen, improving. Mitral valve regurgitation  EF remained stable at 20% and LV demonstrates global hypokinesis on repeat TTE here as well as grade 2 diastolic dysfunction.Presented with BNP greater than 4500, pulmonary edema and possible pleural effusion on chest x-ray that required BiPAP and IV Lasix now significantly improved.  No lower extremity swelling, however patient states most of her weight tends to build up in her belly , abdomen does not seem distended on my exam.  Hepatic congestion has improved, - 4.3 L this hospital stay, but question some inaccuracy in her output, her diuresis been complicated by worsening kidney function though it is now improving  -Appreciate cardiology recommendations, transition to oral Lasix 40 mg twice daily, monitor output -Patient is not amenable to potential transfer to [facility] if warranted because of kidney dysfunction related to IV diuresis - monitor output, daily weights, fluid restrict   COPD exacerbation, worsening Continued tobacco use Worsening O2 requirements, productive cough, dyspnea, persistent wheezing.  Patient is still A half pack-a-day smoker.  Previously required BiPAP, IV diuresis and scheduled duo nebs -We will add as needed albuterol nebs, will increase frequency of duo nebs to schedule (head decreased to as needed due to improvement on yesterday), add IV Levaquin(renally dosed) -Tobacco cessation counseling provided, patient currently in contemplative state -nicotine patch - incentive spirometer, flutter valve, add Mucinex -Will need outpatient pulmonary follow-up, has been arranged in the past  Elevated LFTs, likely hepatic congestion in setting of exacerbation of CHF, improving. Marland Kitchen  Patient is without abdominal pain.  Hepatitis panel within normal  limits -Abdominal ultrasound showed possible CBD dilation, patient without any pain pain in abdomen starts will consider MRCP for further evaluation  AKI, improving.   Suspect this is still prerenal in the setting of CHF exacerbation.  Previous baseline creatinin 0.9-1.  On admission creatinine 1.75, now 2.5 down from 3.35 last.  Still making adequate output with no current indication for dialysis -Avoid nephrotoxins -Continue to monitor BMP and output --Continue to monitor while on oral Lasix  Leukocytosis, suspect stress related to CHF/COPD exacerbation.  Has no localizing symptoms of infection has remained afebrile, likely further exacerbated by steroid therapy -Continue to monitor CBC  Elevated troponin, suspect demand ischemia given no ischemic changes on EKG, no chest pain, likely occurring in setting of CHF/COPD exacerbation -As needed EKG if chest pain  CAD, no chest pain -continue aspirin -has not been taking aspirin     Family Communication  : Daughter updated over phone on 10/14  Code Status : Full code, as discussed on day of admission  Disposition Plan  :  Patient is from home. Anticipated d/c date:  1 to 2 days. Barriers to d/c or necessity for inpatient status:  Need to closely monitor kidney function given persistent AKI, determine need for further IV Lasix versus transition to oral Consults  : Cardiology  Procedures  : TTE, 10/14  DVT Prophylaxis  : Heparin  MDM: The below labs and imaging reports were reviewed and summarized above.  Medication management as above.  Lab Results  Component Value Date   PLT 163 03/25/2020    Diet :  Diet Order            Diet renal/carb modified with fluid restriction Diet-HS Snack? Nothing; Fluid restriction: 1200 mL Fluid; Room service appropriate? Yes; Fluid consistency: Thin  Diet effective now                  Inpatient Medications Scheduled Meds: . aspirin EC  81 mg Oral Daily  . Chlorhexidine Gluconate Cloth   6 each Topical Daily  . furosemide  40 mg Oral BID  . heparin  5,000 Units Subcutaneous Q8H  . ipratropium-albuterol  3 mL Nebulization Q6H  . nicotine  14 mg Transdermal Daily  . predniSONE  40 mg Oral Q breakfast  . sodium chloride flush  3 mL Intravenous Q12H   Continuous Infusions: . sodium chloride     PRN Meds:.sodium chloride, acetaminophen, menthol-cetylpyridinium, ondansetron (ZOFRAN) IV, sodium chloride flush, traMADol  Antibiotics  :   Anti-infectives (From admission, onward)   None       Objective   Vitals:   03/25/20 2145 03/26/20 0447 03/26/20 0600 03/26/20 1255  BP: (!) 95/51  103/60   Pulse: 72  88   Resp: 18  18   Temp: 97.9 F (36.6 C)  98.1 F (36.7 C)   TempSrc:      SpO2: 99% 100% 98% 97%  Weight:      Height:        SpO2: 97 % O2 Flow Rate (L/min): 2 L/min FiO2 (%): 60 %  Wt Readings from Last 3 Encounters:  03/25/20 66.6 kg  12/09/19 62.2 kg  10/10/19 61.8 kg     Intake/Output Summary (Last 24 hours) at 03/26/2020 1446 Last data filed at 03/26/2020 0948 Gross per 24 hour  Intake 360 ml  Output 1675 ml  Net -1315 ml    Physical Exam:  Awake Alert, Oriented X 3, Normal affect No new F.N deficits,  [place].AT, Normal respiratory effort on room air, and scant wheezing in lung bases, no conversational dyspnea Irregularly irregular rhythm, normal rate,No Gallops,Rubs or new Murmurs, no peripheral edema +ve B.Sounds, Abd Soft, No tenderness, No rebound, guarding or rigidity. No Cyanosis, No new Rash or bruise    I have personally reviewed the following:   Data Reviewed:  CBC Recent Labs  Lab 03/21/20 0954 03/21/20 2114 03/23/20 0913 03/24/20 0534 03/25/20 0747  WBC 16.6* 16.0* 18.3* 11.9* 12.6*  HGB 10.5* 9.6* 9.5* 9.1* 9.3*  HCT 36.9 32.1* 32.6* 29.4* 30.1*  PLT 273 199 200 157 163  MCV 85.8 79.3* 81.3 77.8* 78.2*  MCH 24.4* 23.7* 23.7* 24.1* 24.2*  MCHC 28.5* 29.9* 29.1* 31.0 30.9  RDW 21.7* 21.0* 21.1* 20.9*  20.9*  LYMPHSABS 4.7*  --  2.4 0.9 1.3  MONOABS 1.2*  --  1.0 0.7 0.8  EOSABS 0.0  --  0.0 0.0 0.0  BASOSABS 0.1  --  0.0 0.0 0.0    Chemistries  Recent Labs  Lab 03/21/20 0954 03/21/20 0954 03/21/20 1547 03/22/20 0315 03/22/20 0315 03/23/20 0913 03/24/20 0534 03/25/20 0747 03/25/20 1129 03/26/20 0636  NA 134*   < >  --  132*   < > 131* 131* 134* 135 134*  K 4.5   < >  --  4.8   < > 4.2 5.2* 4.2 4.1 4.0  CL 98   < >  --  102   < > 99 98 96* 95* 97*  CO2 12*   < >  --  18*   < > 19* 21* 24 24 24   GLUCOSE 126*   < >  --  125*   < > 82 131* 117* 105* 133*  BUN 22   < >  --  42*   < > 71* 78* 82* 81* 81*  CREATININE 1.75*   < >  --  2.49*   < > 3.27* 3.35* 2.67* 2.67* 2.50*  CALCIUM 8.5*   < >  --  8.1*   < > 7.9* 8.4* 8.4* 8.6* 8.6*  MG  --   --  2.5*  --   --   --  2.5* 2.5*  --  2.5*  AST 258*  --   --  519*  --  166* 100*  --  66*  --   ALT 144*  --   --  365*  --  292* 243*  --  191*  --   ALKPHOS 100  --   --  95  --  83 82  --  83  --   BILITOT 0.9  --   --  0.8  --  0.7 0.2*  --  0.8  --    < > = values in this interval not displayed.   ------------------------------------------------------------------------------------------------------------------ No results for input(s): CHOL, HDL, LDLCALC, TRIG, CHOLHDL, LDLDIRECT in the last 72 hours.  Lab Results  Component Value Date   HGBA1C 5.5 10/06/2019   ------------------------------------------------------------------------------------------------------------------ No results for input(s): TSH, T4TOTAL, T3FREE, THYROIDAB in the last 72 hours.  Invalid input(s): FREET3 ------------------------------------------------------------------------------------------------------------------ No results for input(s): VITAMINB12, FOLATE, FERRITIN, TIBC, IRON, RETICCTPCT in the last 72 hours.  Coagulation profile Recent Labs  Lab 03/21/20 1547  INR 1.4*    No results for input(s): DDIMER in the last 72 hours.  Cardiac  Enzymes No results for input(s): CKMB, TROPONINI, MYOGLOBIN in the last 168 hours.  Invalid input(s): CK ------------------------------------------------------------------------------------------------------------------    Component Value Date/Time   BNP 2,397.0 (H) 03/25/2020 1129    Micro Results Recent Results (from the past 240 hour(s))  Respiratory Panel by RT PCR (Flu A&B, Covid) - Nasopharyngeal Swab     Status: None   Collection Time: 03/21/20  9:53 AM   Specimen: Nasopharyngeal Swab  Result Value Ref Range Status   SARS Coronavirus 2 by RT PCR NEGATIVE NEGATIVE Final    Comment: (NOTE) SARS-CoV-2 target nucleic acids are NOT DETECTED.  The SARS-CoV-2 RNA is generally detectable in upper respiratoy specimens during the acute phase of infection. The lowest concentration of SARS-CoV-2 viral copies this assay can detect is 131 copies/mL. A negative result does not preclude SARS-Cov-2 infection and should not be used as the sole basis for treatment or other patient management decisions. A negative result may occur with  improper specimen collection/handling, submission of specimen other than nasopharyngeal swab, presence of viral mutation(s) within the areas targeted by this assay, and inadequate number of viral copies (<131 copies/mL). A negative result must be combined with clinical observations, patient history, and epidemiological information. The expected result is Negative.  Fact Sheet for Patients:  PinkCheek.be  Fact Sheet for Healthcare Providers:  GravelBags.it  This test is no t yet approved or cleared by the [place] FDA and  has been authorized for detection and/or diagnosis of SARS-CoV-2 by FDA under an Emergency Use Authorization (EUA). This EUA will remain  in effect (meaning this test can be used) for the duration of the COVID-19 declaration under Section 564(b)(1) of the Act, 21  U.S.C. section 360bbb-3(b)(1), unless the authorization is terminated or revoked sooner.     Influenza A by PCR NEGATIVE NEGATIVE Final   Influenza B by PCR NEGATIVE NEGATIVE Final    Comment: (NOTE) The Xpert Xpress SARS-CoV-2/FLU/RSV assay is intended as an aid in  the diagnosis of influenza from Nasopharyngeal swab specimens and  should not be used as a sole basis for treatment. Nasal washings and  aspirates are unacceptable for Xpert Xpress SARS-CoV-2/FLU/RSV  testing.  Fact Sheet for Patients: PinkCheek.be  Fact Sheet for Healthcare Providers: GravelBags.it  This test is not yet approved or cleared by the [place] FDA and  has been authorized for detection and/or diagnosis of SARS-CoV-2 by  FDA under an Emergency Use Authorization (EUA). This EUA will remain  in effect (meaning this test can be used) for the duration of the  Covid-19 declaration under Section 564(b)(1) of the Act, 21  U.S.C. section 360bbb-3(b)(1), unless the authorization is  terminated or revoked. Performed at [facility], [address] [place], [place] [address]     Radiology Reports DG CHEST PORT 1 VIEW  Result Date: 03/26/2020 CLINICAL DATA:  Shortness of breath and wheezing. Cough. History of left mastectomy. EXAM: PORTABLE CHEST 1 VIEW COMPARISON:  03/21/2020 12/07/2019.  12/04/2019. FINDINGS: Mediastinum hilar structures are stable. Stable severe cardiomegaly no pulmonary venous congestion bilateral peribronchial cuffing noted, bronchitis cannot be excluded. Mild bibasilar atelectasis and or scarring again noted. Stable biapical scratched it stable basilar pleural thickening consistent with scarring. No pleural effusion or pneumothorax. Left mastectomy. No acute bony abnormality. Thoracic spine scoliosis and degenerative change again noted. IMPRESSION: 1. Peribronchial cuffing noted bilaterally, bronchitis cannot be excluded. 2. Mild  bibasilar atelectasis and or scarring again noted. No interim change. No acute infiltrate noted. 3. Stable severe cardiomegaly.  No pulmonary venous congestion. Electronically Signed   By: Marcello Moores  Register   On:  03/26/2020 05:47   DG Chest Portable 1 View  Result Date: 03/21/2020 CLINICAL DATA:  Shortness of breath, increased shortness of breath with COPD EXAM: PORTABLE CHEST 1 VIEW COMPARISON:  December 07, 2019 FINDINGS: Trachea midline. Cardiomediastinal contours remain markedly enlarged. Configuration of the heart raising the question of pericardial effusion though not significantly changed from prior study. Aortic atherosclerosis in the aortic arch. Partially obscured LEFT hemidiaphragm. Increased interstitial markings without lobar consolidation. Limited assessment of skeletal structures without acute process. IMPRESSION: 1. Marked enlargement of the cardiomediastinal contours with findings of pulmonary edema and possible LEFT effusion. 2. Partially obscured LEFT hemidiaphragm may represent atelectasis or developing infiltrate. 3. Cardiac configuration raising the question of pericardial effusion though not significantly changed when compared to the previous exam. Electronically Signed   By: Zetta Bills M.D.   On: 03/21/2020 10:27   ECHOCARDIOGRAM COMPLETE  Result Date: 03/21/2020    ECHOCARDIOGRAM REPORT   Patient Name:   HELI DINO Date of Exam: 03/21/2020 Medical Rec #:  [id number]        Height:       66.5 in Accession #:    [id number]       Weight:       137.1 lb Date of Birth:  15-Feb-1946        BSA:          1.713 m Patient Age:    65 years         BP:           106/74 mmHg Patient Gender: F                HR:           92 bpm. Exam Location:  [facility] Procedure: Cardiac Doppler, Color Doppler and 2D Echo Indications:    Acute systolic chf. Cxr mentions concern for pericardial                 effusion  History:        Patient has prior history of Echocardiogram examinations, most                  recent 12/27/2019. COPD; Risk Factors:Dyslipidemia and Current                 Smoker. Acute respiratory failure, MITRAL REGURGITATION.  Sonographer:    Leavy Cella RDCS (AE) Referring Phys: [id number] [place]  1. Images are limited.  2. Left ventricular ejection fraction, by estimation, is approximately 20%. The left ventricle has severely decreased function. The left ventricle demonstrates global hypokinesis with some regional variation. Left ventricular diastolic parameters are consistent with Grade II diastolic dysfunction (pseudonormalization).  3. Right ventricular systolic function is moderately reduced. The right ventricular size is normal. There is moderately elevated pulmonary artery systolic pressure. The estimated right ventricular systolic pressure is 02.5 mmHg.  4. Left atrial size was mild to moderately dilated.  5. Right atrial size was mildly dilated.  6. The mitral valve is abnormal. Moderate to severe mitral valve regurgitation. Moderate mitral annular calcification.  7. The aortic valve is tricuspid. There is mild calcification of the aortic valve. Aortic valve regurgitation is mild.  8. The inferior vena cava is dilated in size with >50% respiratory variability, suggesting right atrial pressure of 8 mmHg. FINDINGS  Left Ventricle: Left ventricular ejection fraction, by estimation, is 20%. The left ventricle has severely decreased function. The left ventricle demonstrates global hypokinesis. The left ventricular internal cavity  size was normal in size. There is no left ventricular hypertrophy. Abnormal (paradoxical) septal motion, consistent with left bundle branch block. Left ventricular diastolic parameters are consistent with Grade II diastolic dysfunction (pseudonormalization). Right Ventricle: The right ventricular size is normal. No increase in right ventricular wall thickness. Right ventricular systolic function is moderately reduced. There is moderately elevated  pulmonary artery systolic pressure. The tricuspid regurgitant velocity is 3.27 m/s, and with an assumed right atrial pressure of 8 mmHg, the estimated right ventricular systolic pressure is 35.7 mmHg. Left Atrium: Left atrial size was mild to moderately dilated. Right Atrium: Right atrial size was mildly dilated. Pericardium: There is no evidence of pericardial effusion. Mitral Valve: The mitral valve is abnormal. Moderate mitral annular calcification. Moderate to severe mitral valve regurgitation. Tricuspid Valve: The tricuspid valve is grossly normal. Tricuspid valve regurgitation is mild. Aortic Valve: The aortic valve is tricuspid. There is mild calcification of the aortic valve. Aortic valve regurgitation is mild. Pulmonic Valve: The pulmonic valve was not well visualized. Pulmonic valve regurgitation is trivial. Aorta: The aortic root is normal in size and structure. Venous: The inferior vena cava is dilated in size with greater than 50% respiratory variability, suggesting right atrial pressure of 8 mmHg. IAS/Shunts: No atrial level shunt detected by color flow Doppler.  LEFT VENTRICLE PLAX 2D LVIDd:         5.07 cm  Diastology LVIDs:         4.40 cm  LV e' medial:    4.68 cm/s LV PW:         1.25 cm  LV E/e' medial:  20.8 LV IVS:        1.31 cm  LV e' lateral:   7.88 cm/s LVOT diam:     1.90 cm  LV E/e' lateral: 12.3 LVOT Area:     2.84 cm  RIGHT VENTRICLE RV S prime:     11.50 cm/s LEFT ATRIUM             Index       RIGHT ATRIUM           Index LA diam:        3.70 cm 2.16 cm/m  RA Area:     21.00 cm LA Vol (A2C):   96.8 ml 56.51 ml/m RA Volume:   64.00 ml  37.36 ml/m LA Vol (A4C):   47.1 ml 27.50 ml/m LA Biplane Vol: 70.0 ml 40.87 ml/m   AORTA Ao Root diam: 2.70 cm MITRAL VALVE                 TRICUSPID VALVE MV Area (PHT): 5.34 cm      TR Peak grad:   42.8 mmHg MV Decel Time: 142 msec      TR Vmax:        327.00 cm/s MR Peak grad:    101.6 mmHg MR Mean grad:    60.0 mmHg   SHUNTS MR Vmax:          504.00 cm/s Systemic Diam: 1.90 cm MR Vmean:        357.0 cm/s MR PISA:         3.08 cm MR PISA Eff ROA: 19 mm MR PISA Radius:  0.70 cm MV E velocity: 97.30 cm/s MV A velocity: 52.30 cm/s MV E/A ratio:  1.86 Rozann Lesches MD Electronically signed by Rozann Lesches MD Signature Date/Time: 03/21/2020/4:39:46 PM    Final    US Abdomen Limited RUQ  Result Date: 03/21/2020  CLINICAL DATA:  Increased LFTs. EXAM: ULTRASOUND ABDOMEN LIMITED RIGHT UPPER QUADRANT COMPARISON:  None. FINDINGS: Gallbladder: Status post cholecystectomy. Common bile duct: Diameter: 11 mm, which is dilated. Liver: No focal lesion identified, although evaluation is limited secondary to poor sonographic window. Mildly increased echogenicity of the liver diffusely. Portal vein is patent on color Doppler imaging with normal direction of blood flow towards the liver. Other: Right pleural effusion. IMPRESSION: 1. The proximal common bile duct is dilated, measuring 11 mm. No evidence of proximal obstructing lesion; however, the distal common bile duct is not well evaluated sonographically. If there is concern for obstruction, MRI/MRCP or CT could further evaluate. 2. Mildly increased echogenicity of the liver diffusely, which may relate to hepatic steatosis or chronic hepatic disease. 3. Right pleural effusion. 4. Status post cholecystectomy. Electronically Signed   By: Margaretha Sheffield MD   On: 03/21/2020 13:27     Time Spent in minutes  30     Desiree Hane M.D on 03/26/2020 at 2:46 PM  To page go to [email or web address] - password [email or web address]

## 2020-03-26 NOTE — Progress Notes (Signed)
PT Cancellation Note  Patient Details Name: [name] MRN: [id number] DOB: 1946-05-28   Cancelled Treatment:    Reason Eval/Treat Not Completed: Patient declined, no reason specified. Pt declines therapy due to "I had an episode this morning" regarding her SOB and not wanting to have another. Will check back as schedule permits.    Talbot Grumbling PT, DPT 03/26/20, 12:27 PM [phone]

## 2020-03-27 ENCOUNTER — Inpatient Hospital Stay (HOSPITAL_COMMUNITY): Payer: Medicare Other

## 2020-03-27 DIAGNOSIS — I5043 Acute on chronic combined systolic (congestive) and diastolic (congestive) heart failure: Secondary | ICD-10-CM | POA: Diagnosis not present

## 2020-03-27 LAB — BASIC METABOLIC PANEL
Anion gap: 16 — ABNORMAL HIGH (ref 5–15)
BUN: 81 mg/dL — ABNORMAL HIGH (ref 8–23)
CO2: 24 mmol/L (ref 22–32)
Calcium: 8.8 mg/dL — ABNORMAL LOW (ref 8.9–10.3)
Chloride: 97 mmol/L — ABNORMAL LOW (ref 98–111)
Creatinine, Ser: 2.21 mg/dL — ABNORMAL HIGH (ref 0.44–1.00)
GFR, Estimated: 21 mL/min — ABNORMAL LOW (ref >60)
Glucose, Bld: 121 mg/dL — ABNORMAL HIGH (ref 70–99)
Potassium: 3.8 mmol/L (ref 3.5–5.1)
Sodium: 137 mmol/L (ref 135–145)

## 2020-03-27 LAB — CBC WITH DIFFERENTIAL/PLATELET
Abs Immature Granulocytes: 0.21 10*3/uL — ABNORMAL HIGH (ref 0.00–0.07)
Basophils Absolute: 0 10*3/uL (ref 0.0–0.1)
Basophils Relative: 0 %
Eosinophils Absolute: 0 10*3/uL (ref 0.0–0.5)
Eosinophils Relative: 0 %
HCT: 30 % — ABNORMAL LOW (ref 36.0–46.0)
Hemoglobin: 9 g/dL — ABNORMAL LOW (ref 12.0–15.0)
Immature Granulocytes: 2 %
Lymphocytes Relative: 10 %
Lymphs Abs: 1.4 10*3/uL (ref 0.7–4.0)
MCH: 23.7 pg — ABNORMAL LOW (ref 26.0–34.0)
MCHC: 30 g/dL (ref 30.0–36.0)
MCV: 78.9 fL — ABNORMAL LOW (ref 80.0–100.0)
Monocytes Absolute: 1 10*3/uL (ref 0.1–1.0)
Monocytes Relative: 8 %
Neutro Abs: 10.6 10*3/uL — ABNORMAL HIGH (ref 1.7–7.7)
Neutrophils Relative %: 80 %
Platelets: 234 10*3/uL (ref 150–400)
RBC: 3.8 MIL/uL — ABNORMAL LOW (ref 3.87–5.11)
RDW: 20.9 % — ABNORMAL HIGH (ref 11.5–15.5)
WBC: 13.2 10*3/uL — ABNORMAL HIGH (ref 4.0–10.5)
nRBC: 0.9 % — ABNORMAL HIGH (ref 0.0–0.2)

## 2020-03-27 LAB — MAGNESIUM: Magnesium: 2.3 mg/dL (ref 1.7–2.4)

## 2020-03-27 MED ORDER — METOPROLOL SUCCINATE ER 25 MG PO TB24
12.5000 mg | ORAL_TABLET | Freq: Every day | ORAL | Status: DC
  Administered 2020-03-28: 12.5 mg via ORAL
  Filled 2020-03-27: qty 1

## 2020-03-27 MED ORDER — HALOPERIDOL LACTATE 5 MG/ML IJ SOLN
1.0000 mg | Freq: Four times a day (QID) | INTRAMUSCULAR | Status: AC | PRN
  Administered 2020-03-27: 1 mg via INTRAVENOUS
  Filled 2020-03-27: qty 1

## 2020-03-27 NOTE — Evaluation (Signed)
Occupational Therapy Evaluation Patient Details Name: [name] MRN: [id number] DOB: 1946-03-06 Today's Date: 03/27/2020    History of Present Illness Ms. Riedlinger is a 74 year old female with medical history significant for chronic systolic CHF with EF of 59% (TTE on 12/2019), HTN, HLD, COPD who presented to the ED on 10/14 with complaints of worsening shortness of breath over several weeks but particular over the last few days.  Patient unable to provide much history as she is currently receiving BiPAP mask during examination and review of chart and discussing with the ED provider patient's daughter called EMS for shortness of breath.  At that time EMS placed patient on CPAP with good response and noted oxygen saturation in the 90s.   Clinical Impression   Pt marginally agreeable to OT evaluation. Pt stating she doesn't want to move because she is afraid she will have an "episode." Educated pt on importance of mobility to maintain functional ability. Pt sitting at EOB for ADLs, increased time for rest breaks. Pt with limited activity tolerance, discussed sitting for tasks at home to preserve energy. Pt is functioning near baseline for ADLs, reports has family at home to assist as needed. No further OT services required at this time.      Follow Up Recommendations  No OT follow up;Supervision - Intermittent    Equipment Recommendations  Tub/shower seat       Precautions / Restrictions Precautions Precautions: Fall Restrictions Weight Bearing Restrictions: No      Mobility Bed Mobility Overal bed mobility: Modified Independent                Transfers Overall transfer level: Modified independent Equipment used: None                      ADL either performed or assessed with clinical judgement   ADL Overall ADL's : Needs assistance/impaired Eating/Feeding: Modified independent;Sitting   Grooming: Wash/dry hands;Wash/dry face;Set up;Sitting Grooming  Details (indicate cue type and reason): poor tolerance to standing         Upper Body Dressing : Modified independent;Sitting   Lower Body Dressing: Supervision/safety;Sitting/lateral leans;Sit to/from stand   Toilet Transfer: Supervision/safety;Ambulation;Regular Toilet   Toileting- Clothing Manipulation and Hygiene: Supervision/safety;Sitting/lateral lean         General ADL Comments: Pt requiring increased time for ADLs due to anxiety and extended rest breaks     Vision Baseline Vision/History: No visual deficits Patient Visual Report: No change from baseline Vision Assessment?: No apparent visual deficits            Pertinent Vitals/Pain Pain Assessment: No/denies pain     Hand Dominance Right   Extremity/Trunk Assessment Upper Extremity Assessment Upper Extremity Assessment: Generalized weakness   Lower Extremity Assessment Lower Extremity Assessment: Defer to PT evaluation   Cervical / Trunk Assessment Cervical / Trunk Assessment: Normal   Communication Communication Communication: HOH   Cognition Arousal/Alertness: Awake/alert Behavior During Therapy: WFL for tasks assessed/performed;Anxious Overall Cognitive Status: Within Functional Limits for tasks assessed                                                Home Living Family/patient expects to be discharged to:: Private residence Living Arrangements: Children (daughter and grandson) Available Help at Discharge: Family;Available 24 hours/day Type of Home: Mobile home Home Access: Level entry  Home Layout: One level     Bathroom Shower/Tub: Teacher, early years/pre: Standard     Home Equipment: None          Prior Functioning/Environment Level of Independence: Independent        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, but recently more fatigued and ambulating household distances; drives, Ind with ADLs, daughter or grandson are usually home with pt         OT Problem List: Decreased activity tolerance;Cardiopulmonary status limiting activity       End of Session Equipment Utilized During Treatment: Oxygen  Activity Tolerance: Patient tolerated treatment well Patient left: in bed;with call bell/phone within reach  OT Visit Diagnosis: Muscle weakness (generalized) (M62.81)                Time: 3567-0141 OT Time Calculation (min): 17 min Charges:  OT General Charges $OT Visit: 1 Visit OT Evaluation $OT Eval Low Complexity: [place], OTR/L  [phone] 03/27/2020, 8:01 AM

## 2020-03-27 NOTE — Progress Notes (Addendum)
Progress Note  Patient Name: [name] Date of Encounter: 03/27/2020  Primary Cardiologist: Carlyle Dolly, MD   Subjective   No chest pain or palpitations. Breathing improved. Anxious to go home.   Inpatient Medications    Scheduled Meds: . aspirin EC  81 mg Oral Daily  . Chlorhexidine Gluconate Cloth  6 each Topical Daily  . furosemide  40 mg Oral BID  . guaiFENesin  600 mg Oral BID  . heparin  5,000 Units Subcutaneous Q8H  . ipratropium-albuterol  3 mL Nebulization TID  . [START ON 03/28/2020] metoprolol succinate  12.5 mg Oral Daily  . nicotine  14 mg Transdermal Daily  . sodium chloride flush  3 mL Intravenous Q12H   Continuous Infusions: . sodium chloride    . [START ON 03/28/2020] levofloxacin (LEVAQUIN) IV     PRN Meds: sodium chloride, acetaminophen, albuterol, menthol-cetylpyridinium, ondansetron (ZOFRAN) IV, sodium chloride flush, traMADol   Vital Signs    Vitals:   03/26/20 2059 03/26/20 2340 03/27/20 0438 03/27/20 0511  BP: (!) 112/40 (!) 104/51  (!) 115/54  Pulse: 89 93  94  Resp: 18   18  Temp: (!) 97.4 F (36.3 C)   98.1 F (36.7 C)  TempSrc: Oral   Oral  SpO2: 99% 100% 96% 100%  Weight:    64.8 kg  Height:        Intake/Output Summary (Last 24 hours) at 03/27/2020 1028 Last data filed at 03/27/2020 0500 Gross per 24 hour  Intake 335.78 ml  Output 2000 ml  Net -1664.22 ml    Last 3 Weights 03/27/2020 03/25/2020 03/25/2020  Weight (lbs) 142 lb 13.7 oz 146 lb 13.2 oz 145 lb 11.6 oz  Weight (kg) 64.8 kg 66.6 kg 66.1 kg      Telemetry    NSR, HR in 80's to 90's with occasional PVC's. - Personally Reviewed  ECG    No new tracings.   Physical Exam   General: Well developed, elderly female appearing in no acute distress. Head: Normocephalic, atraumatic.  Neck: Supple without bruits, JVD not elevated. Lungs:  Resp regular and unlabored, occasional rhonchi. Heart: RRR, S1, S2, no S3, S4, or murmur; no rub. Abdomen: Soft,  non-tender, non-distended with normoactive bowel sounds. No hepatomegaly. No rebound/guarding. No obvious abdominal masses. Extremities: No clubbing, cyanosis, or edema. Distal pedal pulses are 2+ bilaterally. Neuro: Alert and oriented X 3. Moves all extremities spontaneously. Psych: Normal affect.  Labs    Chemistry Recent Labs  Lab 03/23/20 0913 03/23/20 0913 03/24/20 0534 03/25/20 0747 03/25/20 1129 03/26/20 0636 03/27/20 0423  NA 131*   < > 131*   < > 135 134* 137  K 4.2   < > 5.2*   < > 4.1 4.0 3.8  CL 99   < > 98   < > 95* 97* 97*  CO2 19*   < > 21*   < > 24 24 24   GLUCOSE 82   < > 131*   < > 105* 133* 121*  BUN 71*   < > 78*   < > 81* 81* 81*  CREATININE 3.27*   < > 3.35*   < > 2.67* 2.50* 2.21*  CALCIUM 7.9*   < > 8.4*   < > 8.6* 8.6* 8.8*  PROT 6.2*  --  6.4*  --  6.6  --   --   ALBUMIN 3.4*  --  3.4*  --  3.6  --   --   AST 166*  --  100*  --  66*  --   --   ALT 292*  --  243*  --  191*  --   --   ALKPHOS 83  --  82  --  83  --   --   BILITOT 0.7  --  0.2*  --  0.8  --   --   GFRNONAA 13*   < > 13*   < > 17* 18* 21*  ANIONGAP 13   < > 12   < > 16* 13 16*   < > = values in this interval not displayed.     Hematology Recent Labs  Lab 03/24/20 0534 03/25/20 0747 03/27/20 0423  WBC 11.9* 12.6* 13.2*  RBC 3.78* 3.85* 3.80*  HGB 9.1* 9.3* 9.0*  HCT 29.4* 30.1* 30.0*  MCV 77.8* 78.2* 78.9*  MCH 24.1* 24.2* 23.7*  MCHC 31.0 30.9 30.0  RDW 20.9* 20.9* 20.9*  PLT 157 163 234    BNP Recent Labs  Lab 03/21/20 0954 03/25/20 1129  BNP >4,500.0* 2,397.0*     Radiology    DG CHEST PORT 1 VIEW  Result Date: 03/26/2020 CLINICAL DATA:  Shortness of breath and wheezing. Cough. History of left mastectomy. EXAM: PORTABLE CHEST 1 VIEW COMPARISON:  03/21/2020 12/07/2019.  12/04/2019. FINDINGS: Mediastinum hilar structures are stable. Stable severe cardiomegaly no pulmonary venous congestion bilateral peribronchial cuffing noted, bronchitis cannot be excluded. Mild  bibasilar atelectasis and or scarring again noted. Stable biapical scratched it stable basilar pleural thickening consistent with scarring. No pleural effusion or pneumothorax. Left mastectomy. No acute bony abnormality. Thoracic spine scoliosis and degenerative change again noted. IMPRESSION: 1. Peribronchial cuffing noted bilaterally, bronchitis cannot be excluded. 2. Mild bibasilar atelectasis and or scarring again noted. No interim change. No acute infiltrate noted. 3. Stable severe cardiomegaly.  No pulmonary venous congestion. Electronically Signed   By: Marcello Moores  Register   On: 03/26/2020 05:47    Cardiac Studies   Cardiac Catheterization: 10/2019 Conclusions: 1. Mild to moderate, non-obstructive coronary artery disease, including 15% ostial LMCA, 20% proximal and mid LAD, and 40% mid LCx stenoses, as well as 50% proximal and 30% mid/distal RCA lesions. 2. 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  The aneurysm appears larger compared with 2010. 3. Patent stent in the mid RCA with minimal in-stent restenosis. 4. Normal right and left heart filling pressures. 5. Mild pulmonary hypertension. 6. Normal Fick cardiac output/index.  Recommendations: 1. Optimize evidence-based medical therapy for acute systolic heart failure.  Reduction in left ventricular ejection fraction is out of proportion to the degree of coronary artery disease, consistent with non-ischemic cardiomyopathy. 2. Aggressive secondary prevention of coronary artery disease.   Echocardiogram: 03/21/2020 IMPRESSIONS    1. Images are limited.  2. Left ventricular ejection fraction, by estimation, is approximately  20%. The left ventricle has severely decreased function. The left  ventricle demonstrates global hypokinesis with some regional variation.  Left ventricular diastolic parameters are  consistent with Grade II diastolic dysfunction (pseudonormalization).  3. Right  ventricular systolic function is moderately reduced. The right  ventricular size is normal. There is moderately elevated pulmonary artery  systolic pressure. The estimated right ventricular systolic pressure is  82.9 mmHg.  4. Left atrial size was mild to moderately dilated.  5. Right atrial size was mildly dilated.  6. The mitral valve is abnormal. Moderate to severe mitral valve  regurgitation. Moderate mitral annular calcification.  7. The aortic valve is tricuspid. There is mild  calcification of the  aortic valve. Aortic valve regurgitation is mild.  8. The inferior vena cava is dilated in size with >50% respiratory  variability, suggesting right atrial pressure of 8 mmHg.    Patient Profile     74 y.o. female w/ PMH of chronic systolic CHF/NICM(EF 30% by echocardiogram in 09/2019), CAD (s/p BMS to RCA in 2010, cath in 10/2019 showing mild to moderate disease and patent RCA stent with minimal ISRwith reduction in LV function out of proportion to the degree of CAD), HTN, HLD, carotid artery stenosis (s/p L CEA in 2010), moderate to severe MR, COPD and history of breast cancer who is currently admitted for an acute CHF exacerbation.   Assessment & Plan    1. Chronic Systolic CHF/NICM - Presented with worsening dyspnea, orthopnea and edema for the past week and has been without her medications (including Lasix) for over 2 weeks. BNP > 4500 and CXR consistent with CHF.  - Was receiving IV Lasix but now discontinued given her AKI with plans to restart PO Lasix 40mg  BID today. Net output of -6.2L thus far and weight has declined to 142 lbs (previous dry weight was at 136 lbs in 10/2019 at the time of her RHC) but renal function has limited further IV diuresis and she was resistant to transfer to [facility].  - On Toprol-XL and Spironolactone as an outpatient which are currently held given her soft BP. BP has now improved following the transition to PO diuretic therapy so would anticipate adding  back Toprol-XL at a lower dose of 12.5mg  daily. Can possibly add back Spironolactone as an outpatient if BP allows. If compliant with medications and outpatient follow-up, would consider EP referral for CRT given her LBBB.  2. CAD - She is s/p BMS to RCA in 2010 with recent cath in 10/2019 showing mild to moderate disease and patent RCA stent with minimal ISR with reduction in LV function out of proportion to the degree of CAD.  - Remains on ASA 81mg  daily. Statin held given elevated LFT's. Would reduce Simvastatin to 40mg  daily at the time of discharge or switch to Crestor 20mg  daily.   3. HTN - BP has been soft at 104/40 - 115/54 within the past 24 hours. PTA Toprol-XL and Spironolactone were held due to soft BP. Will try to restart Toprol-XL at a lower dose of 12.5mg  daily.   4. Mitral Regurgitation - Repeat echo this admission shows moderate to severe MR. Continue to follow as outpatient.   5. COPD Exacerbation - Received steroids earlier this admission and remains on scheduled nebs. Further management per admitting team.   6. AKI - Baseline creatinine 1.0 - 1.1. Peaked at 3.35 this admission, improved to 2.21 today.   7. Elevated LFT's -  LFT's peaked with AST at 519 and ALT 365, improved to 66 and 191 yesterday. Felt to be secondary to hepatic congestion as Abdominal US showed dilation of the bile duct with no evidence of obstruction.    Close follow-up arranged for next week. Encouraged the patient to follow daily weights and vitals at home in the interim.    For questions or updates, please contact [place] Please consult [email or web address] for contact info under Cardiology/STEMI.   Arna Medici , PA-C 10:28 AM 03/27/2020 Pager: [phone]   Attending note:  Patient seen and examined.  I reviewed hospital course and consultation by Dr. Harl Bowie, also discussed the case with Ms. Delano Metz.  I agree with her above findings.  Ms. Mihok does not  report any chest pain or shortness of breath at rest this morning.  Heart rate is in the 80's to 90's in sinus rhythm, systolic blood pressure 998-721 range.  Pertinent lab work includes potassium 3.8, creatinine 2.21 down from 2.50, hemoglobin 9.0, platelets 234.  Acute on chronic systolic heart failure in the setting of medication noncompliance with known nonischemic cardiomyopathy.  Follow-up echocardiogram during this admission shows LVEF approximately 20% with moderate RV dysfunction, also moderate to severe mitral regurgitation.  She has diuresed just over 6 L during admission and present regimen includes Lasix 40 mg twice daily and Toprol-XL 12.5 mg daily.  Low normal blood pressure precludes further addition of medications now, but anticipate possible resumption of Aldactone as an outpatient presuming renal function continues to improve and blood pressure further stabilizes.  Office follow-up scheduled.  Can have further discussion regarding referral to the heart failure clinic or possibly EP evaluation for CRT with left bundle branch block.  Satira Sark, M.D., F.A.C.C.

## 2020-03-27 NOTE — Progress Notes (Signed)
Initial Nutrition Assessment     INTERVENTION:  Attached Nutrition education-Low Sodium diet with discharge summary  RD will return on Thursday to follow up with Low Sodium diet education if pt is still here.   NUTRITION DIAGNOSIS:   Food and nutrition related knowledge deficit related to chronic illness (acute on chronic HF) as evidenced by  (Per MD pt non-compliant).  GOAL:   (Pt will adopt a low sodium eating pattern - long term)   MONITOR:  PO intake, Labs, I & O's, Weight trends   REASON FOR ASSESSMENT:   Consult Assessment of nutrition requirement/status  ASSESSMENT:  RD working remotely  Patient is a 74 yo female with  Hx of CHF, HTN, COPD, HLD who presents with dyspnea.   Unable to reach pt or nurse by telephone after multiple attempts. Chart reviewed. Meal intake 50-75%. Feeds herself. Non-compliance with low sodium diet her MD.    Medications reviewed and include: Lasix, Levaquin  Labs: BMP Latest Ref Rng & Units 03/27/2020 03/26/2020 03/25/2020  Glucose 70 - 99 mg/dL 121(H) 133(H) 105(H)  BUN 8 - 23 mg/dL 81(H) 81(H) 81(H)  Creatinine 0.44 - 1.00 mg/dL 2.21(H) 2.50(H) 2.67(H)  Sodium 135 - 145 mmol/L 137 134(L) 135  Potassium 3.5 - 5.1 mmol/L 3.8 4.0 4.1  Chloride 98 - 111 mmol/L 97(L) 97(L) 95(L)  CO2 22 - 32 mmol/L 24 24 24   Calcium 8.9 - 10.3 mg/dL 8.8(L) 8.6(L) 8.6(L)     Intake/Output Summary (Last 24 hours) at 03/27/2020 1624 Last data filed at 03/27/2020 1416 Gross per 24 hour  Intake 85.78 ml  Output 2850 ml  Net -2764.22 ml  Net output 6.2 liters  NUTRITION - FOCUSED PHYSICAL EXAM:  Unable to complete Nutrition-Focused physical exam at this time.    Diet Order:   Diet Order            Diet renal/carb modified with fluid restriction Diet-HS Snack? Nothing; Fluid restriction: 1200 mL Fluid; Room service appropriate? Yes; Fluid consistency: Thin  Diet effective now                 EDUCATION NEEDS:    (unable to reach pt by  telephone) Skin:  Skin Assessment: Reviewed RN Assessment  Last BM:  10/17  Height:   Ht Readings from Last 1 Encounters:  03/22/20 5\' 6"  (1.676 m)    Weight:   Wt Readings from Last 1 Encounters:  03/27/20 64.8 kg   Dry wt 136 lb (61.8 kg) -May 2021  Ideal Body Weight:   59 kg  BMI:  Body mass index is 23.06 kg/m.  Estimated Nutritional Needs:   Kcal:  0233-4356  Protein:  78-85 gr  Fluid:  < 2 liters daily   Colman Cater MS,RD,CSG,LDN Pager: Shea Evans

## 2020-03-27 NOTE — Progress Notes (Signed)
PT Cancellation Note  Patient Details Name: [name] MRN: [id number] DOB: 1946-06-01   Cancelled Treatment:    Reason Eval/Treat Not Completed: Fatigue/lethargy limiting ability to participate Attempted PT session.  Pt sleeping hard and unable to waken, attempted with verbal, tactile and lighting.  Ihor Austin, LPTA/CLT; CBIS [phone]   Aldona Lento 03/27/2020, 5:06 PM

## 2020-03-27 NOTE — Progress Notes (Signed)
PROGRESS NOTE    [name]  DGU:[id number] DOB: [birth year] DOA: 03/21/2020 PCP: Patient, No Pcp Per  Brief Narrative:   74 year old white female HFrEF EF 20% based on TTE 12/26/2019-recent cardiac cath 4, 10/2019 with aneurysm proximal mid RCA, HTN, HLD, COPD and is a continuous smoker Prior PCI X1 07/15/2008 with upper GI bleed 11/25/2009 and was taken off temporarily Plavix and aspirin Left CEA 03/11/2009 Admitted 12/04/2019-12/09/2019 with decompensated systolic heart failure  Readmit 03/21/2020 worsening shortness of breath-in ED placed on BiPAP-he was noncompliant with some medications Tachypneic on admission BNP >4500 WBC 16 troponin 81-->90 Covid negative CXR pulmonary edema  Patient Rx with Lasix, cardiology also consulted  Assessment & Plan:   Principal Problem:   Acute on chronic combined systolic and diastolic CHF (congestive heart failure) (HCC) Active Problems:   Mitral regurgitation   Hypertension   Coronary atherosclerosis   COPD exacerbation (HCC)   COPD (chronic obstructive pulmonary disease) (HCC)   Elevated LFTs   AKI (acute kidney injury) ([place])   1. Acute hypoxic respiratory failure secondary to AECHF, AE COPD a. Patient -7 liters, weight 66-->64 kg, cardiology = Lasix 40 p.o. twice daily, Toprol-XL 12.5 daily b. Need  OP HF clinic visit versus EP eval for CRT c. Transferring to floor later 2. Acute on chronic diastolic/systolic heart failure secondary to noncompliance a. As above per cardiology 3. Prior severe mitral regurg a. Will need discussion of the same and possible repeat echo to be repeated and discussed in the outpatient setting 4. AKI on admission a. Creatinine trending down diuretics ongoing b. Try to maintain negative balance 5. Leukocytosis with possible underlying pneumonia a. Will get CXR in a.m. and procalcitonin-it is unlikely she has two things going on at the same time and she is getting better 6. CAD status post stent  2010 a. Continue aspirin 81 daily  DVT prophylaxis: Heparin Code Status: Full CODE STATUS Family Communication: Called daughter on telephone [phone] Disposition:   Status is: Inpatient  Remains inpatient appropriate because:Persistent severe electrolyte disturbances   Dispo: The patient is from: Home              Anticipated d/c is to: Home              Anticipated d/c date is: 1 day              Patient currently is medically stable to d/c.       Consultants:   Cardiology  Procedures: None none  Antimicrobials: None   Subjective: Very hard of hearing but in no distress  Objective: Vitals:   03/26/20 2059 03/26/20 2340 03/27/20 0438 03/27/20 0511  BP: (!) 112/40 (!) 104/51  (!) 115/54  Pulse: 89 93  94  Resp: 18   18  Temp: (!) 97.4 F (36.3 C)   98.1 F (36.7 C)  TempSrc: Oral   Oral  SpO2: 99% 100% 96% 100%  Weight:    64.8 kg  Height:        Intake/Output Summary (Last 24 hours) at 03/27/2020 0730 Last data filed at 03/27/2020 0500 Gross per 24 hour  Intake 585.78 ml  Output 2225 ml  Net -1639.22 ml   Filed Weights   03/25/20 0509 03/25/20 1416 03/27/20 0511  Weight: 66.1 kg 66.6 kg 64.8 kg    Examination:  General exam: EOMI NCAT no focal deficit looks about stated age cannot appreciate JVD cannot appreciate bruit Respiratory system: Clear no rales no rhonchi Cardiovascular system: S1-S2  no murmur rub gallop on monitors PVC only Gastrointestinal system: Soft nontender no rebound no guarding. Central nervous system: Neurologically intact no focal deficit Extremities: No lower extremity edema Skin: As above Psychiatry: Euthymic and congruent  Data Reviewed: I have personally reviewed following labs and imaging studies Sodium 137 BUNs/creatinine 82/2.6-->81/2.2 White count 13.2 Hemoglobin 9.0 Platelet 234  Radiology Studies: DG CHEST PORT 1 VIEW  Result Date: 03/26/2020 CLINICAL DATA:  Shortness of breath and wheezing. Cough. History  of left mastectomy. EXAM: PORTABLE CHEST 1 VIEW COMPARISON:  03/21/2020 12/07/2019.  12/04/2019. FINDINGS: Mediastinum hilar structures are stable. Stable severe cardiomegaly no pulmonary venous congestion bilateral peribronchial cuffing noted, bronchitis cannot be excluded. Mild bibasilar atelectasis and or scarring again noted. Stable biapical scratched it stable basilar pleural thickening consistent with scarring. No pleural effusion or pneumothorax. Left mastectomy. No acute bony abnormality. Thoracic spine scoliosis and degenerative change again noted. IMPRESSION: 1. Peribronchial cuffing noted bilaterally, bronchitis cannot be excluded. 2. Mild bibasilar atelectasis and or scarring again noted. No interim change. No acute infiltrate noted. 3. Stable severe cardiomegaly.  No pulmonary venous congestion. Electronically Signed   By: [place]   On: 03/26/2020 05:47     Scheduled Meds: . aspirin EC  81 mg Oral Daily  . Chlorhexidine Gluconate Cloth  6 each Topical Daily  . furosemide  40 mg Oral BID  . guaiFENesin  600 mg Oral BID  . heparin  5,000 Units Subcutaneous Q8H  . ipratropium-albuterol  3 mL Nebulization TID  . nicotine  14 mg Transdermal Daily  . predniSONE  40 mg Oral Q breakfast  . sodium chloride flush  3 mL Intravenous Q12H   Continuous Infusions: . sodium chloride    . [START ON 03/28/2020] levofloxacin (LEVAQUIN) IV       LOS: 6 days    Time spent: 61  Nita Sells, MD [facility] To contact the attending provider between 7A-7P or the covering provider during after hours 7P-7A, please log into the web site [email or web address] and access using universal [place] password for that web site. If you do not have the password, please call the hospital operator.  03/27/2020, 7:30 AM

## 2020-03-27 NOTE — Progress Notes (Signed)
Took patient to the bathroom to void.  Patient was unsuccessful at voiding in the nun's cap for specimen.  Will reattempt to collect when patient voids again.

## 2020-03-28 LAB — CBC WITH DIFFERENTIAL/PLATELET
Abs Immature Granulocytes: 0.2 10*3/uL — ABNORMAL HIGH (ref 0.00–0.07)
Basophils Absolute: 0 10*3/uL (ref 0.0–0.1)
Basophils Relative: 0 %
Eosinophils Absolute: 0.2 10*3/uL (ref 0.0–0.5)
Eosinophils Relative: 1 %
HCT: 28.1 % — ABNORMAL LOW (ref 36.0–46.0)
Hemoglobin: 8.5 g/dL — ABNORMAL LOW (ref 12.0–15.0)
Immature Granulocytes: 2 %
Lymphocytes Relative: 13 %
Lymphs Abs: 1.7 10*3/uL (ref 0.7–4.0)
MCH: 23.5 pg — ABNORMAL LOW (ref 26.0–34.0)
MCHC: 30.2 g/dL (ref 30.0–36.0)
MCV: 77.6 fL — ABNORMAL LOW (ref 80.0–100.0)
Monocytes Absolute: 1 10*3/uL (ref 0.1–1.0)
Monocytes Relative: 7 %
Neutro Abs: 10.4 10*3/uL — ABNORMAL HIGH (ref 1.7–7.7)
Neutrophils Relative %: 77 %
Platelets: 246 10*3/uL (ref 150–400)
RBC: 3.62 MIL/uL — ABNORMAL LOW (ref 3.87–5.11)
RDW: 20.6 % — ABNORMAL HIGH (ref 11.5–15.5)
WBC: 13.5 10*3/uL — ABNORMAL HIGH (ref 4.0–10.5)
nRBC: 0.4 % — ABNORMAL HIGH (ref 0.0–0.2)

## 2020-03-28 LAB — MAGNESIUM: Magnesium: 1.9 mg/dL (ref 1.7–2.4)

## 2020-03-28 LAB — COMPREHENSIVE METABOLIC PANEL
ALT: 76 U/L — ABNORMAL HIGH (ref 0–44)
AST: 25 U/L (ref 15–41)
Albumin: 3.2 g/dL — ABNORMAL LOW (ref 3.5–5.0)
Alkaline Phosphatase: 61 U/L (ref 38–126)
Anion gap: 15 (ref 5–15)
BUN: 70 mg/dL — ABNORMAL HIGH (ref 8–23)
CO2: 27 mmol/L (ref 22–32)
Calcium: 8.7 mg/dL — ABNORMAL LOW (ref 8.9–10.3)
Chloride: 96 mmol/L — ABNORMAL LOW (ref 98–111)
Creatinine, Ser: 1.78 mg/dL — ABNORMAL HIGH (ref 0.44–1.00)
GFR, Estimated: 27 mL/min — ABNORMAL LOW (ref >60)
Glucose, Bld: 120 mg/dL — ABNORMAL HIGH (ref 70–99)
Potassium: 3.2 mmol/L — ABNORMAL LOW (ref 3.5–5.1)
Sodium: 138 mmol/L (ref 135–145)
Total Bilirubin: 0.8 mg/dL (ref 0.3–1.2)
Total Protein: 6.1 g/dL — ABNORMAL LOW (ref 6.5–8.1)

## 2020-03-28 LAB — URINALYSIS, ROUTINE W REFLEX MICROSCOPIC
Bilirubin Urine: NEGATIVE
Glucose, UA: NEGATIVE mg/dL
Ketones, ur: NEGATIVE mg/dL
Nitrite: NEGATIVE
Protein, ur: NEGATIVE mg/dL
Specific Gravity, Urine: 1.009 (ref 1.005–1.030)
pH: 6 (ref 5.0–8.0)

## 2020-03-28 MED ORDER — METOPROLOL SUCCINATE ER 25 MG PO TB24: 12.5000 mg | ORAL_TABLET | Freq: Every day | ORAL | 3 refills | Status: AC

## 2020-03-28 MED ORDER — FUROSEMIDE 40 MG PO TABS: 40.0000 mg | ORAL_TABLET | Freq: Two times a day (BID) | ORAL | 2 refills | Status: AC

## 2020-03-28 MED ORDER — NICOTINE 14 MG/24HR TD PT24: 14.0000 mg | MEDICATED_PATCH | Freq: Every day | TRANSDERMAL | 0 refills | Status: AC

## 2020-03-28 MED ORDER — MENTHOL 3 MG MT LOZG: 1.0000 | LOZENGE | OROMUCOSAL | 12 refills | Status: AC | PRN

## 2020-03-28 NOTE — Discharge Summary (Signed)
Physician Discharge Summary  [name] YDX:[id number] DOB: 03-Jul-1945 DOA: 03/21/2020  PCP: Patient, No Pcp Per  Admit date: 03/21/2020 Discharge date: 03/28/2020  Time spent: 30 minutes  Recommendations for Outpatient Follow-up:  1. Needs renal panel, CBC 1 week 2. Chest x-ray needed in 1 month 3. Please screen in the outpatient setting for anxiety as well as other issues 4. Needs to continue medications as per below note dosage changes of metoprolol and discontinuation of Aldactone this admission 5. Please reemphasized smoking cessation 6. Consider outpatient mitral valve evaluation and possible MitraClip procedure? 7. Consider goals of care if recurrent admissions  Discharge Diagnoses:  Principal Problem:   Acute on chronic combined systolic and diastolic CHF (congestive heart failure) (HCC) Active Problems:   Mitral regurgitation   Hypertension   Coronary atherosclerosis   COPD exacerbation (HCC)   COPD (chronic obstructive pulmonary disease) (HCC)   Elevated LFTs   AKI (acute kidney injury) ([place])   Discharge Condition: Improved  Diet recommendation: Heart healthy  Filed Weights   03/25/20 1416 03/27/20 0511 03/28/20 0409  Weight: 66.6 kg 64.8 kg 64 kg    History of present illness:  74 year old white female HFrEF EF 20% based on TTE 12/26/2019-recent cardiac cath 4, 10/2019 with aneurysm proximal mid RCA, HTN, HLD, COPD and is a continuous smoker Prior PCI X1 07/15/2008 with upper GI bleed 11/25/2009 and was taken off temporarily Plavix and aspirin Left CEA 03/11/2009 Admitted 12/04/2019-12/09/2019 with decompensated systolic heart failure  Readmit 03/21/2020 worsening shortness of breath-in ED placed on BiPAP-he was noncompliant with some medications Tachypneic on admission BNP >4500 WBC 16 troponin 81-->90 Covid negative CXR pulmonary edema  Patient Rx with Lasix, cardiology also consulted  Hospital Course:  1. Acute hypoxic respiratory failure secondary  to AECHF, AE COPD a. 76 liters, weight 66-->64 kg, cardiology = Lasix 40 p.o. twice daily, Toprol-XL 12.5 daily b. Need  OP HF clinic visit versus EP eval for CRT c. Transferring to floor later 2. Acute on chronic diastolic/systolic heart failure secondary to noncompliance a. As above per cardiology 3. Prior severe mitral regurg a. Will need discussion of the same and possible repeat echo to be repeated and discussed in the outpatient setting 4. AKI on admission a. Creatinine trending down diuretics ongoing b. Try to maintain negative balance 5. Leukocytosis with possible underlying pneumonia a. Chest x-ray on showed atelectasis no new issues b. We will probably be able to discharge home but needs x-ray in about a month 6. CAD status post stent 2010 a. Continue aspirin 81 daily  Procedures:  None  Consultations:  Cardiology  Discharge Exam: Vitals:   03/28/20 0712 03/28/20 1319  BP:    Pulse:    Resp:    Temp:    SpO2: 98% 97%    General: Awake alert coherent no distress EOMI NCAT Pleasant mild anxiety Cardiovascular: S1-S2 no murmur no rub no gallop RRR PVCs on monitor Respiratory: Mild rales no rhonchi Abdomen obese nontender no distention Neurologically intact  Discharge Instructions   Discharge Instructions    Diet - low sodium heart healthy   Complete by: As directed    Discharge instructions   Complete by: As directed    Look at your medications carefully as certain ones including your Aldactone and metoprolol have changed and t either have been discontinued or dosage has changed Follow with ur regular dr in the OP setting I would recommend that you get a chest x-ray probably in about 1 month's time  to ensure clearing of your lungs   Increase activity slowly   Complete by: As directed      Allergies as of 03/28/2020      Reactions   Gabapentin Swelling      Medication List    STOP taking these medications   clopidogrel 75 MG  tablet Commonly known as: PLAVIX   potassium chloride 10 MEQ tablet Commonly known as: KLOR-CON   spironolactone 25 MG tablet Commonly known as: ALDACTONE   traZODone 100 MG tablet Commonly known as: DESYREL     TAKE these medications   albuterol 108 (90 Base) MCG/ACT inhaler Commonly known as: VENTOLIN HFA Inhale 2 puffs into the lungs every 6 (six) hours as needed for wheezing or shortness of breath.   aspirin 81 MG tablet Take 81 mg by mouth daily.   furosemide 40 MG tablet Commonly known as: LASIX Take 1 tablet (40 mg total) by mouth 2 (two) times daily.   menthol-cetylpyridinium 3 MG lozenge Commonly known as: CEPACOL Take 1 lozenge (3 mg total) by mouth as needed for sore throat.   metoprolol succinate 25 MG 24 hr tablet Commonly known as: TOPROL-XL Take 0.5 tablets (12.5 mg total) by mouth daily. Start taking on: March 29, 2020 What changed: how much to take   nicotine 14 mg/24hr patch Commonly known as: NICODERM CQ - dosed in mg/24 hours Place 1 patch (14 mg total) onto the skin daily. Start taking on: March 29, 2020   simvastatin 80 MG tablet Commonly known as: ZOCOR Take 80 mg by mouth at bedtime.   traMADol 50 MG tablet Commonly known as: ULTRAM Take 50 mg by mouth every 6 (six) hours as needed.      Allergies  Allergen Reactions  . Gabapentin Swelling    Follow-up Information    Imogene Burn, PA-C Follow up on 04/03/2020.   Specialty: Cardiology Why: Cardiology Hospital Follow-up on 04/03/2020 at 12:45 PM. Please call the office if needing to switch to a Phone visit.  Contact information: [place] [address] [phone]                The results of significant diagnostics from this hospitalization (including imaging, microbiology, ancillary and laboratory) are listed below for reference.    Significant Diagnostic Studies: DG Chest Port 1 View  Result Date: 03/27/2020 CLINICAL DATA:  Hemoptysis for 2 days  EXAM: PORTABLE CHEST 1 VIEW COMPARISON:  03/26/2020 FINDINGS: Cardiac shadow remains enlarged. Aortic calcifications are again noted. Lungs again are hyperinflated. Mild bibasilar atelectasis is seen. No focal confluent infiltrate is seen. No sizable effusion is noted. IMPRESSION: COPD with mild bibasilar atelectasis. Electronically Signed   By: Inez Catalina M.D.   On: 03/27/2020 20:04   DG CHEST PORT 1 VIEW  Result Date: 03/26/2020 CLINICAL DATA:  Shortness of breath and wheezing. Cough. History of left mastectomy. EXAM: PORTABLE CHEST 1 VIEW COMPARISON:  03/21/2020 12/07/2019.  12/04/2019. FINDINGS: Mediastinum hilar structures are stable. Stable severe cardiomegaly no pulmonary venous congestion bilateral peribronchial cuffing noted, bronchitis cannot be excluded. Mild bibasilar atelectasis and or scarring again noted. Stable biapical scratched it stable basilar pleural thickening consistent with scarring. No pleural effusion or pneumothorax. Left mastectomy. No acute bony abnormality. Thoracic spine scoliosis and degenerative change again noted. IMPRESSION: 1. Peribronchial cuffing noted bilaterally, bronchitis cannot be excluded. 2. Mild bibasilar atelectasis and or scarring again noted. No interim change. No acute infiltrate noted. 3. Stable severe cardiomegaly.  No pulmonary venous congestion.  Electronically Signed   By: Marcello Moores  Register   On: 03/26/2020 05:47   DG Chest Portable 1 View  Result Date: 03/21/2020 CLINICAL DATA:  Shortness of breath, increased shortness of breath with COPD EXAM: PORTABLE CHEST 1 VIEW COMPARISON:  December 07, 2019 FINDINGS: Trachea midline. Cardiomediastinal contours remain markedly enlarged. Configuration of the heart raising the question of pericardial effusion though not significantly changed from prior study. Aortic atherosclerosis in the aortic arch. Partially obscured LEFT hemidiaphragm. Increased interstitial markings without lobar consolidation. Limited assessment  of skeletal structures without acute process. IMPRESSION: 1. Marked enlargement of the cardiomediastinal contours with findings of pulmonary edema and possible LEFT effusion. 2. Partially obscured LEFT hemidiaphragm may represent atelectasis or developing infiltrate. 3. Cardiac configuration raising the question of pericardial effusion though not significantly changed when compared to the previous exam. Electronically Signed   By: Zetta Bills M.D.   On: 03/21/2020 10:27   ECHOCARDIOGRAM COMPLETE  Result Date: 03/21/2020    ECHOCARDIOGRAM REPORT   Patient Name:   [name] Date of Exam: 03/21/2020 Medical Rec #:  [id number]        Height:       66.5 in Accession #:    [id number]       Weight:       137.1 lb Date of Birth:  08-21-1945        BSA:          1.713 m Patient Age:    26 years         BP:           106/74 mmHg Patient Gender: F                HR:           92 bpm. Exam Location:  [facility] Procedure: Cardiac Doppler, Color Doppler and 2D Echo Indications:    Acute systolic chf. Cxr mentions concern for pericardial                 effusion  History:        Patient has prior history of Echocardiogram examinations, most                 recent 12/27/2019. COPD; Risk Factors:Dyslipidemia and Current                 Smoker. Acute respiratory failure, MITRAL REGURGITATION.  Sonographer:    Leavy Cella RDCS (AE) Referring Phys: [id number] [place]  1. Images are limited.  2. Left ventricular ejection fraction, by estimation, is approximately 20%. The left ventricle has severely decreased function. The left ventricle demonstrates global hypokinesis with some regional variation. Left ventricular diastolic parameters are consistent with Grade II diastolic dysfunction (pseudonormalization).  3. Right ventricular systolic function is moderately reduced. The right ventricular size is normal. There is moderately elevated pulmonary artery systolic pressure. The estimated right ventricular  systolic pressure is 62.5 mmHg.  4. Left atrial size was mild to moderately dilated.  5. Right atrial size was mildly dilated.  6. The mitral valve is abnormal. Moderate to severe mitral valve regurgitation. Moderate mitral annular calcification.  7. The aortic valve is tricuspid. There is mild calcification of the aortic valve. Aortic valve regurgitation is mild.  8. The inferior vena cava is dilated in size with >50% respiratory variability, suggesting right atrial pressure of 8 mmHg. FINDINGS  Left Ventricle: Left ventricular ejection fraction, by estimation, is 20%. The left ventricle has severely decreased  function. The left ventricle demonstrates global hypokinesis. The left ventricular internal cavity size was normal in size. There is no left ventricular hypertrophy. Abnormal (paradoxical) septal motion, consistent with left bundle branch block. Left ventricular diastolic parameters are consistent with Grade II diastolic dysfunction (pseudonormalization). Right Ventricle: The right ventricular size is normal. No increase in right ventricular wall thickness. Right ventricular systolic function is moderately reduced. There is moderately elevated pulmonary artery systolic pressure. The tricuspid regurgitant velocity is 3.27 m/s, and with an assumed right atrial pressure of 8 mmHg, the estimated right ventricular systolic pressure is 02.5 mmHg. Left Atrium: Left atrial size was mild to moderately dilated. Right Atrium: Right atrial size was mildly dilated. Pericardium: There is no evidence of pericardial effusion. Mitral Valve: The mitral valve is abnormal. Moderate mitral annular calcification. Moderate to severe mitral valve regurgitation. Tricuspid Valve: The tricuspid valve is grossly normal. Tricuspid valve regurgitation is mild. Aortic Valve: The aortic valve is tricuspid. There is mild calcification of the aortic valve. Aortic valve regurgitation is mild. Pulmonic Valve: The pulmonic valve was not well  visualized. Pulmonic valve regurgitation is trivial. Aorta: The aortic root is normal in size and structure. Venous: The inferior vena cava is dilated in size with greater than 50% respiratory variability, suggesting right atrial pressure of 8 mmHg. IAS/Shunts: No atrial level shunt detected by color flow Doppler.  LEFT VENTRICLE PLAX 2D LVIDd:         5.07 cm  Diastology LVIDs:         4.40 cm  LV e' medial:    4.68 cm/s LV PW:         1.25 cm  LV E/e' medial:  20.8 LV IVS:        1.31 cm  LV e' lateral:   7.88 cm/s LVOT diam:     1.90 cm  LV E/e' lateral: 12.3 LVOT Area:     2.84 cm  RIGHT VENTRICLE RV S prime:     11.50 cm/s LEFT ATRIUM             Index       RIGHT ATRIUM           Index LA diam:        3.70 cm 2.16 cm/m  RA Area:     21.00 cm LA Vol (A2C):   96.8 ml 56.51 ml/m RA Volume:   64.00 ml  37.36 ml/m LA Vol (A4C):   47.1 ml 27.50 ml/m LA Biplane Vol: 70.0 ml 40.87 ml/m   AORTA Ao Root diam: 2.70 cm MITRAL VALVE                 TRICUSPID VALVE MV Area (PHT): 5.34 cm      TR Peak grad:   42.8 mmHg MV Decel Time: 142 msec      TR Vmax:        327.00 cm/s MR Peak grad:    101.6 mmHg MR Mean grad:    60.0 mmHg   SHUNTS MR Vmax:         504.00 cm/s Systemic Diam: 1.90 cm MR Vmean:        357.0 cm/s MR PISA:         3.08 cm MR PISA Eff ROA: 19 mm MR PISA Radius:  0.70 cm MV E velocity: 97.30 cm/s MV A velocity: 52.30 cm/s MV E/A ratio:  1.86 Rozann Lesches MD Electronically signed by Rozann Lesches MD Signature Date/Time: 03/21/2020/4:39:46 PM    Final  US Abdomen Limited RUQ  Result Date: 03/21/2020 CLINICAL DATA:  Increased LFTs. EXAM: ULTRASOUND ABDOMEN LIMITED RIGHT UPPER QUADRANT COMPARISON:  None. FINDINGS: Gallbladder: Status post cholecystectomy. Common bile duct: Diameter: 11 mm, which is dilated. Liver: No focal lesion identified, although evaluation is limited secondary to poor sonographic window. Mildly increased echogenicity of the liver diffusely. Portal vein is patent on  color Doppler imaging with normal direction of blood flow towards the liver. Other: Right pleural effusion. IMPRESSION: 1. The proximal common bile duct is dilated, measuring 11 mm. No evidence of proximal obstructing lesion; however, the distal common bile duct is not well evaluated sonographically. If there is concern for obstruction, MRI/MRCP or CT could further evaluate. 2. Mildly increased echogenicity of the liver diffusely, which may relate to hepatic steatosis or chronic hepatic disease. 3. Right pleural effusion. 4. Status post cholecystectomy. Electronically Signed   By: Margaretha Sheffield MD   On: 03/21/2020 13:27    Microbiology: Recent Results (from the past 240 hour(s))  Respiratory Panel by RT PCR (Flu A&B, Covid) - Nasopharyngeal Swab     Status: None   Collection Time: 03/21/20  9:53 AM   Specimen: Nasopharyngeal Swab  Result Value Ref Range Status   SARS Coronavirus 2 by RT PCR NEGATIVE NEGATIVE Final    Comment: (NOTE) SARS-CoV-2 target nucleic acids are NOT DETECTED.  The SARS-CoV-2 RNA is generally detectable in upper respiratoy specimens during the acute phase of infection. The lowest concentration of SARS-CoV-2 viral copies this assay can detect is 131 copies/mL. A negative result does not preclude SARS-Cov-2 infection and should not be used as the sole basis for treatment or other patient management decisions. A negative result may occur with  improper specimen collection/handling, submission of specimen other than nasopharyngeal swab, presence of viral mutation(s) within the areas targeted by this assay, and inadequate number of viral copies (<131 copies/mL). A negative result must be combined with clinical observations, patient history, and epidemiological information. The expected result is Negative.  Fact Sheet for Patients:  PinkCheek.be  Fact Sheet for Healthcare Providers:  GravelBags.it  This test  is no t yet approved or cleared by the [place] FDA and  has been authorized for detection and/or diagnosis of SARS-CoV-2 by FDA under an Emergency Use Authorization (EUA). This EUA will remain  in effect (meaning this test can be used) for the duration of the COVID-19 declaration under Section 564(b)(1) of the Act, 21 U.S.C. section 360bbb-3(b)(1), unless the authorization is terminated or revoked sooner.     Influenza A by PCR NEGATIVE NEGATIVE Final   Influenza B by PCR NEGATIVE NEGATIVE Final    Comment: (NOTE) The Xpert Xpress SARS-CoV-2/FLU/RSV assay is intended as an aid in  the diagnosis of influenza from Nasopharyngeal swab specimens and  should not be used as a sole basis for treatment. Nasal washings and  aspirates are unacceptable for Xpert Xpress SARS-CoV-2/FLU/RSV  testing.  Fact Sheet for Patients: PinkCheek.be  Fact Sheet for Healthcare Providers: GravelBags.it  This test is not yet approved or cleared by the [place] FDA and  has been authorized for detection and/or diagnosis of SARS-CoV-2 by  FDA under an Emergency Use Authorization (EUA). This EUA will remain  in effect (meaning this test can be used) for the duration of the  Covid-19 declaration under Section 564(b)(1) of the Act, 21  U.S.C. section 360bbb-3(b)(1), unless the authorization is  terminated or revoked. Performed at [facility], [address] [place], [place] [address]  Labs: Basic Metabolic Panel: Recent Labs  Lab 03/24/20 0534 03/24/20 0534 03/25/20 0747 03/25/20 1129 03/26/20 0636 03/27/20 0423 03/28/20 0635  NA 131*   < > 134* 135 134* 137 138  K 5.2*   < > 4.2 4.1 4.0 3.8 3.2*  CL 98   < > 96* 95* 97* 97* 96*  CO2 21*   < > 24 24 24 24 27   GLUCOSE 131*   < > 117* 105* 133* 121* 120*  BUN 78*   < > 82* 81* 81* 81* 70*  CREATININE 3.35*   < > 2.67* 2.67* 2.50* 2.21* 1.78*  CALCIUM 8.4*   < > 8.4* 8.6* 8.6*  8.8* 8.7*  MG 2.5*  --  2.5*  --  2.5* 2.3 1.9   < > = values in this interval not displayed.   Liver Function Tests: Recent Labs  Lab 03/22/20 0315 03/23/20 0913 03/24/20 0534 03/25/20 1129 03/28/20 0635  AST 519* 166* 100* 66* 25  ALT 365* 292* 243* 191* 76*  ALKPHOS 95 83 82 83 61  BILITOT 0.8 0.7 0.2* 0.8 0.8  PROT 6.5 6.2* 6.4* 6.6 6.1*  ALBUMIN 3.4* 3.4* 3.4* 3.6 3.2*   No results for input(s): LIPASE, AMYLASE in the last 168 hours. No results for input(s): AMMONIA in the last 168 hours. CBC: Recent Labs  Lab 03/23/20 0913 03/24/20 0534 03/25/20 0747 03/27/20 0423 03/28/20 0635  WBC 18.3* 11.9* 12.6* 13.2* 13.5*  NEUTROABS 14.6* 10.1* 10.3* 10.6* 10.4*  HGB 9.5* 9.1* 9.3* 9.0* 8.5*  HCT 32.6* 29.4* 30.1* 30.0* 28.1*  MCV 81.3 77.8* 78.2* 78.9* 77.6*  PLT 200 157 163 234 246   Cardiac Enzymes: No results for input(s): CKTOTAL, CKMB, CKMBINDEX, TROPONINI in the last 168 hours. BNP: BNP (last 3 results) Recent Labs    12/08/19 0825 03/21/20 0954 03/25/20 1129  BNP 2,850.0* >4,500.0* 2,397.0*    ProBNP (last 3 results) No results for input(s): PROBNP in the last 8760 hours.  CBG: No results for input(s): GLUCAP in the last 168 hours.     Signed:  Nita Sells MD   [facility] 03/28/2020, 2:29 PM

## 2020-03-28 NOTE — Progress Notes (Signed)
Physical Therapy Treatment Patient Details Name: [name] MRN: [id number] DOB: 08-01-1945 Today's Date: 03/28/2020    History of Present Illness Ms. Cavey is a 74 year old female with medical history significant for chronic systolic CHF with EF of 80% (TTE on 12/2019), HTN, HLD, COPD who presented to the ED on 10/14 with complaints of worsening shortness of breath over several weeks but particular over the last few days.  Patient unable to provide much history as she is currently receiving BiPAP mask during examination and review of chart and discussing with the ED provider patient's daughter called EMS for shortness of breath.  At that time EMS placed patient on CPAP with good response and noted oxygen saturation in the 90s.    PT Comments    Patient on 3.5 LPM O2 today. Reluctantly agreeable to participating  In Physical Therapy today. Min guard to min assist for all mobility, except modified independent with bed mobility. Daughter present in room throughout session. Patient completed short distance ambulation to room door and back to bed with one hand held limited by complaints of fatigue in legs and shortness of breath/dyspnea on exertion. Patient began reaching for objects on return trip from door. Patient performed 5 times sit-to-stand transfers with decreased pace and requiring short sitting and standing rest breaks to complete 5 while on 3.5 LPM.   Follow Up Recommendations  No PT follow up     Equipment Recommendations  None recommended by PT    Recommendations for Other Services       Precautions / Restrictions Precautions Precautions: Fall Restrictions Weight Bearing Restrictions: No    Mobility  Bed Mobility Overal bed mobility: Modified Independent  General bed mobility comments: HOB elevated  Transfers Overall transfer level: Needs assistance Equipment used: None Transfers: Sit to/from Stand Sit to Stand: Min guard    Ambulation/Gait Ambulation/Gait  assistance: Herbalist (Feet): 20 Feet Assistive device: 1 person hand held assist Gait Pattern/deviations: Step-through pattern;Decreased step length - right;Decreased step length - left;Decreased stride length Gait velocity: decreased   General Gait Details: slow, labored gait, somewhat unsteady requiring one hand held assist with reaching for objects on return trip from room door due to complaints of leg fatigue and shortness of breath; limited by fatigue; 4 LPM O2   Stairs     Wheelchair Mobility    Modified Rankin (Stroke Patients Only)       Balance Overall balance assessment: Needs assistance Sitting-balance support: Feet supported;Bilateral upper extremity supported Sitting balance-Leahy Scale: Fair     Standing balance support: Single extremity supported;During functional activity Standing balance-Leahy Scale: Fair Standing balance comment: fair with one hand held       Cognition Arousal/Alertness: Awake/alert Behavior During Therapy: WFL for tasks assessed/performed;Anxious Overall Cognitive Status: Within Functional Limits for tasks assessed           Exercises Other Exercises Other Exercises: sit to stand x5    General Comments        Pertinent Vitals/Pain Pain Assessment: No/denies pain    Home Living       Prior Function        PT Goals (current goals can now be found in the care plan section) Acute Rehab PT Goals Patient Stated Goal: Return home PT Goal Formulation: With patient Time For Goal Achievement: 04/05/20 Potential to Achieve Goals: Good Progress towards PT goals: Progressing toward goals    Frequency    Min 3X/week      PT Plan Current plan  remains appropriate       AM-PAC PT "6 Clicks" Mobility   Outcome Measure  Help needed turning from your back to your side while in a flat bed without using bedrails?: None Help needed moving from lying on your back to sitting on the side of a flat bed without  using bedrails?: None Help needed moving to and from a bed to a chair (including a wheelchair)?: A Little Help needed standing up from a chair using your arms (e.g., wheelchair or bedside chair)?: A Little Help needed to walk in hospital room?: A Little Help needed climbing 3-5 steps with a railing? : A Little 6 Click Score: 20    End of Session Equipment Utilized During Treatment: Oxygen Activity Tolerance: Patient tolerated treatment well Patient left: in bed;with call bell/phone within reach;with bed alarm set;with family/visitor present Nurse Communication: Mobility status PT Visit Diagnosis: Other abnormalities of gait and mobility (R26.89);Muscle weakness (generalized) (M62.81)     Time: 5993-5701 PT Time Calculation (min) (ACUTE ONLY): 23 min  Charges:  $Gait Training: 8-22 mins $Therapeutic Activity: 8-22 mins                     Floria Raveling. Hartnett-Rands, MS, PT Per [facility] #[id number] 03/28/2020, 1:31 PM

## 2020-03-28 NOTE — Care Management Important Message (Signed)
Important Message  Patient Details  Name: [name] MRN: [id number] Date of Birth: 07-03-1945   Medicare Important Message Given:  Yes Joesph July ,RN agreed to deliver letter)     Tommy Medal 03/28/2020, 3:12 PM

## 2020-04-01 NOTE — Progress Notes (Deleted)
Cardiology Office Note    Date:  04/01/2020   ID:  Carolyn, Mitchell 1946/03/02, MRN [id number]  PCP:  Patient, No Pcp Per  Cardiologist: Carlyle Dolly, MD EPS: None  No chief complaint on file.   History of Present Illness:  [name] is a 74 y.o. female with history of chronic systolic CHF/NICM (EF 53% by echocardiogram in 09/2019), CAD (s/p BMS to RCA in 2010, cath in 10/2019 showing mild to moderate disease and patent RCA stent with minimal ISR with reduction in LV function out of proportion to the degree of CAD), HTN, HLD, carotid artery stenosis (s/p L CEA in 2010), moderate to severe MR, COPD and history of breast cancer.  Patient was discharged 03/28/2020 with CHF exacerbation due to not taking her meds for 2 weeks.  He diuresed over 6 L and low normal blood pressure precludes further addition of medicines.  Hopeful to resume Aldactone as outpatient presuming renal function improves blood pressure stabilizes.  Discussed referral to heart failure clinic and possible EP evaluation for CRT with left bundle branch block   Past Medical History:  Diagnosis Date  . Breast cancer ([place])   . Carotid artery stenosis   . CHF (congestive heart failure) ([facility])    a. EF 20% by echocardiogram in 09/2019  . COPD (chronic obstructive pulmonary disease) ([place])   . Coronary artery disease    a. s/p BMS to RCA in 2010 b. cath in 10/2019 showing mild to moderate disease and patent RCA stent with minimal ISR with reduction in LV function out of proportion to the degree of CAD)  . Depression   . Enlargement of lymph node   . GERD (gastroesophageal reflux disease)   . Hyperlipidemia   . Hypertension   . LBP (low back pain)   . Mitral regurgitation   . Osteoarthritis   . Sciatica   . Systolic congestive heart failure ([place])   . Tobacco user   . Urge urinary incontinence     Past Surgical History:  Procedure Laterality Date  . APPENDECTOMY     as a child  . BACK SURGERY  1995   . bare-metal stenting of critical mid ICA stenosis     w/ ulcerated plaque/thrombus  . CAROTID ENDARTERECTOMY    . [name]  . CHOLECYSTECTOMY  1980s  . MASTECTOMY  1992   L  . RIGHT/LEFT HEART CATH AND CORONARY ANGIOGRAPHY N/A 10/09/2019   Procedure: RIGHT/LEFT HEART CATH AND CORONARY ANGIOGRAPHY;  Surgeon: Nelva Bush, MD;  Location: [facility];  Service: Cardiovascular;  Laterality: N/A;    Current Medications: No outpatient medications have been marked as taking for the 04/03/20 encounter (Appointment) with Imogene Burn, PA-C.     Allergies:   Gabapentin   Social History   Socioeconomic History  . Marital status: Married    Spouse name: [name]  . Number of children: Y  . Years of education: Not on file  . Highest education level: Not on file  Occupational History  . 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  Tobacco Use  . Smoking status: Current Every Day Smoker    Packs/day: 1.00    Years: 51.00    Pack years: 51.00    Types: Cigarettes  . Smokeless tobacco: Never Used  Substance and Sexual Activity  . Alcohol use: No  . Drug use: No  .  Sexual activity: Not on file  Other Topics Concern  . Not on file  Social History Narrative   Lives with daughter, [name] and [name] and her husband, [name].   Social Determinants of Health   Financial Resource Strain:   . Difficulty of Paying Living Expenses: Not on file  Food Insecurity:   . Worried About Charity fundraiser in the Last Year: Not on file  . Ran Out of Food in the Last Year: Not on file  Transportation Needs:   . Lack of Transportation (Medical): Not on file  . Lack of Transportation (Non-Medical): Not on file  Physical Activity:   . Days of Exercise per Week: Not on file  . Minutes of Exercise per Session: Not on file  Stress:   . Feeling of Stress : Not on file  Social  Connections:   . Frequency of Communication with Friends and Family: Not on file  . Frequency of Social Gatherings with Friends and Family: Not on file  . Attends Religious Services: Not on file  . Active Member of Clubs or Organizations: Not on file  . Attends Archivist Meetings: Not on file  . Marital Status: Not on file     Family History:  The patient's ***family history includes Diabetes in her mother; Heart attack in her mother; Heart disease in her brother; Hypertension in her mother; Lung cancer in her father; Stroke in her brother and father.   ROS:   Please see the history of present illness.    ROS All other systems reviewed and are negative.   PHYSICAL EXAM:   VS:  There were no vitals taken for this visit.  Physical Exam  GEN: Well nourished, well developed, in no acute distress  HEENT: normal  Neck: no JVD, carotid bruits, or masses Cardiac:RRR; no murmurs, rubs, or gallops  Respiratory:  clear to auscultation bilaterally, normal work of breathing GI: soft, nontender, nondistended, + BS Ext: without cyanosis, clubbing, or edema, Good distal pulses bilaterally MS: no deformity or atrophy  Skin: warm and dry, no rash Neuro:  Alert and Oriented x 3, Strength and sensation are intact Psych: euthymic mood, full affect  Wt Readings from Last 3 Encounters:  03/28/20 141 lb 1.5 oz (64 kg)  12/09/19 137 lb 1.6 oz (62.2 kg)  10/10/19 136 lb 4.8 oz (61.8 kg)      Studies/Labs Reviewed:   EKG:  EKG is*** ordered today.  The ekg ordered today demonstrates ***  Recent Labs: 03/21/2020: TSH 1.814 03/25/2020: B Natriuretic Peptide 2,397.0 03/28/2020: ALT 76; BUN 70; Creatinine, Ser 1.78; Hemoglobin 8.5; Magnesium 1.9; Platelets 246; Potassium 3.2; Sodium 138   Lipid Panel    Component Value Date/Time   CHOL 107 10/07/2019 0719   TRIG 74 10/07/2019 0719   HDL 32 (L) 10/07/2019 0719   CHOLHDL 3.3 10/07/2019 0719   VLDL 15 10/07/2019 0719   LDLCALC 60  10/07/2019 0719   LDLDIRECT 94 12/30/2007 2059    Additional studies/ records that were reviewed today include:  Cardiac Catheterization: 10/2019 Conclusions: 1. Mild to moderate, non-obstructive coronary artery disease, including 15% ostial LMCA, 20% proximal and mid LAD, and 40% mid LCx stenoses, as well as 50% proximal and 30% mid/distal RCA lesions. 2. 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  The aneurysm appears larger compared with 2010. 3. Patent stent in the mid RCA with minimal in-stent restenosis. 4. Normal right and left heart filling pressures. 5.  Mild pulmonary hypertension. 6. Normal Fick cardiac output/index.   Recommendations: 1. Optimize evidence-based medical therapy for acute systolic heart failure.  Reduction in left ventricular ejection fraction is out of proportion to the degree of coronary artery disease, consistent with non-ischemic cardiomyopathy. 2. Aggressive secondary prevention of coronary artery disease.     Echocardiogram: 03/21/2020 IMPRESSIONS     1. Images are limited.   2. Left ventricular ejection fraction, by estimation, is approximately  20%. The left ventricle has severely decreased function. The left  ventricle demonstrates global hypokinesis with some regional variation.  Left ventricular diastolic parameters are  consistent with Grade II diastolic dysfunction (pseudonormalization).   3. Right ventricular systolic function is moderately reduced. The right  ventricular size is normal. There is moderately elevated pulmonary artery  systolic pressure. The estimated right ventricular systolic pressure is  81.0 mmHg.   4. Left atrial size was mild to moderately dilated.   5. Right atrial size was mildly dilated.   6. The mitral valve is abnormal. Moderate to severe mitral valve  regurgitation. Moderate mitral annular calcification.   7. The aortic valve is tricuspid. There is mild calcification of  the  aortic valve. Aortic valve regurgitation is mild.   8. The inferior vena cava is dilated in size with >50% respiratory  variability, suggesting right atrial pressure of 8 mmHg.        ASSESSMENT:    No diagnosis found.   PLAN:  In order of problems listed above:  NICM EF 20% echo 09/09/19 on entresto, coreg, aldactone, lasix.  CAD-BMS RCA 2004, cath 10/09/19 nonobstructive CAD medical therapy on Plavix and ASA  Chronic systolic CHF  HTN  HLD  Tobacco abuse     Medication Adjustments/Labs and Tests Ordered: Current medicines are reviewed at length with the patient today.  Concerns regarding medicines are outlined above.  Medication changes, Labs and Tests ordered today are listed in the Patient Instructions below. There are no Patient Instructions on file for this visit.   Sumner Boast, PA-C  04/01/2020 3:53 PM    [facility] [place], [place], [place]  [address] Phone: [phone]; Fax: [phone]

## 2020-04-03 ENCOUNTER — Ambulatory Visit: Payer: Medicare Other | Admitting: Physician Assistant

## 2020-11-06 DEATH — deceased
